# Patient Record
Sex: Female | Born: 1960 | Race: White | Hispanic: No | Marital: Married | Smoking: Never smoker
Health system: Southern US, Community
[De-identification: ages and names within clinical notes are randomized; demographics above are authoritative.]

## PROBLEM LIST (undated history)

## (undated) ENCOUNTER — Encounter

## (undated) ENCOUNTER — Encounter: Attending: Internal Medicine | Primary: Internal Medicine

## (undated) ENCOUNTER — Encounter
Attending: Pharmacist Clinician (PhC)/ Clinical Pharmacy Specialist | Primary: Pharmacist Clinician (PhC)/ Clinical Pharmacy Specialist

## (undated) ENCOUNTER — Telehealth

## (undated) ENCOUNTER — Encounter: Attending: Family | Primary: Family

## (undated) ENCOUNTER — Ambulatory Visit: Payer: PRIVATE HEALTH INSURANCE | Attending: Registered" | Primary: Registered"

## (undated) ENCOUNTER — Ambulatory Visit: Payer: PRIVATE HEALTH INSURANCE

## (undated) ENCOUNTER — Ambulatory Visit: Payer: PRIVATE HEALTH INSURANCE | Attending: Family | Primary: Family

## (undated) ENCOUNTER — Ambulatory Visit: Payer: Medicaid (Managed Care) | Attending: Internal Medicine | Primary: Internal Medicine

## (undated) ENCOUNTER — Ambulatory Visit

## (undated) ENCOUNTER — Telehealth
Attending: Pharmacist Clinician (PhC)/ Clinical Pharmacy Specialist | Primary: Pharmacist Clinician (PhC)/ Clinical Pharmacy Specialist

## (undated) ENCOUNTER — Ambulatory Visit: Payer: Medicaid (Managed Care)

## (undated) ENCOUNTER — Inpatient Hospital Stay: Payer: Medicaid (Managed Care)

## (undated) ENCOUNTER — Ambulatory Visit: Attending: Surgery | Primary: Surgery

## (undated) ENCOUNTER — Ambulatory Visit: Payer: PRIVATE HEALTH INSURANCE | Attending: Clinical | Primary: Clinical

## (undated) ENCOUNTER — Encounter
Attending: Student in an Organized Health Care Education/Training Program | Primary: Student in an Organized Health Care Education/Training Program

## (undated) DIAGNOSIS — E785 Hyperlipidemia, unspecified: Secondary | ICD-10-CM

## (undated) DIAGNOSIS — G4730 Sleep apnea, unspecified: Secondary | ICD-10-CM

## (undated) DIAGNOSIS — K219 Gastro-esophageal reflux disease without esophagitis: Secondary | ICD-10-CM

## (undated) DIAGNOSIS — E559 Vitamin D deficiency, unspecified: Secondary | ICD-10-CM

## (undated) HISTORY — PX: BREAST LUMPECTOMY: SHX2

## (undated) HISTORY — PX: GANGLION CYST EXCISION: SHX1691

## (undated) HISTORY — PX: CARPAL TUNNEL RELEASE: SHX101

## (undated) HISTORY — DX: Hyperlipidemia, unspecified: E78.5

## (undated) HISTORY — DX: Sleep apnea, unspecified: G47.30

## (undated) HISTORY — PX: ELBOW SURGERY: SHX618

## (undated) HISTORY — PX: HAND SURGERY: SHX662

## (undated) HISTORY — DX: Vitamin D deficiency, unspecified: E55.9

## (undated) HISTORY — PX: SHOULDER ARTHROSCOPY: SHX128

## (undated) HISTORY — PX: KNEE SURGERY: SHX244

## (undated) HISTORY — DX: Gastro-esophageal reflux disease without esophagitis: K21.9

---

## 2004-11-01 ENCOUNTER — Emergency Department (HOSPITAL_COMMUNITY): Admission: EM | Admit: 2004-11-01 | Discharge: 2004-11-01 | Payer: Self-pay | Admitting: Emergency Medicine

## 2006-01-25 ENCOUNTER — Encounter: Admission: RE | Admit: 2006-01-25 | Discharge: 2006-01-25 | Payer: Self-pay | Admitting: *Deleted

## 2006-02-26 ENCOUNTER — Encounter (INDEPENDENT_AMBULATORY_CARE_PROVIDER_SITE_OTHER): Payer: Self-pay | Admitting: *Deleted

## 2006-02-26 ENCOUNTER — Ambulatory Visit (HOSPITAL_COMMUNITY): Admission: RE | Admit: 2006-02-26 | Discharge: 2006-02-26 | Payer: Self-pay | Admitting: *Deleted

## 2006-07-20 ENCOUNTER — Emergency Department (HOSPITAL_COMMUNITY): Admission: EM | Admit: 2006-07-20 | Discharge: 2006-07-20 | Payer: Self-pay | Admitting: Emergency Medicine

## 2006-10-25 ENCOUNTER — Ambulatory Visit (HOSPITAL_BASED_OUTPATIENT_CLINIC_OR_DEPARTMENT_OTHER): Admission: RE | Admit: 2006-10-25 | Discharge: 2006-10-25 | Payer: Self-pay | Admitting: Orthopedic Surgery

## 2007-12-23 ENCOUNTER — Ambulatory Visit: Payer: Self-pay | Admitting: Gastroenterology

## 2007-12-23 DIAGNOSIS — R1011 Right upper quadrant pain: Secondary | ICD-10-CM

## 2007-12-23 DIAGNOSIS — R740 Nonspecific elevation of levels of transaminase and lactic acid dehydrogenase [LDH]: Secondary | ICD-10-CM

## 2007-12-24 LAB — CONVERTED CEMR LAB
ALT: 37 units/L — ABNORMAL HIGH (ref 0–35)
Alkaline Phosphatase: 50 units/L (ref 39–117)
Basophils Absolute: 0.1 10*3/uL (ref 0.0–0.1)
GFR calc non Af Amer: 71 mL/min
Glucose, Bld: 108 mg/dL — ABNORMAL HIGH (ref 70–99)
HCT: 39.4 % (ref 36.0–46.0)
Hemoglobin: 13.8 g/dL (ref 12.0–15.0)
IgG (Immunoglobin G), Serum: 1003 mg/dL (ref 694–1618)
MCHC: 35 g/dL (ref 30.0–36.0)
Monocytes Absolute: 0.6 10*3/uL (ref 0.1–1.0)
Neutro Abs: 5.5 10*3/uL (ref 1.4–7.7)
RDW: 12.3 % (ref 11.5–14.6)
Saturation Ratios: 25.8 % (ref 20.0–50.0)
Sodium: 140 meq/L (ref 135–145)
Total Bilirubin: 1 mg/dL (ref 0.3–1.2)
Total Protein: 6.8 g/dL (ref 6.0–8.3)
Transferrin: 277.1 mg/dL (ref >=212.0)
aPTT: 29.7 s (ref 21.7–29.8)

## 2007-12-25 ENCOUNTER — Ambulatory Visit: Payer: Self-pay | Admitting: Cardiology

## 2008-01-02 ENCOUNTER — Telehealth (INDEPENDENT_AMBULATORY_CARE_PROVIDER_SITE_OTHER): Payer: Self-pay | Admitting: *Deleted

## 2008-01-02 LAB — CONVERTED CEMR LAB
Anti Nuclear Antibody(ANA): NEGATIVE
Ceruloplasmin: 32 mg/dL (ref 21–63)
HCV Ab: NEGATIVE
Tissue Transglutaminase Ab, IgA: 0 units (ref <7)

## 2008-03-24 ENCOUNTER — Encounter: Payer: Self-pay | Admitting: Gastroenterology

## 2009-07-21 ENCOUNTER — Emergency Department (HOSPITAL_COMMUNITY): Admission: EM | Admit: 2009-07-21 | Discharge: 2009-07-21 | Payer: Self-pay | Admitting: Emergency Medicine

## 2009-08-30 ENCOUNTER — Telehealth: Payer: Self-pay | Admitting: Gastroenterology

## 2010-04-12 NOTE — Procedures (Signed)
Summary: EGD   EGD  Procedure date:  02/26/2006  Findings:      Location: [facility]   NAME:  Brenda Lamb, Brenda Lamb                ACCOUNT NO.:  [id number]   MEDICAL RECORD NO.:  [id number]          PATIENT TYPE:  AMB   LOCATION:  ENDO                         FACILITY:  [facility]   PHYSICIAN:  Georgiana Spinner, M.D.    DATE OF BIRTH:  1960-07-07   DATE OF PROCEDURE:  02/26/2006  DATE OF DISCHARGE:                               OPERATIVE REPORT   PROCEDURE:  Upper endoscopy with biopsy.   INDICATIONS:  Gastroesophageal reflux disease, question of esophageal  varices.   ANESTHESIA:  Fentanyl 75 mcg, Versed 8 mg.   PROCEDURE IN DETAIL:  With patient mildly sedated in the left lateral  decubitus position, the Pentax videoscopic endoscope was inserted into  the mouth, passed under direct vision through the esophagus which  appeared normal.  There was no evidence of varices noted in the stomach,  fundus, body, antrum, duodenal bulb, second portion of the duodenum  visualized.  From this point, the endoscope was slowly withdrawn taking  circumferential views of the duodenal mucosa until the endoscope was  then pulled back into the stomach, placed in retroflexion and viewed the  stomach from below.  Question of some irritation and erythema of the  squamocolumnar junction was noted and biopsied in retroflex view.  The  endoscope was then straightened and withdrawn.  The patient's vital  signs, pulse oximeter, remained stable.  Patient tolerated the procedure  well without apparent complications.   FINDINGS:  No evidence of gastric or esophageal varices.  Mild erythema  of the squamocolumnar junction biopsied.  Await biopsy report.   PLAN:  Patient will call in for results and follow up with me as an  outpatient.           ______________________________  Georgiana Spinner, M.D.     GMO/MEDQ  D:  02/26/2006  T:  02/26/2006  Job:  [id number]

## 2010-04-12 NOTE — Progress Notes (Signed)
Summary: Schedule Recall Office Visit  Phone Note Outgoing Call   Call placed by: Lamona Curl CMA Duncan Dull),  August 30, 2009 3:28 PM Call placed to: Patient Summary of Call: I have called to remind patient that she is overdue for follow up office visit. Patient states that she never got our letter but would like to go ahead and schedule a visit with [place] as she is now having increasing amounts of abdominal pain again. She states that she is on Workers Comp and is not sure she can afford to come. I have given her the number to [facility], our billing department to set up arrangements with them and we have set her up for an office visit on 09/10/09. Patient to call back should symptoms persist or worsen. Initial call taken by: Lamona Curl CMA (AAMA),  August 30, 2009 3:30 PM

## 2010-07-26 NOTE — Op Note (Signed)
NAMETAMMEE, THIELKE                ACCOUNT NO.:  [id number]   MEDICAL RECORD NO.:  [id number]          PATIENT TYPE:  AMB   LOCATION:  DSC                          FACILITY:  [facility]   PHYSICIAN:  Cindee Salt, M.D.       DATE OF BIRTH:  April 16, 1960   DATE OF PROCEDURE:  10/25/2006  DATE OF DISCHARGE:                               OPERATIVE REPORT   PREOPERATIVE DIAGNOSIS:  Stenosing tenosynovitis, left thumb.   POSTOPERATIVE DIAGNOSIS:  Stenosing tenosynovitis, left thumb.   OPERATION:  Release A1 pulley to the left thumb.   SURGEON:  Cindee Salt, M.D.   ANESTHESIA:  General.   ANESTHESIOLOGIST:  Janetta Hora. Gelene Mink, M.D.   HISTORY:  The patient is a 50 year old female with a history of  triggering of her left thumb not responsive to conservative treatment.  She has elected to proceed to have this surgically released.  She is  aware of risks and complications including infection, recurrence of  injury, damage to arteries, veins, tendons, complete relief of symptoms,  dystrophy.  She has elected to proceed.  She is aware of risks and  complications which have been thoroughly discussed.  Questions have been  encouraged and answered to her satisfaction in the preoperative area.  The patient is seen and the extremity marked by both patient and  surgeon.   PROCEDURE:  The patient is brought to the operating room where general  anesthetic was carried out without difficulty.  She was prepped using  DuraPrep, supine position, left arm free.  The limb was exsanguinated  with an Esmarch bandage.  Tourniquet placed on the forearm, was inflated  to 220 mmHg.  A transverse incision was made over the A1 pulley carried  down through subcutaneous tissue and retractors placed.  After  identification of radial and ulnar neurovascular bundles.  The A1 pulley  was found to be markedly thickened with a trigger thumb.  This was  released in its radial aspect.  The oblique pulley was left intact.   The  finger placed through a full range of motion, no further triggering was  noted.  The wound was irrigated.  The skin was closed with interrupted 5-  0 Vicryl repeat sutures.  Sterile compressive dressing was applied.  The  patient tolerated the procedure well.  On deflation of the tourniquet,  all fingers immediately pink.  She was taken to the recovery room for  observation in satisfactory condition.  She will be discharged home to  return to the hand surgery clinic in 1 week on Vicodin.           ______________________________  Cindee Salt, M.D.     GK/MEDQ  D:  10/25/2006  T:  10/25/2006  Job:  [id number]

## 2010-07-29 NOTE — Op Note (Signed)
NAMEDIAMONDS, LIPPARD                ACCOUNT NO.:  [id number]   MEDICAL RECORD NO.:  [id number]          PATIENT TYPE:  AMB   LOCATION:  ENDO                         FACILITY:  [facility]   PHYSICIAN:  Georgiana Spinner, M.D.    DATE OF BIRTH:  1960-07-31   DATE OF PROCEDURE:  02/26/2006  DATE OF DISCHARGE:                               OPERATIVE REPORT   PROCEDURE:  Upper endoscopy with biopsy.   INDICATIONS:  Gastroesophageal reflux disease, question of esophageal  varices.   ANESTHESIA:  Fentanyl 75 mcg, Versed 8 mg.   PROCEDURE IN DETAIL:  With patient mildly sedated in the left lateral  decubitus position, the Pentax videoscopic endoscope was inserted into  the mouth, passed under direct vision through the esophagus which  appeared normal.  There was no evidence of varices noted in the stomach,  fundus, body, antrum, duodenal bulb, second portion of the duodenum  visualized.  From this point, the endoscope was slowly withdrawn taking  circumferential views of the duodenal mucosa until the endoscope was  then pulled back into the stomach, placed in retroflexion and viewed the  stomach from below.  Question of some irritation and erythema of the  squamocolumnar junction was noted and biopsied in retroflex view.  The  endoscope was then straightened and withdrawn.  The patient's vital  signs, pulse oximeter, remained stable.  Patient tolerated the procedure  well without apparent complications.   FINDINGS:  No evidence of gastric or esophageal varices.  Mild erythema  of the squamocolumnar junction biopsied.  Await biopsy report.   PLAN:  Patient will call in for results and follow up with me as an  outpatient.           ______________________________  Georgiana Spinner, M.D.     GMO/MEDQ  D:  02/26/2006  T:  02/26/2006  Job:  [id number]

## 2014-04-13 DIAGNOSIS — E782 Mixed hyperlipidemia: Secondary | ICD-10-CM | POA: Insufficient documentation

## 2014-12-30 LAB — HM COLONOSCOPY

## 2015-07-06 DIAGNOSIS — R7303 Prediabetes: Secondary | ICD-10-CM | POA: Insufficient documentation

## 2015-09-08 DIAGNOSIS — K760 Fatty (change of) liver, not elsewhere classified: Secondary | ICD-10-CM | POA: Insufficient documentation

## 2015-10-12 HISTORY — PX: ESOPHAGOGASTRODUODENOSCOPY: SHX1529

## 2015-12-12 HISTORY — PX: ESOPHAGOGASTRODUODENOSCOPY: SHX1529

## 2016-09-21 MED ORDER — HYDROCODONE 5 MG-ACETAMINOPHEN 325 MG TABLET
ORAL_TABLET | Freq: Three times a day (TID) | ORAL | 0 refills | 0.00000 days | Status: CP | PRN
Start: 2016-09-21 — End: 2016-10-19

## 2016-10-18 ENCOUNTER — Ambulatory Visit: Admission: RE | Admit: 2016-10-18 | Discharge: 2016-10-18 | Payer: BC Managed Care – PPO

## 2016-10-18 DIAGNOSIS — M1711 Unilateral primary osteoarthritis, right knee: Principal | ICD-10-CM

## 2016-10-19 MED ORDER — HYDROCODONE 5 MG-ACETAMINOPHEN 325 MG TABLET
ORAL_TABLET | Freq: Three times a day (TID) | ORAL | 0 refills | 0 days | Status: CP | PRN
Start: 2016-10-19 — End: ?

## 2016-10-30 ENCOUNTER — Ambulatory Visit: Admission: RE | Admit: 2016-10-30 | Discharge: 2016-10-30 | Payer: BC Managed Care – PPO

## 2016-10-30 DIAGNOSIS — G8929 Other chronic pain: Secondary | ICD-10-CM

## 2016-10-30 DIAGNOSIS — M7552 Bursitis of left shoulder: Secondary | ICD-10-CM

## 2016-10-30 DIAGNOSIS — E119 Type 2 diabetes mellitus without complications: Secondary | ICD-10-CM

## 2016-10-30 DIAGNOSIS — M25512 Pain in left shoulder: Principal | ICD-10-CM

## 2016-10-30 DIAGNOSIS — Z9889 Other specified postprocedural states: Secondary | ICD-10-CM

## 2017-12-11 HISTORY — PX: REPLACEMENT TOTAL KNEE: SUR1224

## 2018-02-27 ENCOUNTER — Encounter: Admit: 2018-02-27 | Discharge: 2018-02-28 | Payer: PRIVATE HEALTH INSURANCE | Attending: Family | Primary: Family

## 2018-02-27 DIAGNOSIS — J069 Acute upper respiratory infection, unspecified: Secondary | ICD-10-CM

## 2018-02-27 DIAGNOSIS — H6982 Other specified disorders of Eustachian tube, left ear: Secondary | ICD-10-CM

## 2018-02-27 DIAGNOSIS — H9202 Otalgia, left ear: Principal | ICD-10-CM

## 2018-02-27 MED ORDER — IPRATROPIUM BROMIDE 42 MCG (0.06 %) NASAL SPRAY
Freq: Three times a day (TID) | NASAL | 0 refills | 0 days | Status: CP
Start: 2018-02-27 — End: 2019-02-27

## 2018-02-27 MED ORDER — AZITHROMYCIN 250 MG TABLET
ORAL_TABLET | 0 refills | 0 days | Status: CP
Start: 2018-02-27 — End: ?

## 2018-03-12 ENCOUNTER — Encounter: Admit: 2018-03-12 | Discharge: 2018-03-13 | Payer: PRIVATE HEALTH INSURANCE | Attending: Family | Primary: Family

## 2018-03-12 DIAGNOSIS — M542 Cervicalgia: Secondary | ICD-10-CM

## 2018-03-12 DIAGNOSIS — S29012A Strain of muscle and tendon of back wall of thorax, initial encounter: Principal | ICD-10-CM

## 2018-03-12 DIAGNOSIS — S46912A Strain of unspecified muscle, fascia and tendon at shoulder and upper arm level, left arm, initial encounter: Secondary | ICD-10-CM

## 2018-03-12 MED ORDER — NAPROXEN 500 MG TABLET
ORAL_TABLET | Freq: Two times a day (BID) | ORAL | 0 refills | 0 days | Status: CP
Start: 2018-03-12 — End: 2018-03-22

## 2018-03-29 DIAGNOSIS — M4712 Other spondylosis with myelopathy, cervical region: Secondary | ICD-10-CM | POA: Insufficient documentation

## 2019-03-26 ENCOUNTER — Ambulatory Visit (INDEPENDENT_AMBULATORY_CARE_PROVIDER_SITE_OTHER): Payer: 59 | Admitting: Orthopaedic Surgery

## 2019-03-26 ENCOUNTER — Ambulatory Visit (INDEPENDENT_AMBULATORY_CARE_PROVIDER_SITE_OTHER): Payer: 59

## 2019-03-26 ENCOUNTER — Encounter: Payer: Self-pay | Admitting: Orthopaedic Surgery

## 2019-03-26 ENCOUNTER — Ambulatory Visit: Payer: Self-pay

## 2019-03-26 VITALS — Ht 59.0 in | Wt 219.0 lb

## 2019-03-26 DIAGNOSIS — M654 Radial styloid tenosynovitis [de Quervain]: Secondary | ICD-10-CM

## 2019-03-26 DIAGNOSIS — M79642 Pain in left hand: Secondary | ICD-10-CM

## 2019-03-26 DIAGNOSIS — M79641 Pain in right hand: Secondary | ICD-10-CM | POA: Diagnosis not present

## 2019-03-26 MED ORDER — METHYLPREDNISOLONE ACETATE 40 MG/ML IJ SUSP
20.0000 mg | INTRAMUSCULAR | Status: AC | PRN
  Administered 2019-03-26: 20 mg

## 2019-03-26 MED ORDER — LIDOCAINE HCL 1 % IJ SOLN
1.0000 mL | INTRAMUSCULAR | Status: AC | PRN
  Administered 2019-03-26: 12:00:00 1 mL

## 2019-03-26 NOTE — Progress Notes (Signed)
Office Visit Note   Patient: Brenda Lamb           Date of Birth: 11-Sep-1960           MRN: [id number] Visit Date: 03/26/2019              Requested by: No referring provider defined for this encounter. PCP: System, Pcp Not In   Assessment & Plan: Visit Diagnoses:  1. Bilateral hand pain   2. De Quervain's disease (radial styloid tenosynovitis)     Plan: Bilateral de Quervain's tenosynovitis more symptomatic on the right.  Will inject first dorsal extensor compartment with cortisone and apply de Quervain's splint.  Try Voltaren gel.  Office 2 weeks to inject left side  Follow-Up Instructions: Return in about 2 weeks (around 04/09/2019).   Orders:  Orders Placed This Encounter  Procedures  . Hand/UE Inj  . XR Hand Complete Left  . XR Hand Complete Right   No orders of the defined types were placed in this encounter.     Procedures: Hand/UE Inj for de Quervain's tenosynovitis on 03/26/2019 11:51 AM Details: 27 G needle, dorsal approach Medications: 1 mL lidocaine 1 %; 20 mg methylPREDNISolone acetate 40 MG/ML      Clinical Data: No additional findings.   Subjective: Chief Complaint  Patient presents with  . Right Hand - Pain  . Left Hand - Pain  Patient presents today for bilateral hand pain. She said that the left side started in November, and the right side at Christmas of 2020. The pain shoots up her left arm, but stays localized in her right. Her fingers are triggering. She has been wearing braces and using wraps. She does not take anything for pain.  Having difficulty sleeping at night related to her pain.  She thinks the pain initiated as a result of making multiple Christmas wreaths over period of a month.  No numbness or tingling.  Pain is localized along the first dorsal extensor compartment of both wrists right greater than left  HPI  Review of Systems   Objective: Vital Signs: Ht 4\' 11"  (1.499 m)   Wt 219 lb (99.3 kg)   BMI 44.23 kg/m    Physical Exam Constitutional:      Appearance: She is well-developed.  Eyes:     Pupils: Pupils are equal, round, and reactive to light.  Pulmonary:     Effort: Pulmonary effort is normal.  Skin:    General: Skin is warm and dry.  Neurological:     Mental Status: She is alert and oriented to person, place, and time.  Psychiatric:        Behavior: Behavior normal.     Ortho Exam awake alert and oriented x3.  Comfortable sitting.  Pain over the first dorsal extensor compartment of both wrists right greater than left.  Positive Finkelstein's test.  No pain at the base of the thumb.  Skin intact motor intact.  Does have degenerative change at the DIP joint of the right index finger with radial deviation but without pain  Specialty Comments:  No specialty comments available.  Imaging: XR Hand Complete Left  Result Date: 03/26/2019 Films of the left wrist and hand were obtained in several projections.  No acute change or ectopic calcification.  Very minimal degenerative change at the base of the thumb carpometacarpal joint.  Patient has symptoms of de Quervain's without x-ray changes over the distal radius.  Also has some arthritis of the DIP joints which are  asymptomatic  XR Hand Complete Right  Result Date: 03/26/2019 Films of the right hand were obtained several projections.  No acute changes or ectopic calcification.  Very minimal degenerative change at the base of the thumb carpal metacarpal joint.  Radiocarpal joint intact.  Patient has symptoms of de Quervain's without bony changes    PMFS History: Patient Active Problem List   Diagnosis Date Noted  . De Quervain's disease (radial styloid tenosynovitis) 03/26/2019  . ABDOMINAL PAIN-RUQ 12/23/2007  . ABNORMAL TRANSAMINASE-LFT'S 12/23/2007   Past Medical History:  Diagnosis Date  . Acid reflux   . Sleep apnea     History reviewed. No pertinent family history.  Past Surgical History:  Procedure Laterality Date  .  CARPAL TUNNEL RELEASE Bilateral   . HAND SURGERY     trigger fingers  . KNEE SURGERY     right knee arthoplasty  . SHOULDER ARTHROSCOPY Bilateral    rotator cuff repairs   Social History   Occupational History  . Not on file  Tobacco Use  . Smoking status: Not on file  Substance and Sexual Activity  . Alcohol use: Not on file  . Drug use: Not on file  . Sexual activity: Not on file

## 2019-04-15 ENCOUNTER — Other Ambulatory Visit: Payer: Self-pay

## 2019-04-16 ENCOUNTER — Encounter: Payer: Self-pay | Admitting: Family Medicine

## 2019-04-16 ENCOUNTER — Ambulatory Visit (INDEPENDENT_AMBULATORY_CARE_PROVIDER_SITE_OTHER): Payer: 59 | Admitting: Family Medicine

## 2019-04-16 DIAGNOSIS — Z23 Encounter for immunization: Secondary | ICD-10-CM | POA: Diagnosis not present

## 2019-04-16 DIAGNOSIS — E782 Mixed hyperlipidemia: Secondary | ICD-10-CM | POA: Diagnosis not present

## 2019-04-16 DIAGNOSIS — Z130 Encounter for screening for diseases of the blood and blood-forming organs and certain disorders involving the immune mechanism: Secondary | ICD-10-CM | POA: Diagnosis not present

## 2019-04-16 MED ORDER — ROSUVASTATIN CALCIUM 10 MG PO TABS: 10.0000 mg | ORAL_TABLET | Freq: Every day | ORAL | 2 refills | Status: DC

## 2019-04-16 MED ORDER — PHENTERMINE HCL 37.5 MG PO CAPS: 37.5000 mg | ORAL_CAPSULE | ORAL | 2 refills | Status: DC

## 2019-04-16 MED ORDER — SHINGRIX 50 MCG/0.5ML IM SUSR: 0.5000 mL | Freq: Once | INTRAMUSCULAR | 0 refills | Status: AC

## 2019-04-16 NOTE — Progress Notes (Signed)
New Patient Office Visit  Assessment & Plan:  1. Morbid obesity ([place]) - Education provided on obesity. Encouraged continued diet and exercise. Starting phentermine today.  - phentermine 37.5 MG capsule; Take 1 capsule (37.5 mg total) by mouth every morning.  Dispense: 30 capsule; Refill: 2 - CBC with Differential/Platelet - CMP14+EGFR - Lipid panel  2. Mixed hyperlipidemia - rosuvastatin (CRESTOR) 10 MG tablet; Take 1 tablet (10 mg total) by mouth daily.  Dispense: 90 tablet; Refill: 2 - CMP14+EGFR - Lipid panel  3. Screening for deficiency anemia - CBC with Differential/Platelet  4. Immunization due - SHINGRIX injection; Inject 0.5 mLs into the muscle once for 1 dose.  Dispense: 0.5 mL; Refill: 0   Follow-up: Return in about 4 weeks (around 05/14/2019) for weight.   Hendricks Limes, MSN, APRN, FNP-C [facility]  Subjective:  Patient ID: Brenda Lamb, female    DOB: [birth year]  Age: 59 y.o. MRN: [id number]  Patient Care Team: Loman [place], FNP as PCP - General (Family Medicine)  CC:  Chief Complaint  Patient presents with  . New Patient (Initial Visit)    discuss diet medication  . Establish Care    HPI Brenda Lamb presents to establish care. Patient transferring from [facility] due to insurance.  Patient would like to discuss medication to aid in weight loss. She has taken phentermine in the past and was able to lose 40 pounds with it.  This was at some point within the past year but not within at least the past 6 months.  Patient is exercising almost daily.  If there are days that she misses she make sure she gets them at least 3 days a week.  She is working with weights, stairs, and walking.  She has also been working on her diet.  She does not eat any bread, potatoes, sweets, or sodas.  She is cooking in the air [place] so that she does not have to use grease or oils.  She only eats [place] once a week as it is her greatest  weakness.   Review of Systems  Constitutional: Negative for chills, fever, malaise/fatigue and weight loss.  HENT: Negative for congestion, ear discharge, ear pain, nosebleeds, sinus pain, sore throat and tinnitus.   Eyes: Negative for blurred vision, double vision, pain, discharge and redness.  Respiratory: Negative for cough, shortness of breath and wheezing.   Cardiovascular: Negative for chest pain, palpitations and leg swelling.  Gastrointestinal: Negative for abdominal pain, constipation, diarrhea, heartburn, nausea and vomiting.  Genitourinary: Negative for dysuria, frequency and urgency.  Musculoskeletal: Negative for myalgias.  Skin: Negative for rash.  Neurological: Negative for dizziness, seizures, weakness and headaches.  Psychiatric/Behavioral: Negative for depression, substance abuse and suicidal ideas. The patient is not nervous/anxious.     Current Outpatient Medications:  .  aspirin EC 81 MG tablet, Take 81 mg by mouth daily., Disp: , Rfl:  .  rosuvastatin (CRESTOR) 10 MG tablet, Take 1 tablet (10 mg total) by mouth daily., Disp: 90 tablet, Rfl: 2 .  phentermine 37.5 MG capsule, Take 1 capsule (37.5 mg total) by mouth every morning., Disp: 30 capsule, Rfl: 2  No Known Allergies  Past Medical History:  Diagnosis Date  . Acid reflux   . Hyperlipidemia   . Sleep apnea     Past Surgical History:  Procedure Laterality Date  . BREAST LUMPECTOMY     left breast lump  . CARPAL TUNNEL RELEASE Bilateral   .  HAND SURGERY     trigger fingers  . KNEE SURGERY     right knee arthoplasty  . SHOULDER ARTHROSCOPY Bilateral    rotator cuff repairs    Family History  Problem Relation Age of Onset  . Heart disease Mother   . COPD Father   . Ovarian cancer Sister   . Leukemia Sister 50  . Obesity Brother     Social History   Socioeconomic History  . Marital status: Married    Spouse name: Not on file  . Number of children: Not on file  . Years of education: Not  on file  . Highest education level: Not on file  Occupational History  . Not on file  Tobacco Use  . Smoking status: Never Smoker  . Smokeless tobacco: Never Used  Substance and Sexual Activity  . Alcohol use: Yes    Comment: social   . Drug use: Never  . Sexual activity: Not Currently  Other Topics Concern  . Not on file  Social History Narrative  . Not on file   Social Determinants of Health   Financial Resource Strain:   . Difficulty of Paying Living Expenses: Not on file  Food Insecurity:   . Worried About Charity fundraiser in the Last Year: Not on file  . Ran Out of Food in the Last Year: Not on file  Transportation Needs:   . Lack of Transportation (Medical): Not on file  . Lack of Transportation (Non-Medical): Not on file  Physical Activity:   . Days of Exercise per Week: Not on file  . Minutes of Exercise per Session: Not on file  Stress:   . Feeling of Stress : Not on file  Social Connections:   . Frequency of Communication with Friends and Family: Not on file  . Frequency of Social Gatherings with Friends and Family: Not on file  . Attends Religious Services: Not on file  . Active Member of Clubs or Organizations: Not on file  . Attends Archivist Meetings: Not on file  . Marital Status: Not on file  Intimate Partner Violence:   . Fear of Current or Ex-Partner: Not on file  . Emotionally Abused: Not on file  . Physically Abused: Not on file  . Sexually Abused: Not on file    Objective:   Today's Vitals: BP 127/81   Pulse 75   Temp 98.9 F (37.2 C) (Temporal)   Ht '4\' 11"'  (1.499 m)   Wt 220 lb (99.8 kg)   SpO2 97%   BMI 44.43 kg/m   Physical Exam Vitals reviewed.  Constitutional:      General: She is not in acute distress.    Appearance: Normal appearance. She is morbidly obese. She is not ill-appearing, toxic-appearing or diaphoretic.  HENT:     Head: Normocephalic and atraumatic.  Eyes:     General: No scleral icterus.        Right eye: No discharge.        Left eye: No discharge.     Conjunctiva/sclera: Conjunctivae normal.  Cardiovascular:     Rate and Rhythm: Normal rate and regular rhythm.     Heart sounds: Normal heart sounds. No murmur. No friction rub. No gallop.   Pulmonary:     Effort: Pulmonary effort is normal. No respiratory distress.     Breath sounds: Normal breath sounds. No stridor. No wheezing, rhonchi or rales.  Musculoskeletal:        General: Normal range  of motion.     Cervical back: Normal range of motion.  Skin:    General: Skin is warm and dry.     Capillary Refill: Capillary refill takes less than 2 seconds.  Neurological:     General: No focal deficit present.     Mental Status: She is alert and oriented to person, place, and time. Mental status is at baseline.  Psychiatric:        Mood and Affect: Mood normal.        Behavior: Behavior normal.        Thought Content: Thought content normal.        Judgment: Judgment normal.

## 2019-04-16 NOTE — Patient Instructions (Signed)

## 2019-04-17 LAB — CBC WITH DIFFERENTIAL/PLATELET
Basophils Absolute: 0 10*3/uL (ref 0.0–0.2)
Basos: 0 %
EOS (ABSOLUTE): 0.1 10*3/uL (ref 0.0–0.4)
Eos: 1 %
Hematocrit: 38.7 % (ref 34.0–46.6)
Hemoglobin: 13.4 g/dL (ref 11.1–15.9)
Immature Grans (Abs): 0 10*3/uL (ref 0.0–0.1)
Immature Granulocytes: 0 %
Lymphocytes Absolute: 2.5 10*3/uL (ref 0.7–3.1)
Lymphs: 39 %
MCH: 31 pg (ref 26.6–33.0)
MCHC: 34.6 g/dL (ref 31.5–35.7)
MCV: 90 fL (ref 79–97)
Monocytes Absolute: 0.5 10*3/uL (ref 0.1–0.9)
Monocytes: 7 %
Neutrophils Absolute: 3.3 10*3/uL (ref 1.4–7.0)
Neutrophils: 53 %
Platelets: 172 10*3/uL (ref 150–450)
RBC: 4.32 x10E6/uL (ref 3.77–5.28)
RDW: 12.9 % (ref 11.7–15.4)
WBC: 6.3 10*3/uL (ref 3.4–10.8)

## 2019-04-17 LAB — CMP14+EGFR
ALT: 20 IU/L (ref 0–32)
AST: 17 IU/L (ref 0–40)
Albumin/Globulin Ratio: 2.3 — ABNORMAL HIGH (ref 1.2–2.2)
Albumin: 4.4 g/dL (ref 3.8–4.9)
Alkaline Phosphatase: 69 IU/L (ref 39–117)
BUN/Creatinine Ratio: 14 (ref 9–23)
BUN: 13 mg/dL (ref 6–24)
Bilirubin Total: 0.9 mg/dL (ref 0.0–1.2)
CO2: 25 mmol/L (ref 20–29)
Calcium: 9.3 mg/dL (ref 8.7–10.2)
Chloride: 105 mmol/L (ref 96–106)
Creatinine, Ser: 0.9 mg/dL (ref 0.57–1.00)
GFR calc Af Amer: 82 mL/min/{1.73_m2} (ref >59)
GFR calc non Af Amer: 71 mL/min/{1.73_m2} (ref >59)
Globulin, Total: 1.9 g/dL (ref 1.5–4.5)
Glucose: 87 mg/dL (ref 65–99)
Potassium: 4 mmol/L (ref 3.5–5.2)
Sodium: 144 mmol/L (ref 134–144)
Total Protein: 6.3 g/dL (ref 6.0–8.5)

## 2019-04-17 LAB — LIPID PANEL
Chol/HDL Ratio: 3.7 ratio (ref 0.0–4.4)
Cholesterol, Total: 178 mg/dL (ref 100–199)
HDL: 48 mg/dL (ref >39)
LDL Chol Calc (NIH): 101 mg/dL — ABNORMAL HIGH (ref 0–99)
Triglycerides: 166 mg/dL — ABNORMAL HIGH (ref 0–149)
VLDL Cholesterol Cal: 29 mg/dL (ref 5–40)

## 2019-04-18 ENCOUNTER — Encounter: Payer: Self-pay | Admitting: Family Medicine

## 2019-05-12 ENCOUNTER — Other Ambulatory Visit: Payer: Self-pay

## 2019-05-12 NOTE — Progress Notes (Signed)
Assessment & Plan:  1. Morbid obesity (HCC) - Patient has not had any weight loss with phentermine, but actually gained 2 lbs.  We discussed that if she continues to not lose any weight the medication will be discontinued.  I have added Topamax to help her with the weight loss.  She will continue diet and exercise. - topiramate (TOPAMAX) 25 MG tablet; Take 1 tablet (25 mg total) by mouth daily.  Dispense: 30 tablet; Refill: 1  2. Oral lesion - triamcinolone (KENALOG) 0.1 % paste; Use as directed 1 application in the mouth or throat 2 (two) times daily.  Dispense: 5 g; Refill: 1  3. Dry mouth - chlorhexidine (PERIDEX) 0.12 % solution; Use as directed 15 mLs in the mouth or throat daily.  Dispense: 473 mL; Refill: 2   Return in about 4 weeks (around 06/10/2019) for weight.  Brenda Boston, MSN, APRN, FNP-C [facility]  Subjective:    Patient ID: Brenda Lamb, female    DOB: Jan 25, 1961, 59 y.o.   MRN: [id number]  Patient Care Team: Gwenlyn Fudge, FNP as PCP - General (Family Medicine)   Chief Complaint:  Chief Complaint  Patient presents with  . Weight Check    4 week re check     HPI: Brenda Lamb is a 59 y.o. female presenting on 05/13/2019 for Weight Check (4 week re check )  Patient is here for a follow-up of her weight.  She was started on phentermine on 04/16/2019.  She was encouraged to continue exercising and healthy eating as she was already doing well with this.  At her visit on 04/16/2019 she was 220 lbs with a BMI of 44.41. Today she is 222 lbs.   New complaints: Patient reports she used to have a prescription for chlorhexidine mouthwash that she used for a dry mouth and sores in her mouth.  She is requesting a refill of this today.  She currently has a lesion on the inside of her lower lip.  Social history:  Relevant past medical, surgical, family and social history reviewed and updated as indicated. Interim medical history since our last  visit reviewed.  Allergies and medications reviewed and updated.  DATA REVIEWED: CHART IN EPIC  ROS: Negative unless specifically indicated above in HPI.    Current Outpatient Medications:  .  aspirin EC 81 MG tablet, Take 81 mg by mouth daily., Disp: , Rfl:  .  gabapentin (NEURONTIN) 300 MG capsule, Take 1 capsule by mouth at bedtime., Disp: , Rfl:  .  phentermine 37.5 MG capsule, Take 1 capsule (37.5 mg total) by mouth every morning., Disp: 30 capsule, Rfl: 2 .  rosuvastatin (CRESTOR) 10 MG tablet, Take 1 tablet (10 mg total) by mouth daily., Disp: 90 tablet, Rfl: 2 .  chlorhexidine (PERIDEX) 0.12 % solution, Use as directed 15 mLs in the mouth or throat daily., Disp: 473 mL, Rfl: 2 .  topiramate (TOPAMAX) 25 MG tablet, Take 1 tablet (25 mg total) by mouth daily., Disp: 30 tablet, Rfl: 1 .  triamcinolone (KENALOG) 0.1 % paste, Use as directed 1 application in the mouth or throat 2 (two) times daily., Disp: 5 g, Rfl: 1   No Known Allergies Past Medical History:  Diagnosis Date  . Acid reflux   . Hyperlipidemia   . Sleep apnea     Past Surgical History:  Procedure Laterality Date  . BREAST LUMPECTOMY     left breast lump  . CARPAL TUNNEL RELEASE Bilateral   .  HAND SURGERY     trigger fingers  . KNEE SURGERY     right knee arthoplasty  . SHOULDER ARTHROSCOPY Bilateral    rotator cuff repairs    Social History   Socioeconomic History  . Marital status: Married    Spouse name: Not on file  . Number of children: Not on file  . Years of education: Not on file  . Highest education level: Not on file  Occupational History  . Not on file  Tobacco Use  . Smoking status: Never Smoker  . Smokeless tobacco: Never Used  Substance and Sexual Activity  . Alcohol use: Yes    Comment: social   . Drug use: Never  . Sexual activity: Not Currently  Other Topics Concern  . Not on file  Social History Narrative  . Not on file   Social Determinants of Health   Financial  Resource Strain:   . Difficulty of Paying Living Expenses: Not on file  Food Insecurity:   . Worried About Programme researcher, broadcasting/film/video in the Last Year: Not on file  . Ran Out of Food in the Last Year: Not on file  Transportation Needs:   . Lack of Transportation (Medical): Not on file  . Lack of Transportation (Non-Medical): Not on file  Physical Activity:   . Days of Exercise per Week: Not on file  . Minutes of Exercise per Session: Not on file  Stress:   . Feeling of Stress : Not on file  Social Connections:   . Frequency of Communication with Friends and Family: Not on file  . Frequency of Social Gatherings with Friends and Family: Not on file  . Attends Religious Services: Not on file  . Active Member of Clubs or Organizations: Not on file  . Attends Banker Meetings: Not on file  . Marital Status: Not on file  Intimate Partner Violence:   . Fear of Current or Ex-Partner: Not on file  . Emotionally Abused: Not on file  . Physically Abused: Not on file  . Sexually Abused: Not on file        Objective:    BP 127/80   Pulse 71   Temp 98.4 F (36.9 C) (Temporal)   Ht 4\' 11"  (1.499 m)   Wt 222 lb 9.6 oz (101 kg)   SpO2 100%   BMI 44.96 kg/m   Physical Exam Vitals reviewed.  Constitutional:      General: She is not in acute distress.    Appearance: Normal appearance. She is morbidly obese. She is not ill-appearing, toxic-appearing or diaphoretic.  HENT:     Head: Normocephalic and atraumatic.     Mouth/Throat:     Mouth: Oral lesions (inside lower lip x1) present.  Eyes:     General: No scleral icterus.       Right eye: No discharge.        Left eye: No discharge.     Conjunctiva/sclera: Conjunctivae normal.  Cardiovascular:     Rate and Rhythm: Normal rate.  Pulmonary:     Effort: Pulmonary effort is normal. No respiratory distress.  Musculoskeletal:        General: Normal range of motion.     Cervical back: Normal range of motion.  Skin:    General:  Skin is warm and dry.     Capillary Refill: Capillary refill takes less than 2 seconds.  Neurological:     General: No focal deficit present.     Mental  Status: She is alert and oriented to person, place, and time. Mental status is at baseline.  Psychiatric:        Mood and Affect: Mood normal.        Behavior: Behavior normal.        Thought Content: Thought content normal.        Judgment: Judgment normal.     No results found for: TSH Lab Results  Component Value Date   WBC 6.3 04/16/2019   HGB 13.4 04/16/2019   HCT 38.7 04/16/2019   MCV 90 04/16/2019   PLT 172 04/16/2019   Lab Results  Component Value Date   NA 144 04/16/2019   K 4.0 04/16/2019   CO2 25 04/16/2019   GLUCOSE 87 04/16/2019   BUN 13 04/16/2019   CREATININE 0.90 04/16/2019   BILITOT 0.9 04/16/2019   ALKPHOS 69 04/16/2019   AST 17 04/16/2019   ALT 20 04/16/2019   PROT 6.3 04/16/2019   ALBUMIN 4.4 04/16/2019   CALCIUM 9.3 04/16/2019   Lab Results  Component Value Date   CHOL 178 04/16/2019   Lab Results  Component Value Date   HDL 48 04/16/2019   Lab Results  Component Value Date   LDLCALC 101 (H) 04/16/2019   Lab Results  Component Value Date   TRIG 166 (H) 04/16/2019   Lab Results  Component Value Date   CHOLHDL 3.7 04/16/2019   No results found for: HGBA1C

## 2019-05-13 ENCOUNTER — Ambulatory Visit (INDEPENDENT_AMBULATORY_CARE_PROVIDER_SITE_OTHER): Payer: 59 | Admitting: Family Medicine

## 2019-05-13 ENCOUNTER — Encounter: Payer: Self-pay | Admitting: Family Medicine

## 2019-05-13 DIAGNOSIS — K1370 Unspecified lesions of oral mucosa: Secondary | ICD-10-CM | POA: Diagnosis not present

## 2019-05-13 DIAGNOSIS — R682 Dry mouth, unspecified: Secondary | ICD-10-CM | POA: Diagnosis not present

## 2019-05-13 MED ORDER — CHLORHEXIDINE GLUCONATE 0.12 % MT SOLN: 15.0000 mL | Freq: Every day | OROMUCOSAL | 2 refills | Status: DC

## 2019-05-13 MED ORDER — TOPIRAMATE 25 MG PO TABS: 25.0000 mg | ORAL_TABLET | Freq: Every day | ORAL | 1 refills | Status: DC

## 2019-05-13 MED ORDER — TRIAMCINOLONE ACETONIDE 0.1 % MT PSTE: 1.0000 "application " | PASTE | Freq: Two times a day (BID) | OROMUCOSAL | 1 refills | Status: DC

## 2019-05-15 ENCOUNTER — Encounter: Payer: Self-pay | Admitting: Family Medicine

## 2019-05-15 DIAGNOSIS — R682 Dry mouth, unspecified: Secondary | ICD-10-CM | POA: Insufficient documentation

## 2019-05-16 ENCOUNTER — Ambulatory Visit: Payer: 59 | Admitting: Family Medicine

## 2019-06-07 ENCOUNTER — Other Ambulatory Visit: Payer: Self-pay | Admitting: Family Medicine

## 2019-06-24 ENCOUNTER — Ambulatory Visit: Payer: 59 | Admitting: Family Medicine

## 2019-07-05 ENCOUNTER — Other Ambulatory Visit: Payer: Self-pay | Admitting: Family Medicine

## 2019-08-02 ENCOUNTER — Other Ambulatory Visit: Payer: Self-pay | Admitting: Family Medicine

## 2019-08-06 ENCOUNTER — Encounter: Payer: Self-pay | Admitting: Family Medicine

## 2019-09-02 ENCOUNTER — Other Ambulatory Visit: Payer: Self-pay | Admitting: Family Medicine

## 2019-09-10 ENCOUNTER — Other Ambulatory Visit: Payer: Self-pay

## 2019-09-10 ENCOUNTER — Ambulatory Visit (INDEPENDENT_AMBULATORY_CARE_PROVIDER_SITE_OTHER): Payer: 59 | Admitting: Family Medicine

## 2019-09-10 ENCOUNTER — Encounter: Payer: Self-pay | Admitting: Family Medicine

## 2019-09-10 VITALS — BP 135/81 | HR 99 | Temp 98.3°F | Ht 59.0 in | Wt 231.6 lb

## 2019-09-10 DIAGNOSIS — K1379 Other lesions of oral mucosa: Secondary | ICD-10-CM

## 2019-09-10 DIAGNOSIS — K760 Fatty (change of) liver, not elsewhere classified: Secondary | ICD-10-CM

## 2019-09-10 DIAGNOSIS — R1011 Right upper quadrant pain: Secondary | ICD-10-CM | POA: Diagnosis not present

## 2019-09-10 DIAGNOSIS — R61 Generalized hyperhidrosis: Secondary | ICD-10-CM

## 2019-09-10 DIAGNOSIS — R7303 Prediabetes: Secondary | ICD-10-CM

## 2019-09-10 DIAGNOSIS — E782 Mixed hyperlipidemia: Secondary | ICD-10-CM | POA: Diagnosis not present

## 2019-09-10 LAB — BAYER DCA HB A1C WAIVED: HB A1C (BAYER DCA - WAIVED): 6.2 % (ref <7.0)

## 2019-09-10 NOTE — Patient Instructions (Addendum)
Biotene for dry mouth.   Gallbladder Eating Plan If you have a gallbladder condition, you may have trouble digesting fats. Eating a low-fat diet can help reduce your symptoms, and may be helpful before and after having surgery to remove your gallbladder (cholecystectomy). Your health care provider may recommend that you work with a diet and nutrition specialist (dietitian) to help you reduce the amount of fat in your diet. What are tips for following this plan? General guidelines  Limit your fat intake to less than 30% of your total daily calories. If you eat around 1,800 calories each day, this is less than 60 grams (g) of fat per day.  Fat is an important part of a healthy diet. Eating a low-fat diet can make it hard to maintain a healthy body weight. Ask your dietitian how much fat, calories, and other nutrients you need each day.  Eat small, frequent meals throughout the day instead of three large meals.  Drink at least 8-10 cups of fluid a day. Drink enough fluid to keep your urine clear or pale yellow.  Limit alcohol intake to no more than 1 drink a day for nonpregnant women and 2 drinks a day for men. One drink equals 12 oz of beer, 5 oz of wine, or 1 oz of hard liquor. Reading food labels  Check Nutrition Facts on food labels for the amount of fat per serving. Choose foods with less than 3 grams of fat per serving. Shopping  Choose nonfat and low-fat healthy foods. Look for the words "nonfat," "low fat," or "fat free."  Avoid buying processed or prepackaged foods. Cooking  Cook using low-fat methods, such as baking, broiling, grilling, or boiling.  Cook with small amounts of healthy fats, such as olive oil, grapeseed oil, canola oil, or sunflower oil. What foods are recommended?   All fresh, frozen, or canned fruits and vegetables.  Whole grains.  Low-fat or non-fat (skim) milk and yogurt.  Lean meat, skinless poultry, fish, eggs, and beans.  Low-fat protein  supplement powders or drinks.  Spices and herbs. What foods are not recommended?  High-fat foods. These include baked goods, fast food, fatty cuts of meat, ice cream, french toast, sweet rolls, pizza, cheese bread, foods covered with butter, creamy sauces, or cheese.  Fried foods. These include french fries, tempura, battered fish, breaded chicken, fried breads, and sweets.  Foods with strong odors.  Foods that cause bloating and gas. Summary  A low-fat diet can be helpful if you have a gallbladder condition, or before and after gallbladder surgery.  Limit your fat intake to less than 30% of your total daily calories. This is about 60 g of fat if you eat 1,800 calories each day.  Eat small, frequent meals throughout the day instead of three large meals. This information is not intended to replace advice given to you by your health care provider. Make sure you discuss any questions you have with your health care provider. Document Revised: 06/20/2018 Document Reviewed: 04/06/2016 Elsevier Patient Education  2020 [employer].

## 2019-09-10 NOTE — Progress Notes (Signed)
Assessment & Plan:  1. RUQ pain - Education provided on a gallbladder eating plan. - US Abdomen Limited RUQ; Future - CMP14+EGFR  2. Morbid obesity ([place]) - Diet and exercise encouraged. - CBC with Differential/Platelet - CMP14+EGFR - Lipid panel - TSH  3. Mouth sores - Encouraged to try Biotene for dry mouth. - Vitamin B12 - CBC with Differential/Platelet - CMP14+EGFR  4. Mixed hyperlipidemia - Lipid panel  5. Excessive sweating - TSH  6. Fatty liver - CMP14+EGFR  7. Pre-diabetes - Bayer DCA Hb A1c Waived   Return as directed after labs result.  Hendricks Limes, MSN, APRN, FNP-C [facility]  Subjective:    Patient ID: Brenda Lamb, female    DOB: Sep 28, 1960, 59 y.o.   MRN: [id number]  Patient Care Team: Loman Brooklyn, FNP as PCP - General (Family Medicine)   Chief Complaint:  Chief Complaint  Patient presents with  . Weight Check    3 month follow up of chronic medical conditions  . Flank Pain    Patient states she has been having right sided flank pain that has been going on a few weeks.  Worse when eating.    HPI: Brenda Lamb is a 59 y.o. female presenting on 09/10/2019 for Weight Check (3 month follow up of chronic medical conditions) and Flank Pain (Patient states she has been having right sided flank pain that has been going on a few weeks.  Worse when eating.)  Patient is here for follow-up of her weight.  She reports she quit taking the phentermine due to a dry mouth.  She feels like her tongue is very sore.  She wonders if she is biting it at night.  She does wear a CPAP for sleep apnea.  New complaints: Patient's concern is right upper quadrant pain.  Reports it bothers her all the time but is worse after meals such as pasta, pork chops, and hamburgers.   Social history:  Relevant past medical, surgical, family and social history reviewed and updated as indicated. Interim medical history since our last visit  reviewed.  Allergies and medications reviewed and updated.  DATA REVIEWED: CHART IN EPIC  ROS: Negative unless specifically indicated above in HPI.    Current Outpatient Medications:  .  aspirin EC 81 MG tablet, Take 81 mg by mouth daily., Disp: , Rfl:  .  rosuvastatin (CRESTOR) 10 MG tablet, Take 1 tablet (10 mg total) by mouth daily., Disp: 90 tablet, Rfl: 2   Allergies  Allergen Reactions  . Ranitidine Swelling    Swollen lips.   Past Medical History:  Diagnosis Date  . Acid reflux   . Hyperlipidemia   . Sleep apnea   . Vitamin D deficiency     Past Surgical History:  Procedure Laterality Date  . BREAST LUMPECTOMY     left breast lump  . CARPAL TUNNEL RELEASE Bilateral   . HAND SURGERY     trigger fingers  . KNEE SURGERY     right knee arthoplasty  . SHOULDER ARTHROSCOPY Bilateral    rotator cuff repairs    Social History   Socioeconomic History  . Marital status: Married    Spouse name: Not on file  . Number of children: Not on file  . Years of education: Not on file  . Highest education level: Not on file  Occupational History  . Not on file  Tobacco Use  . Smoking status: Never Smoker  . Smokeless tobacco: Never Used  Vaping Use  . Vaping Use: Never used  Substance and Sexual Activity  . Alcohol use: Yes    Comment: social   . Drug use: Never  . Sexual activity: Not Currently  Other Topics Concern  . Not on file  Social History Narrative  . Not on file   Social Determinants of Health   Financial Resource Strain:   . Difficulty of Paying Living Expenses:   Food Insecurity:   . Worried About Charity fundraiser in the Last Year:   . Arboriculturist in the Last Year:   Transportation Needs:   . Film/video editor (Medical):   Marland Kitchen Lack of Transportation (Non-Medical):   Physical Activity:   . Days of Exercise per Week:   . Minutes of Exercise per Session:   Stress:   . Feeling of Stress :   Social Connections:   . Frequency of  Communication with Friends and Family:   . Frequency of Social Gatherings with Friends and Family:   . Attends Religious Services:   . Active Member of Clubs or Organizations:   . Attends Archivist Meetings:   Marland Kitchen Marital Status:   Intimate Partner Violence:   . Fear of Current or Ex-Partner:   . Emotionally Abused:   Marland Kitchen Physically Abused:   . Sexually Abused:         Objective:    BP 135/81   Pulse 99   Temp 98.3 F (36.8 C) (Temporal)   Ht '4\' 11"'  (1.499 m)   Wt 231 lb 9.6 oz (105.1 kg)   SpO2 97%   BMI 46.78 kg/m   Wt Readings from Last 3 Encounters:  09/10/19 231 lb 9.6 oz (105.1 kg)  05/13/19 222 lb 9.6 oz (101 kg)  04/16/19 220 lb (99.8 kg)    Physical Exam Vitals reviewed.  Constitutional:      General: She is not in acute distress.    Appearance: Normal appearance. She is morbidly obese. She is not ill-appearing, toxic-appearing or diaphoretic.  HENT:     Head: Normocephalic and atraumatic.  Eyes:     General: No scleral icterus.       Right eye: No discharge.        Left eye: No discharge.     Conjunctiva/sclera: Conjunctivae normal.  Cardiovascular:     Rate and Rhythm: Normal rate and regular rhythm.     Heart sounds: Normal heart sounds. No murmur heard.  No friction rub. No gallop.   Pulmonary:     Effort: Pulmonary effort is normal. No respiratory distress.     Breath sounds: Normal breath sounds. No stridor. No wheezing, rhonchi or rales.  Musculoskeletal:        General: Normal range of motion.     Cervical back: Normal range of motion.  Skin:    General: Skin is warm and dry.     Capillary Refill: Capillary refill takes less than 2 seconds.  Neurological:     General: No focal deficit present.     Mental Status: She is alert and oriented to person, place, and time. Mental status is at baseline.  Psychiatric:        Mood and Affect: Mood normal.        Behavior: Behavior normal.        Thought Content: Thought content normal.         Judgment: Judgment normal.     Lab Results  Component Value Date   TSH  1.990 09/10/2019   Lab Results  Component Value Date   WBC 5.8 09/10/2019   HGB 12.9 09/10/2019   HCT 37.9 09/10/2019   MCV 91 09/10/2019   PLT 168 09/10/2019   Lab Results  Component Value Date   NA 140 09/10/2019   K 4.2 09/10/2019   CO2 23 09/10/2019   GLUCOSE 130 (H) 09/10/2019   BUN 14 09/10/2019   CREATININE 0.89 09/10/2019   BILITOT 0.7 09/10/2019   ALKPHOS 71 09/10/2019   AST 26 09/10/2019   ALT 25 09/10/2019   PROT 6.5 09/10/2019   ALBUMIN 4.4 09/10/2019   CALCIUM 9.0 09/10/2019   Lab Results  Component Value Date   CHOL 160 09/10/2019   Lab Results  Component Value Date   HDL 37 (L) 09/10/2019   Lab Results  Component Value Date   LDLCALC 82 09/10/2019   Lab Results  Component Value Date   TRIG 250 (H) 09/10/2019   Lab Results  Component Value Date   CHOLHDL 4.3 09/10/2019   Lab Results  Component Value Date   HGBA1C 6.2 09/10/2019

## 2019-09-11 LAB — LIPID PANEL
Chol/HDL Ratio: 4.3 ratio (ref 0.0–4.4)
Cholesterol, Total: 160 mg/dL (ref 100–199)
HDL: 37 mg/dL — ABNORMAL LOW (ref >39)
LDL Chol Calc (NIH): 82 mg/dL (ref 0–99)
Triglycerides: 250 mg/dL — ABNORMAL HIGH (ref 0–149)
VLDL Cholesterol Cal: 41 mg/dL — ABNORMAL HIGH (ref 5–40)

## 2019-09-11 LAB — CMP14+EGFR
ALT: 25 IU/L (ref 0–32)
AST: 26 IU/L (ref 0–40)
Albumin/Globulin Ratio: 2.1 (ref 1.2–2.2)
Albumin: 4.4 g/dL (ref 3.8–4.9)
Alkaline Phosphatase: 71 IU/L (ref 48–121)
BUN/Creatinine Ratio: 16 (ref 9–23)
BUN: 14 mg/dL (ref 6–24)
Bilirubin Total: 0.7 mg/dL (ref 0.0–1.2)
CO2: 23 mmol/L (ref 20–29)
Calcium: 9 mg/dL (ref 8.7–10.2)
Chloride: 105 mmol/L (ref 96–106)
Creatinine, Ser: 0.89 mg/dL (ref 0.57–1.00)
GFR calc Af Amer: 82 mL/min/{1.73_m2} (ref >59)
GFR calc non Af Amer: 71 mL/min/{1.73_m2} (ref >59)
Globulin, Total: 2.1 g/dL (ref 1.5–4.5)
Glucose: 130 mg/dL — ABNORMAL HIGH (ref 65–99)
Potassium: 4.2 mmol/L (ref 3.5–5.2)
Sodium: 140 mmol/L (ref 134–144)
Total Protein: 6.5 g/dL (ref 6.0–8.5)

## 2019-09-11 LAB — CBC WITH DIFFERENTIAL/PLATELET
Basophils Absolute: 0 10*3/uL (ref 0.0–0.2)
Basos: 1 %
EOS (ABSOLUTE): 0.1 10*3/uL (ref 0.0–0.4)
Eos: 2 %
Hematocrit: 37.9 % (ref 34.0–46.6)
Hemoglobin: 12.9 g/dL (ref 11.1–15.9)
Immature Grans (Abs): 0.1 10*3/uL (ref 0.0–0.1)
Immature Granulocytes: 1 %
Lymphocytes Absolute: 2.1 10*3/uL (ref 0.7–3.1)
Lymphs: 36 %
MCH: 30.8 pg (ref 26.6–33.0)
MCHC: 34 g/dL (ref 31.5–35.7)
MCV: 91 fL (ref 79–97)
Monocytes Absolute: 0.7 10*3/uL (ref 0.1–0.9)
Monocytes: 12 %
Neutrophils Absolute: 2.9 10*3/uL (ref 1.4–7.0)
Neutrophils: 48 %
Platelets: 168 10*3/uL (ref 150–450)
RBC: 4.19 x10E6/uL (ref 3.77–5.28)
RDW: 13.3 % (ref 11.7–15.4)
WBC: 5.8 10*3/uL (ref 3.4–10.8)

## 2019-09-11 LAB — TSH: TSH: 1.99 u[IU]/mL (ref 0.450–4.500)

## 2019-09-11 LAB — VITAMIN B12: Vitamin B-12: 442 pg/mL (ref 232–1245)

## 2019-09-15 ENCOUNTER — Encounter: Payer: Self-pay | Admitting: Family Medicine

## 2019-09-29 ENCOUNTER — Other Ambulatory Visit: Payer: Self-pay

## 2019-09-29 ENCOUNTER — Ambulatory Visit (HOSPITAL_COMMUNITY)
Admission: RE | Admit: 2019-09-29 | Discharge: 2019-09-29 | Disposition: A | Discharge status: Home / Self Care | Payer: 59 | Source: Ambulatory Visit | Attending: Family Medicine | Admitting: Family Medicine

## 2019-09-29 DIAGNOSIS — R1011 Right upper quadrant pain: Secondary | ICD-10-CM | POA: Insufficient documentation

## 2019-09-30 ENCOUNTER — Other Ambulatory Visit: Payer: Self-pay | Admitting: Family Medicine

## 2019-09-30 ENCOUNTER — Encounter: Payer: Self-pay | Admitting: Family Medicine

## 2019-09-30 DIAGNOSIS — R1011 Right upper quadrant pain: Secondary | ICD-10-CM

## 2019-09-30 DIAGNOSIS — K760 Fatty (change of) liver, not elsewhere classified: Secondary | ICD-10-CM

## 2019-09-30 HISTORY — DX: Fatty (change of) liver, not elsewhere classified: K76.0

## 2019-10-06 ENCOUNTER — Encounter: Payer: Self-pay | Admitting: Internal Medicine

## 2019-10-22 ENCOUNTER — Ambulatory Visit (INDEPENDENT_AMBULATORY_CARE_PROVIDER_SITE_OTHER): Payer: 59 | Admitting: Gastroenterology

## 2019-10-22 ENCOUNTER — Encounter: Payer: Self-pay | Admitting: *Deleted

## 2019-10-22 ENCOUNTER — Other Ambulatory Visit: Payer: Self-pay

## 2019-10-22 ENCOUNTER — Encounter: Payer: Self-pay | Admitting: Gastroenterology

## 2019-10-22 VITALS — BP 135/78 | HR 93 | Temp 97.5°F | Ht 59.0 in | Wt 233.0 lb

## 2019-10-22 DIAGNOSIS — K5900 Constipation, unspecified: Secondary | ICD-10-CM | POA: Diagnosis not present

## 2019-10-22 DIAGNOSIS — R161 Splenomegaly, not elsewhere classified: Secondary | ICD-10-CM | POA: Diagnosis not present

## 2019-10-22 DIAGNOSIS — R1011 Right upper quadrant pain: Secondary | ICD-10-CM | POA: Insufficient documentation

## 2019-10-22 DIAGNOSIS — K760 Fatty (change of) liver, not elsewhere classified: Secondary | ICD-10-CM | POA: Diagnosis not present

## 2019-10-22 DIAGNOSIS — I868 Varicose veins of other specified sites: Secondary | ICD-10-CM | POA: Insufficient documentation

## 2019-10-22 NOTE — Progress Notes (Signed)
Primary Care Physician:  Gwenlyn Fudge, FNP  Primary Gastroenterologist:  Roetta Sessions, MD   Chief Complaint  Patient presents with  . Abdominal Pain    ruq, stated knows she has fatty liver but that's not the problem    HPI:  Brenda Lamb is a 59 y.o. female here at the request of Shon Hale, FNP for further evaluation of right upper quadrant pain.  Patient has had RUQ pain for decades. She has been seen by GI in [place], Dr. Sabino Gasser in [facility], and most recently Dr. Rob Bunting at [facility] (2009). Per Dr. Christella Hartigan records: she had colonoscopy and upper endoscopy in 2006 which were normal except mild diverticulosis. Random biopsies were negative. In 2007, Dr. Virginia Rochester did a CT and upper endoscopy. 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 There was erythema in her GEJ, biopsies negative. Dr. Christella Hartigan repeated her CT October 2009 which showed mild fatty liver, slightly prominent spleen and varices again noted within the left upper quadrant with splenorenal shunt as previously identified.  Patient had HIDA scan July 2017: Gallbladder ejection fraction of 50%, normal.  Right upper quadrant ultrasound September 29, 2019 with normal gallbladder, fatty liver noted.   Patient presents today stating that she has had this pain off and on for over 2 decades.  Pain is in the right upper quadrant just underneath the rib.  Constant pain but worse at times.  There have been periods of time when she had no pain.  Currently the pain is more of a nagging/aggravating at a level of 6 out of 10 with 10 being the worst pain ever.  She denies nausea or vomiting.  No heartburn. BM copule of times per day. Sometimes stools harder.  No blood in the stool or melena.  No unintentional weight loss.  She reports having a colonoscopy and upper endoscopy at [facility] about 3 years ago in [facility].  We requested  records.  Colonoscopy [facility] 3 years ago. [place].    Current Outpatient Medications  Medication Sig Dispense Refill  . aspirin EC 81 MG tablet Take 81 mg by mouth daily.    . cholecalciferol (VITAMIN D3) 25 MCG (1000 UNIT) tablet Take 1,000 Units by mouth daily.    . rosuvastatin (CRESTOR) 10 MG tablet Take 1 tablet (10 mg total) by mouth daily. 90 tablet 2   No current facility-administered medications for this visit.    Allergies as of 10/22/2019 - Review Complete 10/22/2019  Allergen Reaction Noted  . Ranitidine Swelling 08/06/2019    Past Medical History:  Diagnosis Date  . Acid reflux   . Fatty liver 09/30/2019  . Hyperlipidemia   . Sleep apnea   . Vitamin D deficiency     Past Surgical History:  Procedure Laterality Date  . BREAST LUMPECTOMY     left breast lump  . CARPAL TUNNEL RELEASE Bilateral   . HAND SURGERY     trigger fingers  . KNEE SURGERY     right knee arthoplasty  . REPLACEMENT TOTAL KNEE Right 12/2017  . SHOULDER ARTHROSCOPY Bilateral    rotator cuff repairs    Family History  Problem Relation Age of Onset  . Heart disease Mother   . Heart attack Mother   . COPD Father   . Heart disease Father   . Ovarian cancer Sister 62  . Leukemia Sister 65  . Obesity Brother   .  Colon cancer Neg Hx     Social History   Socioeconomic History  . Marital status: Married    Spouse name: Not on file  . Number of children: Not on file  . Years of education: Not on file  . Highest education level: Not on file  Occupational History  . Not on file  Tobacco Use  . Smoking status: Never Smoker  . Smokeless tobacco: Never Used  Vaping Use  . Vaping Use: Never used  Substance and Sexual Activity  . Alcohol use: Yes    Comment: social   . Drug use: Never  . Sexual activity: Not Currently  Other Topics Concern  . Not on file  Social History Narrative  . Not on file   Social Determinants of Health   Financial Resource Strain:   .  Difficulty of Paying Living Expenses:   Food Insecurity:   . Worried About Programme researcher, broadcasting/film/video in the Last Year:   . Barista in the Last Year:   Transportation Needs:   . Freight forwarder (Medical):   Marland Kitchen Lack of Transportation (Non-Medical):   Physical Activity:   . Days of Exercise per Week:   . Minutes of Exercise per Session:   Stress:   . Feeling of Stress :   Social Connections:   . Frequency of Communication with Friends and Family:   . Frequency of Social Gatherings with Friends and Family:   . Attends Religious Services:   . Active Member of Clubs or Organizations:   . Attends Banker Meetings:   Marland Kitchen Marital Status:   Intimate Partner Violence:   . Fear of Current or Ex-Partner:   . Emotionally Abused:   Marland Kitchen Physically Abused:   . Sexually Abused:       ROS:  General: Negative for anorexia, weight loss, fever, chills, fatigue, weakness.  Has been having lots of sweats, being worked up by PCP Eyes: Negative for vision changes.  ENT: Negative for hoarseness, difficulty swallowing , nasal congestion. CV: Negative for chest pain, angina, palpitations, dyspnea on exertion, peripheral edema.  Respiratory: Negative for dyspnea at rest, dyspnea on exertion, cough, sputum, wheezing.  GI: See history of present illness. GU:  Negative for dysuria, hematuria, urinary incontinence, urinary frequency, nocturnal urination.  MS: Negative for joint pain, low back pain.  Chronic neck pain Derm: Negative for rash or itching.  Neuro: Negative for weakness, abnormal sensation, seizure, frequent headaches, memory loss, confusion.  Psych: Negative for anxiety, depression, suicidal ideation, hallucinations.  Endo: Negative for unusual weight change.  Heme: Negative for bruising or bleeding. Allergy: Negative for rash or hives.    Physical Examination:  BP 135/78   Pulse 93   Temp (!) 97.5 F (36.4 C) (Temporal)   Ht 4\' 11"  (1.499 m)   Wt 233 lb (105.7 kg)    BMI 47.06 kg/m    General: Well-nourished, well-developed in no acute distress.  Head: Normocephalic, atraumatic.   Eyes: Conjunctiva pink, no icterus. Mouth: Masked. Neck: Supple without thyromegaly, masses, or lymphadenopathy.  Lungs: Clear to auscultation bilaterally.  Heart: Regular rate and rhythm, no murmurs rubs or gallops.  Abdomen: Bowel sounds are normal, nontender, nondistended, no hepatosplenomegaly or masses, no abdominal bruits or    hernia , no rebound or guarding.  Currently without pain.  Points to the right upper quadrant just at/beneath the right costal margin in the midclavicular line Rectal: Not performed Extremities: No lower extremity edema. No clubbing or  deformities.  Neuro: Alert and oriented x 4 , grossly normal neurologically.  Skin: Warm and dry, no rash or jaundice.   Psych: Alert and cooperative, normal mood and affect.  Labs: Lab Results  Component Value Date   TSH 1.990 09/10/2019   Lab Results  Component Value Date   CREATININE 0.89 09/10/2019   BUN 14 09/10/2019   NA 140 09/10/2019   K 4.2 09/10/2019   CL 105 09/10/2019   CO2 23 09/10/2019   Lab Results  Component Value Date   ALT 25 09/10/2019   AST 26 09/10/2019   ALKPHOS 71 09/10/2019   BILITOT 0.7 09/10/2019   Lab Results  Component Value Date   WBC 5.8 09/10/2019   HGB 12.9 09/10/2019   HCT 37.9 09/10/2019   MCV 91 09/10/2019   PLT 168 09/10/2019   Lab Results  Component Value Date   VITAMINB12 442 09/10/2019     Imaging Studies: US Abdomen Limited RUQ  Result Date: 09/29/2019 CLINICAL DATA:  Postprandial right upper quadrant pain x multiple weeks. EXAM: ULTRASOUND ABDOMEN LIMITED RIGHT UPPER QUADRANT COMPARISON:  None. FINDINGS: Gallbladder: No gallstones or wall thickening visualized (2.7 mm). No sonographic Murphy sign noted by sonographer. Common bile duct: Diameter: 4.7 mm. Liver: No focal lesion identified. The there is diffusely increased echogenicity of the liver  parenchyma. Portal vein is patent on color Doppler imaging with normal direction of blood flow towards the liver. Other: None. IMPRESSION: Fatty liver. Electronically Signed   By: Aram Candela M.D.   On: 09/29/2019 23:08   Impression/plan:  59 year old female with chronic right upper quadrant abdominal pain for over 2 decades, extensive evaluation in the past, presenting for further evaluation.  She has seen several gastroenterologist throughout the years, reports multiple endoscopies and colonoscopies, CTs as outlined.  Never provided a explanation for her abdominal pain.  She reports EGD and colonoscopy 3 years ago at [facility], we requested those records.  Her pain can be positional, worse with sitting.  Sometimes related to meals but not always.  Symptoms not clearly biliary.  She reports one time she was told that she was full of stool on imaging even though she has daily bowel movements.  She wonders if this may be contributing to the problem as well.  Previous CT with interesting findings of splenomegaly and varices in the left upper quadrant, splenorenal shunt.  No recent imaging of these findings.  Plan to repeat CT abdomen pelvis with contrast.  We will update her creatinine prior to CT.  Trial of Linzess 290 mcg daily on an empty stomach to increase bowel movements to determine if helps her left upper quadrant pain.  If no significant change, she will discontinue medication.  She is aware that the goal is not daily diarrhea therefore will hold dose if significant diarrhea.  Samples provided.  Once CT has been completed and we have reviewed most recent EGD and colonoscopy reports, will make further recommendations.

## 2019-10-22 NOTE — Patient Instructions (Signed)
1. CT scan of abdomen as scheduled.  2. Please have your labs done at least two days before your CT. You can have done at any time at [facility] on [address] in [place]. 3. Try Linzess daily on empty stomach to move your bowels adequately to see if your pain is improved after good BMs. Samples provided.  4. We will obtain records of previous colonoscopy and upper endoscopy for review.

## 2019-10-23 ENCOUNTER — Telehealth: Payer: Self-pay | Admitting: *Deleted

## 2019-10-23 NOTE — Telephone Encounter (Signed)
PA for CT pending review via [employer] [facility]. Ref# [id number]

## 2019-10-24 NOTE — Telephone Encounter (Signed)
PA approved. Dates 10/23/19-01/21/2020. ZNBV#[id number]

## 2019-11-11 LAB — BASIC METABOLIC PANEL
BUN: 12 mg/dL (ref 7–25)
CO2: 29 mmol/L (ref 20–32)
Calcium: 9.5 mg/dL (ref 8.6–10.4)
Chloride: 106 mmol/L (ref 98–110)
Creat: 0.86 mg/dL (ref 0.50–1.05)
Glucose, Bld: 94 mg/dL (ref 65–139)
Potassium: 3.7 mmol/L (ref 3.5–5.3)
Sodium: 142 mmol/L (ref 135–146)

## 2019-11-14 ENCOUNTER — Other Ambulatory Visit (HOSPITAL_COMMUNITY): Payer: Self-pay | Admitting: Internal Medicine

## 2019-11-14 ENCOUNTER — Ambulatory Visit (HOSPITAL_COMMUNITY)
Admission: RE | Admit: 2019-11-14 | Discharge: 2019-11-14 | Disposition: A | Discharge status: Home / Self Care | Payer: 59 | Source: Ambulatory Visit | Attending: Gastroenterology | Admitting: Gastroenterology

## 2019-11-14 ENCOUNTER — Other Ambulatory Visit: Payer: Self-pay

## 2019-11-14 DIAGNOSIS — K760 Fatty (change of) liver, not elsewhere classified: Secondary | ICD-10-CM | POA: Diagnosis not present

## 2019-11-14 DIAGNOSIS — I868 Varicose veins of other specified sites: Secondary | ICD-10-CM

## 2019-11-14 DIAGNOSIS — K5900 Constipation, unspecified: Secondary | ICD-10-CM | POA: Diagnosis present

## 2019-11-14 DIAGNOSIS — R161 Splenomegaly, not elsewhere classified: Secondary | ICD-10-CM | POA: Diagnosis present

## 2019-11-14 DIAGNOSIS — R1011 Right upper quadrant pain: Secondary | ICD-10-CM | POA: Insufficient documentation

## 2019-11-14 MED ORDER — IOHEXOL 300 MG/ML  SOLN
100.0000 mL | Freq: Once | INTRAMUSCULAR | Status: AC | PRN
  Administered 2019-11-14: 100 mL via INTRAVENOUS

## 2019-11-15 DIAGNOSIS — R160 Hepatomegaly, not elsewhere classified: Secondary | ICD-10-CM

## 2019-11-15 DIAGNOSIS — K760 Fatty (change of) liver, not elsewhere classified: Secondary | ICD-10-CM

## 2019-11-18 ENCOUNTER — Telehealth: Payer: Self-pay | Admitting: Internal Medicine

## 2019-11-18 NOTE — Telephone Encounter (Signed)
[facility] called saying that Dr Nadene Rubins wanted to speak with Tana Coast, PA regarding patient's results. Please call [phone]

## 2019-11-18 NOTE — Telephone Encounter (Signed)
Spoke with Dr. Nadene Rubins.

## 2019-11-19 ENCOUNTER — Other Ambulatory Visit: Payer: Self-pay | Admitting: *Deleted

## 2019-11-19 DIAGNOSIS — R9389 Abnormal findings on diagnostic imaging of other specified body structures: Secondary | ICD-10-CM

## 2019-11-24 LAB — PROTIME-INR
INR: 1
Prothrombin Time: 10.7 s (ref 9.0–11.5)

## 2019-11-24 LAB — COMPREHENSIVE METABOLIC PANEL
AG Ratio: 2.2 (calc) (ref 1.0–2.5)
ALT: 22 U/L (ref 6–29)
AST: 31 U/L (ref 10–35)
Albumin: 4.3 g/dL (ref 3.6–5.1)
Alkaline phosphatase (APISO): 63 U/L (ref 37–153)
BUN: 11 mg/dL (ref 7–25)
CO2: 30 mmol/L (ref 20–32)
Calcium: 9.1 mg/dL (ref 8.6–10.4)
Chloride: 106 mmol/L (ref 98–110)
Creat: 0.8 mg/dL (ref 0.50–1.05)
Globulin: 2 g/dL (calc) (ref 1.9–3.7)
Glucose, Bld: 172 mg/dL — ABNORMAL HIGH (ref 65–99)
Potassium: 3.8 mmol/L (ref 3.5–5.3)
Sodium: 140 mmol/L (ref 135–146)
Total Bilirubin: 0.7 mg/dL (ref 0.2–1.2)
Total Protein: 6.3 g/dL (ref 6.1–8.1)

## 2019-11-24 LAB — IRON,TIBC AND FERRITIN PANEL
%SAT: 26 % (calc) (ref 16–45)
Ferritin: 224 ng/mL (ref 16–232)
Iron: 89 ug/dL (ref 45–160)
TIBC: 341 mcg/dL (calc) (ref 250–450)

## 2019-11-24 LAB — CBC WITH DIFFERENTIAL/PLATELET
Absolute Monocytes: 504 cells/uL (ref 200–950)
Basophils Absolute: 21 cells/uL (ref 0–200)
Basophils Relative: 0.4 %
Eosinophils Absolute: 69 cells/uL (ref 15–500)
Eosinophils Relative: 1.3 %
HCT: 39.3 % (ref 35.0–45.0)
Hemoglobin: 12.8 g/dL (ref 11.7–15.5)
Lymphs Abs: 1993 cells/uL (ref 850–3900)
MCH: 30.1 pg (ref 27.0–33.0)
MCHC: 32.6 g/dL (ref 32.0–36.0)
MCV: 92.5 fL (ref 80.0–100.0)
MPV: 10.2 fL (ref 7.5–12.5)
Monocytes Relative: 9.5 %
Neutro Abs: 2714 cells/uL (ref 1500–7800)
Neutrophils Relative %: 51.2 %
Platelets: 153 10*3/uL (ref 140–400)
RBC: 4.25 10*6/uL (ref 3.80–5.10)
RDW: 13.5 % (ref 11.0–15.0)
Total Lymphocyte: 37.6 %
WBC: 5.3 10*3/uL (ref 3.8–10.8)

## 2019-11-24 LAB — HEPATITIS B SURFACE ANTIGEN: Hepatitis B Surface Ag: NONREACTIVE

## 2019-11-24 LAB — HEPATITIS B SURFACE ANTIBODY,QUALITATIVE: Hep B S Ab: NONREACTIVE

## 2019-11-24 LAB — HEPATITIS A ANTIBODY, TOTAL: Hepatitis A AB,Total: NONREACTIVE

## 2019-11-24 LAB — HEPATITIS C ANTIBODY
Hepatitis C Ab: NONREACTIVE
SIGNAL TO CUT-OFF: 0.01 (ref <1.00)

## 2019-11-26 ENCOUNTER — Encounter: Payer: Self-pay | Admitting: Gastroenterology

## 2019-11-27 ENCOUNTER — Encounter: Payer: Self-pay | Admitting: *Deleted

## 2019-12-11 ENCOUNTER — Other Ambulatory Visit: Payer: Self-pay | Admitting: Family Medicine

## 2019-12-11 ENCOUNTER — Encounter: Payer: Self-pay | Admitting: Obstetrics & Gynecology

## 2019-12-11 ENCOUNTER — Ambulatory Visit (INDEPENDENT_AMBULATORY_CARE_PROVIDER_SITE_OTHER): Payer: 59 | Admitting: Obstetrics & Gynecology

## 2019-12-11 VITALS — BP 134/84 | HR 86 | Ht 59.0 in | Wt 235.5 lb

## 2019-12-11 DIAGNOSIS — N83202 Unspecified ovarian cyst, left side: Secondary | ICD-10-CM | POA: Diagnosis not present

## 2019-12-11 DIAGNOSIS — N83201 Unspecified ovarian cyst, right side: Secondary | ICD-10-CM

## 2019-12-11 DIAGNOSIS — E782 Mixed hyperlipidemia: Secondary | ICD-10-CM

## 2019-12-11 DIAGNOSIS — R682 Dry mouth, unspecified: Secondary | ICD-10-CM

## 2019-12-11 NOTE — Progress Notes (Signed)
Follow up appointment for results  Chief Complaint  Patient presents with  . talk to dr.    see 9/3 CT results    Blood pressure 134/84, pulse 86, height 4\' 11"  (1.499 m), weight 235 lb 8 oz (106.8 kg).  Addenda  ADDENDUM REPORT: 11/18/2019 12:52  ADDENDUM: There is some mild gallbladder distension which is nonspecific and seen without pericholecystic stranding. Findings of potential liver disease and ovarian findings were discussed with the provider as outlined.  These results were called by telephone at the time of interpretation on 11/18/2019 at 12:52 pm to provider LESLIE LEWIS , who verbally acknowledged these results.   Electronically Signed   By: 01/18/2020 M.D.   On: 11/18/2019 12:52   Signed by 01/18/2020, MD on 11/18/2019 12:54 PM  Narrative & Impression  CLINICAL DATA:  Splenomegaly, RIGHT upper quadrant pain with splenic varices  EXAM: CT ABDOMEN AND PELVIS WITH CONTRAST  TECHNIQUE: Multidetector CT imaging of the abdomen and pelvis was performed using the standard protocol following bolus administration of intravenous contrast.  CONTRAST:  01/18/2020 OMNIPAQUE IOHEXOL 300 MG/ML  SOLN  COMPARISON:  [facility] 14-2009  FINDINGS: Lower chest: Pleural and parenchymal scarring at the RIGHT lung base in the setting of exuberant osteophytes along the RIGHT of the spine. No effusion. No consolidation.  Hepatobiliary: Hepatic Lea with hepatic steatosis liver measures approximately 18 cm greatest craniocaudal dimension extending across the midline. No suspicious focal hepatic lesion. No pericholecystic stranding. No biliary duct dilation.  Pancreas: No peripancreatic stranding or ductal dilation.  Spleen: Spleen is enlarged at 16-17 cm greatest craniocaudal extent, quite similar to its size in 2009. Evidence of perigastric varices likely with esophageal varices as before.  Adrenals/Urinary Tract: Adrenal glands are normal.  No hydronephrosis.  The urinary bladder is normal. Symmetric renal enhancement.  Stomach/Bowel: No acute gastrointestinal process. Appendix is normal. Colon is largely stool filled without pericolonic stranding.  Vascular/Lymphatic: Vascular structures in the abdomen are patent. No aneurysmal dilation or atherosclerotic changes. No adenopathy in the upper abdomen or in the retroperitoneum.  No pelvic lymphadenopathy.  Reproductive: Fullness of both the LEFT and RIGHT adnexa, of uncertain significance in this patient who is likely postmenopausal more so on the RIGHT than the LEFT. There was fullness in the adnexa before. No stranding about the adnexa or uterus. No free fluid in the pelvis.  Other: No ascites.  No free air.  Musculoskeletal: No acute bone finding. No destructive bone process.  IMPRESSION: 1. Hepatosplenomegaly with hepatic steatosis. Findings suggest liver disease and portal hypertension and are similar to the previous imaging study of 2009. 2. Evidence of perigastric varices as before, potentially associated with esophageal varices currently. 3. Fullness of both the LEFT and RIGHT adnexa, of uncertain significance in this patient who is likely postmenopausal more so on the RIGHT than the LEFT. There was fullness in the adnexa before. Findings could reflect changes of PCOS given that fullness and cystic changes in the ovaries have been present since 2009. Follow-up pelvic ultrasound may be helpful in this postmenopausal patient. Correlate with any pelvic symptoms. 4. Aortic atherosclerosis.  Aortic Atherosclerosis (ICD10-I70.0).  Electronically Signed: By: 2010 M.D. On: 11/14/2019 17:46       MEDS ordered this encounter: No orders of the defined types were placed in this encounter.   Orders for this encounter: Orders Placed This Encounter  Procedures  . 01/14/2020 PELVIS (TRANSABDOMINAL ONLY)  . US PELVIS TRANSVAGINAL NON-OB (TV ONLY)  . CA 125  Impression: 1. Cysts of both ovaries  - CA 125 - US PELVIS (TRANSABDOMINAL ONLY); Future - US PELVIS TRANSVAGINAL NON-OB (TV ONLY); Future   Plan: CA 125 and sonograms for evaluation, unlikely any abnormalities, stable for 12 years  Follow Up: No follow-ups on file.       Face to face time:  20 minutes  Greater than 50% of the visit time was spent in counseling and coordination of care with the patient.  The summary and outline of the counseling and care coordination is summarized in the note above.   All questions were answered.  Past Medical History:  Diagnosis Date  . Acid reflux   . Fatty liver 09/30/2019  . Hyperlipidemia   . Sleep apnea   . Vitamin D deficiency     Past Surgical History:  Procedure Laterality Date  . BREAST LUMPECTOMY     left breast lump  . CARPAL TUNNEL RELEASE Bilateral   . ESOPHAGOGASTRODUODENOSCOPY  10/2015   Dr. Noe Gens in [facility]: Esophageal nodule measuring 10 mm at 20 cm in the esophagus.  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  Esophagitis noted at 4 cm above the GE junction.  Gastritis.  8 mm gastric ulcer.  Multiple gastric biopsy showed chronic inflammation but no H. pylori.  . ESOPHAGOGASTRODUODENOSCOPY  12/2015   Dr. Noe Gens at [facility]: Esophagitis at 4 cm above the GE junction, gastritis, gastric ulcer healed.  Esophageal and gastric biopsies benign  . HAND SURGERY     trigger fingers  . KNEE SURGERY     right knee arthoplasty  . REPLACEMENT TOTAL KNEE Right 12/2017  . SHOULDER ARTHROSCOPY Bilateral    rotator cuff repairs    OB History    Gravida  1   Para  1   Term  1   Preterm      AB      Living  1     SAB      TAB      Ectopic      Multiple      Live Births  1           Allergies  Allergen Reactions  . Ranitidine Swelling    Swollen lips.    Social History   Socioeconomic History  . Marital status: Married     Spouse name: Not on file  . Number of children: Not on file  . Years of education: Not on file  . Highest education level: Not on file  Occupational History  . Not on file  Tobacco Use  . Smoking status: Never Smoker  . Smokeless tobacco: Never Used  Vaping Use  . Vaping Use: Never used  Substance and Sexual Activity  . Alcohol use: Yes    Comment: social   . Drug use: Never  . Sexual activity: Not Currently    Birth control/protection: Post-menopausal  Other Topics Concern  . Not on file  Social History Narrative  . Not on file   Social Determinants of Health   Financial Resource Strain: High Risk  . Difficulty of Paying Living Expenses: Hard  Food Insecurity: No Food Insecurity  . Worried About Programme researcher, broadcasting/film/video in the Last Year: Never true  . Ran Out of Food in the Last Year: Never true  Transportation Needs: No Transportation Needs  . Lack of Transportation (Medical): No  . Lack of Transportation (Non-Medical): No  Physical Activity: Inactive  . Days of Exercise  per Week: 0 days  . Minutes of Exercise per Session: 0 min  Stress: No Stress Concern Present  . Feeling of Stress : Not at all  Social Connections: Socially Isolated  . Frequency of Communication with Friends and Family: Once a week  . Frequency of Social Gatherings with Friends and Family: Once a week  . Attends Religious Services: Never  . Active Member of Clubs or Organizations: No  . Attends Banker Meetings: Never  . Marital Status: Married    Family History  Problem Relation Age of Onset  . Heart disease Mother   . Heart attack Mother   . COPD Father   . Heart disease Father   . Ovarian cancer Sister 73  . Leukemia Sister 18  . Obesity Brother   . Colon cancer Neg Hx

## 2019-12-12 LAB — CA 125: Cancer Antigen (CA) 125: 11.5 U/mL (ref 0.0–38.1)

## 2020-01-05 ENCOUNTER — Encounter: Payer: Self-pay | Admitting: Obstetrics & Gynecology

## 2020-01-05 ENCOUNTER — Ambulatory Visit (INDEPENDENT_AMBULATORY_CARE_PROVIDER_SITE_OTHER): Payer: 59 | Admitting: Obstetrics & Gynecology

## 2020-01-05 ENCOUNTER — Ambulatory Visit (INDEPENDENT_AMBULATORY_CARE_PROVIDER_SITE_OTHER): Payer: 59

## 2020-01-05 VITALS — BP 114/77 | HR 81 | Ht 59.0 in | Wt 232.4 lb

## 2020-01-05 DIAGNOSIS — N83201 Unspecified ovarian cyst, right side: Secondary | ICD-10-CM | POA: Diagnosis not present

## 2020-01-05 DIAGNOSIS — N83202 Unspecified ovarian cyst, left side: Secondary | ICD-10-CM

## 2020-01-05 NOTE — Progress Notes (Signed)
PELVIC US TA/TV: homogeneous anteverted uterus,single mid/ left intramural fibroid 1 x .8 x 1.1 cm, EEC 3.7 mm,bilaterally enlarged ovaries with mult small follicles,limited view of left ovary,ovaries appear mobile,no pain during ultrasound  Chaperone Peggy

## 2020-01-05 NOTE — Progress Notes (Signed)
Follow up appointment for results  Chief Complaint  Patient presents with   Follow-up    u/s    Blood pressure 114/77, pulse 81, height 4\' 11"  (1.499 m), weight 232 lb 6.4 oz (105.4 kg).  PELVIS TRANSVAGINAL NON-OB (TV ONLY)  Result Date: 01/05/2020 GYNECOLOGIC SONOGRAM Brenda Lamb is a 59 y.o. G1P1001 No LMP recorded. Patient is postmenopausal. She is here for a pelvic sonogram for enlarged ovaries. Uterus                      7.6 x 3.3 x 4.5 cm, Total uterine volume 59 cc, homogeneous anteverted uterus,single mid/ left intramural fibroid 1 x .8 x 1.1 cm Endometrium          3.7 mm, symmetrical, wnl Right ovary             4.3 x 2.8 x 3.4 cm, vol 21 ml,enlarged ovary with mult.small follicles Left ovary                4.2 x 2.8 x 3.4 cm, vol 21 ml,enlarged ovary with mult.small follicles No free fluid Technician Comments: PELVIC 46 TA/TV: homogeneous anteverted uterus,single mid/ left intramural fibroid 1 x .8 x 1.1 cm, EEC 3.7 mm,bilaterally enlarged ovaries with mult small follicles,limited view of left ovary,ovaries appear mobile,no pain during ultrasound Chaperone [address] 01/05/2020 2:12 PM Clinical Impression and recommendations: I have reviewed the sonogram results above, combined with the patient's current clinical course, below are my impressions and any appropriate recommendations for management based on the sonographic findings. Uterus is normal size shape and contour Endometrium is normal Both ovaries are normal size shape and contour, no abnormalities No follow up is needed 01/07/2020 01/05/2020 2:59 PM  01/07/2020 PELVIS (TRANSABDOMINAL ONLY)  Result Date: 01/05/2020 GYNECOLOGIC SONOGRAM Brenda Lamb is a 59 y.o. G1P1001 No LMP recorded. Patient is postmenopausal. She is here for a pelvic sonogram for enlarged ovaries. Uterus                      7.6 x 3.3 x 4.5 cm, Total uterine volume 59 cc, homogeneous anteverted uterus,single mid/ left intramural fibroid 1 x .8 x  1.1 cm Endometrium          3.7 mm, symmetrical, wnl Right ovary             4.3 x 2.8 x 3.4 cm, vol 21 ml,enlarged ovary with mult.small follicles Left ovary                4.2 x 2.8 x 3.4 cm, vol 21 ml,enlarged ovary with mult.small follicles No free fluid Technician Comments: PELVIC 46 TA/TV: homogeneous anteverted uterus,single mid/ left intramural fibroid 1 x .8 x 1.1 cm, EEC 3.7 mm,bilaterally enlarged ovaries with mult small follicles,limited view of left ovary,ovaries appear mobile,no pain during ultrasound Chaperone [address] 01/05/2020 2:12 PM Clinical Impression and recommendations: I have reviewed the sonogram results above, combined with the patient's current clinical course, below are my impressions and any appropriate recommendations for management based on the sonographic findings. Uterus is normal size shape and contour Endometrium is normal Both ovaries are normal size shape and contour, no abnormalities No follow up is needed 01/07/2020 01/05/2020 2:59 PM     MEDS ordered this encounter: No orders of the defined types were placed in this encounter.   Orders for this encounter: No orders of the defined  types were placed in this encounter.   Impression:   ICD-10-CM   1. Cysts of both ovaries, follicular  N83.201    N83.202    postmenopausal 15 years ago     Plan:   Follow Up: Return if symptoms worsen or fail to improve.       Face to face time:  10 minutes  Greater than 50% of the visit time was spent in counseling and coordination of care with the patient.  The summary and outline of the counseling and care coordination is summarized in the note above.   All questions were answered.  Past Medical History:  Diagnosis Date   Acid reflux    Fatty liver 09/30/2019   Hyperlipidemia    Sleep apnea    Vitamin D deficiency     Past Surgical History:  Procedure Laterality Date   BREAST LUMPECTOMY     left breast lump   CARPAL TUNNEL  RELEASE Bilateral    ESOPHAGOGASTRODUODENOSCOPY  10/2015   Dr. Noe Gens in [facility]: Esophageal nodule measuring 10 mm at 20 cm in the esophagus.  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  Esophagitis noted at 4 cm above the GE junction.  Gastritis.  8 mm gastric ulcer.  Multiple gastric biopsy showed chronic inflammation but no H. pylori.   ESOPHAGOGASTRODUODENOSCOPY  12/2015   Dr. Noe Gens at [facility]: Esophagitis at 4 cm above the GE junction, gastritis, gastric ulcer healed.  Esophageal and gastric biopsies benign   HAND SURGERY     trigger fingers   KNEE SURGERY     right knee arthoplasty   REPLACEMENT TOTAL KNEE Right 12/2017   SHOULDER ARTHROSCOPY Bilateral    rotator cuff repairs    OB History    Gravida  1   Para  1   Term  1   Preterm      AB      Living  1     SAB      TAB      Ectopic      Multiple      Live Births  1           Allergies  Allergen Reactions   Ranitidine Swelling    Swollen lips.    Social History   Socioeconomic History   Marital status: Married    Spouse name: Not on file   Number of children: Not on file   Years of education: Not on file   Highest education level: Not on file  Occupational History   Not on file  Tobacco Use   Smoking status: Never Smoker   Smokeless tobacco: Never Used  Vaping Use   Vaping Use: Never used  Substance and Sexual Activity   Alcohol use: Yes    Comment: social    Drug use: Never   Sexual activity: Not Currently    Birth control/protection: Post-menopausal  Other Topics Concern   Not on file  Social History Narrative   Not on file   Social Determinants of Health   Financial Resource Strain: High Risk   Difficulty of Paying Living Expenses: Hard  Food Insecurity: No Food Insecurity   Worried About Running Out of Food in the Last Year: Never true   Ran Out of Food in the Last Year: Never true   Transportation Needs: No Transportation Needs   Lack of Transportation (Medical): No   Lack of Transportation (Non-Medical): No  Physical Activity: Inactive  Days of Exercise per Week: 0 days   Minutes of Exercise per Session: 0 min  Stress: No Stress Concern Present   Feeling of Stress : Not at all  Social Connections: Socially Isolated   Frequency of Communication with Friends and Family: Once a week   Frequency of Social Gatherings with Friends and Family: Once a week   Attends Religious Services: Never   Database administrator or Organizations: No   Attends Engineer, structural: Never   Marital Status: Married    Family History  Problem Relation Age of Onset   Heart disease Mother    Heart attack Mother    COPD Father    Heart disease Father    Ovarian cancer Sister 60   Leukemia Sister 91   Obesity Brother    Colon cancer Neg Hx

## 2020-01-26 ENCOUNTER — Telehealth: Payer: Self-pay | Admitting: Internal Medicine

## 2020-01-26 NOTE — Telephone Encounter (Signed)
Report will be given to pts provider when received. Clyda Hurdle.

## 2020-01-26 NOTE — Telephone Encounter (Signed)
paitent said to be looking for her previous endo, her past physician will be faxing it

## 2020-01-29 NOTE — Patient Instructions (Signed)
20    Your procedure is scheduled on: 02/02/2020  Report to [facility] at   11:45  AM.  Call this number if you have problems the morning of surgery: [phone]   Remember:   Follow instructions on letter from office regarding when to stop eating and drinking        No Smoking the day of procedure      Take these medicines the morning of surgery with A SIP OF WATER: none   Do not wear jewelry, make-up or nail polish.  Do not wear lotions, powders, or perfumes. You may wear deodorant.                Do not bring valuables to the hospital.  Contacts, dentures or bridgework may not be worn into surgery.  Leave suitcase in the car. After surgery it may be brought to your room.  For patients admitted to the hospital, checkout time is 11:00 AM the day of discharge.   Patients discharged the day of surgery will not be allowed to drive home. Upper Endoscopy, Adult Upper endoscopy is a procedure to look inside the upper GI (gastrointestinal) tract. The upper GI tract is made up of:  The part of the body that moves food from your mouth to your stomach (esophagus).  The stomach.  The first part of your small intestine (duodenum). This procedure is also called esophagogastroduodenoscopy (EGD) or gastroscopy. In this procedure, your health care provider passes a thin, flexible tube (endoscope) through your mouth and down your esophagus into your stomach. A small camera is attached to the end of the tube. Images from the camera appear on a monitor in the exam room. During this procedure, your health care provider may also remove a small piece of tissue to be sent to a lab and examined under a microscope (biopsy). Your health care provider may do an upper endoscopy to diagnose cancers of the upper GI tract. You may also have this procedure to find the cause of other conditions, such as:  Stomach pain.  Heartburn.  Pain or problems when swallowing.  Nausea and vomiting.  Stomach  bleeding.  Stomach ulcers. Tell a health care provider about:  Any allergies you have.  All medicines you are taking, including vitamins, herbs, eye drops, creams, and over-the-counter medicines.  Any problems you or family members have had with anesthetic medicines.  Any blood disorders you have.  Any surgeries you have had.  Any medical conditions you have.  Whether you are pregnant or may be pregnant. What are the risks? Generally, this is a safe procedure. However, problems may occur, including:  Infection.  Bleeding.  Allergic reactions to medicines.  A tear or hole (perforation) in the esophagus, stomach, or duodenum. What happens before the procedure? Staying hydrated Follow instructions from your health care provider about hydration, which may include:  Up to 2 hours before the procedure - you may continue to drink clear liquids, such as water, clear fruit juice, black coffee, and plain tea.  Eating and drinking restrictions Follow instructions from your health care provider about eating and drinking, which may include:  8 hours before the procedure - stop eating heavy meals or foods, such as meat, fried foods, or fatty foods.  6 hours before the procedure - stop eating light meals or foods, such as toast or cereal.  6 hours before the procedure - stop drinking milk or drinks that contain milk.  2 hours before the procedure - stop  drinking clear liquids. Medicines Ask your health care provider about:  Changing or stopping your regular medicines. This is especially important if you are taking diabetes medicines or blood thinners.  Taking medicines such as aspirin and ibuprofen. These medicines can thin your blood. Do not take these medicines unless your health care provider tells you to take them.  Taking over-the-counter medicines, vitamins, herbs, and supplements. General instructions  Plan to have someone take you home from the hospital or clinic.  If  you will be going home right after the procedure, plan to have someone with you for 24 hours.  Ask your health care provider what steps will be taken to help prevent infection. What happens during the procedure?  1. An IV will be inserted into one of your veins. 2. You may be given one or more of the following: ? A medicine to help you relax (sedative). ? A medicine to numb the throat (local anesthetic). 3. You will lie on your left side on an exam table. 4. Your health care provider will pass the endoscope through your mouth and down your esophagus. 5. Your health care provider will use the scope to check the inside of your esophagus, stomach, and duodenum. Biopsies may be taken. 6. The endoscope will be removed. The procedure may vary among health care providers and hospitals. What happens after the procedure?  Your blood pressure, heart rate, breathing rate, and blood oxygen level will be monitored until you leave the hospital or clinic.  Do not drive for 24 hours if you were given a sedative during your procedure.  When your throat is no longer numb, you may be given some fluids to drink.  It is up to you to get the results of your procedure. Ask your health care provider, or the department that is doing the procedure, when your results will be ready. Summary  Upper endoscopy is a procedure to look inside the upper GI tract.  During the procedure, an IV will be inserted into one of your veins. You may be given a medicine to help you relax.  A medicine will be used to numb your throat.  The endoscope will be passed through your mouth and down your esophagus. This information is not intended to replace advice given to you by your health care provider. Make sure you discuss any questions you have with your health care provider. Document Revised: 08/22/2017 Document Reviewed: 07/30/2017 Elsevier Patient Education  [place] After  Please read the instructions outlined below and refer to this sheet in the next few weeks. These discharge instructions provide you with general information on caring for yourself after you leave the hospital. Your doctor may also give you  specific instructions. While your treatment has been planned according to the most current medical practices available, unavoidable complications occasionally occur. If you have any problems or questions after discharge, please call your doctor. HOME CARE INSTRUCTIONS Activity  You may resume your regular activity but move at a slower pace for the next 24 hours.   Take frequent rest periods for the next 24 hours.   Walking will help expel (get rid of) the air and reduce the bloated feeling in your abdomen.   No driving for 24 hours (because of the anesthesia (medicine) used during the test).   You may shower.   Do not sign any important legal documents or operate any machinery for 24 hours (because of the anesthesia used during the test).  Nutrition  Drink plenty of fluids.   You may resume your normal diet.   Begin with a light meal and progress to your normal diet.   Avoid alcoholic beverages for 24 hours or as instructed by your caregiver.  Medications You may resume your normal medications unless your caregiver tells you otherwise. What you can expect today  You may experience abdominal discomfort such as a feeling of fullness or "gas" pains.   You may experience a sore throat for 2 to 3 days. This is normal. Gargling with salt water may help this.  Follow-up Your doctor will discuss the results of your test with you. SEEK IMMEDIATE MEDICAL CARE IF:  You have excessive nausea (feeling sick to your stomach) and/or vomiting.   You have severe abdominal pain and distention (swelling).   You have trouble swallowing.   You have a  temperature over 100 F (37.8 C).   You have rectal bleeding or vomiting of blood.  Document Released: 10/12/2003 Document Revised: 02/16/2011 Document Reviewed: 04/24/2007

## 2020-01-30 ENCOUNTER — Encounter (HOSPITAL_COMMUNITY): Payer: Self-pay

## 2020-01-30 ENCOUNTER — Other Ambulatory Visit (HOSPITAL_COMMUNITY)
Admission: RE | Admit: 2020-01-30 | Discharge: 2020-01-30 | Disposition: A | Discharge status: Home / Self Care | Payer: 59 | Source: Ambulatory Visit | Attending: Internal Medicine | Admitting: Internal Medicine

## 2020-01-30 ENCOUNTER — Other Ambulatory Visit: Payer: Self-pay

## 2020-01-30 ENCOUNTER — Encounter (HOSPITAL_COMMUNITY)
Admission: RE | Admit: 2020-01-30 | Discharge: 2020-01-30 | Disposition: A | Discharge status: Home / Self Care | Payer: 59 | Source: Ambulatory Visit | Attending: Internal Medicine | Admitting: Internal Medicine

## 2020-01-30 DIAGNOSIS — E669 Obesity, unspecified: Secondary | ICD-10-CM | POA: Insufficient documentation

## 2020-01-30 DIAGNOSIS — G4730 Sleep apnea, unspecified: Secondary | ICD-10-CM | POA: Diagnosis not present

## 2020-01-30 DIAGNOSIS — Z20822 Contact with and (suspected) exposure to covid-19: Secondary | ICD-10-CM | POA: Diagnosis not present

## 2020-01-30 DIAGNOSIS — Z01818 Encounter for other preprocedural examination: Secondary | ICD-10-CM | POA: Insufficient documentation

## 2020-01-30 LAB — SARS CORONAVIRUS 2 (TAT 6-24 HRS): SARS Coronavirus 2: NEGATIVE

## 2020-02-02 ENCOUNTER — Ambulatory Visit (HOSPITAL_COMMUNITY)
Admission: RE | Admit: 2020-02-02 | Discharge: 2020-02-02 | Disposition: A | Discharge status: Home / Self Care | Payer: 59 | Attending: Internal Medicine | Admitting: Internal Medicine

## 2020-02-02 ENCOUNTER — Ambulatory Visit (HOSPITAL_COMMUNITY): Payer: 59 | Admitting: Anesthesiology

## 2020-02-02 ENCOUNTER — Encounter (HOSPITAL_COMMUNITY)
Admission: RE | Disposition: A | Discharge status: Home / Self Care | Payer: Self-pay | Source: Home / Self Care | Attending: Internal Medicine

## 2020-02-02 DIAGNOSIS — Z79899 Other long term (current) drug therapy: Secondary | ICD-10-CM | POA: Insufficient documentation

## 2020-02-02 DIAGNOSIS — Z96651 Presence of right artificial knee joint: Secondary | ICD-10-CM | POA: Diagnosis not present

## 2020-02-02 DIAGNOSIS — K7460 Unspecified cirrhosis of liver: Secondary | ICD-10-CM | POA: Diagnosis not present

## 2020-02-02 DIAGNOSIS — Z7982 Long term (current) use of aspirin: Secondary | ICD-10-CM | POA: Insufficient documentation

## 2020-02-02 DIAGNOSIS — R1011 Right upper quadrant pain: Secondary | ICD-10-CM | POA: Diagnosis not present

## 2020-02-02 DIAGNOSIS — K3189 Other diseases of stomach and duodenum: Secondary | ICD-10-CM

## 2020-02-02 DIAGNOSIS — Z888 Allergy status to other drugs, medicaments and biological substances status: Secondary | ICD-10-CM | POA: Insufficient documentation

## 2020-02-02 HISTORY — PX: BIOPSY: SHX5522

## 2020-02-02 HISTORY — PX: ESOPHAGOGASTRODUODENOSCOPY (EGD) WITH PROPOFOL: SHX5813

## 2020-02-02 HISTORY — PX: ESOPHAGEAL BANDING: SHX5518

## 2020-02-02 LAB — GLUCOSE, CAPILLARY: Glucose-Capillary: 130 mg/dL — ABNORMAL HIGH (ref 70–99)

## 2020-02-02 SURGERY — ESOPHAGOGASTRODUODENOSCOPY (EGD) WITH PROPOFOL
Anesthesia: General

## 2020-02-02 MED ORDER — LACTATED RINGERS IV SOLN: Freq: Once | INTRAVENOUS | Status: AC

## 2020-02-02 MED ORDER — LACTATED RINGERS IV SOLN: INTRAVENOUS | Status: DC | PRN

## 2020-02-02 MED ORDER — PROPOFOL 500 MG/50ML IV EMUL
INTRAVENOUS | Status: DC | PRN
  Administered 2020-02-02: 125 ug/kg/min via INTRAVENOUS

## 2020-02-02 MED ORDER — PROPOFOL 10 MG/ML IV BOLUS
INTRAVENOUS | Status: DC | PRN
  Administered 2020-02-02: 20 mg via INTRAVENOUS
  Administered 2020-02-02: 60 mg via INTRAVENOUS

## 2020-02-02 MED ORDER — STERILE WATER FOR IRRIGATION IR SOLN
Status: DC | PRN
  Administered 2020-02-02: 1.5 mL

## 2020-02-02 MED ORDER — GLYCOPYRROLATE 0.2 MG/ML IJ SOLN
0.2000 mg | Freq: Once | INTRAMUSCULAR | Status: AC
  Administered 2020-02-02: 0.2 mg via INTRAVENOUS

## 2020-02-02 MED ORDER — LIDOCAINE HCL (CARDIAC) PF 100 MG/5ML IV SOSY
PREFILLED_SYRINGE | INTRAVENOUS | Status: DC | PRN
  Administered 2020-02-02: 50 mg via INTRAVENOUS

## 2020-02-02 MED ORDER — LIDOCAINE VISCOUS HCL 2 % MT SOLN
15.0000 mL | Freq: Once | OROMUCOSAL | Status: AC
  Administered 2020-02-02: 15 mL via OROMUCOSAL

## 2020-02-02 NOTE — Op Note (Signed)
[facility] Patient Name: Brenda Lamb Procedure Date: 02/02/2020 1:28 PM MRN: [id number] Date of Birth: 01/03/1961 Attending MD: Gennette Pac , MD CSN: [id number] Age: 59 Admit Type: Outpatient Procedure:                Upper GI endoscopy Indications:              Screening procedure, Abdominal pain in the right                            upper quadrant, Cirrhosis with suspected esophageal                            varices Providers:                Gennette Pac, MD, Angelica Ran, Burke Keels, Technician Referring MD:              Medicines:                Propofol per Anesthesia Complications:            No immediate complications. Estimated Blood Loss:     Estimated blood loss was minimal. Procedure:                Pre-Anesthesia Assessment:                           - Prior to the procedure, a History and Physical                            was performed, and patient medications and                            allergies were reviewed. The patient's tolerance of                            previous anesthesia was also reviewed. The risks                            and benefits of the procedure and the sedation                            options and risks were discussed with the patient.                            All questions were answered, and informed consent                            was obtained. Prior Anticoagulants: The patient has                            taken no previous anticoagulant or antiplatelet                            agents. ASA  Grade Assessment: III - A patient with                            severe systemic disease. After reviewing the risks                            and benefits, the patient was deemed in                            satisfactory condition to undergo the procedure.                           After obtaining informed consent, the endoscope was                            passed under direct  vision. Throughout the                            procedure, the patient's blood pressure, pulse, and                            oxygen saturations were monitored continuously. The                            GIF-[id number] ([id number]) was introduced through the                            mouth, and advanced to the second part of duodenum.                            The upper GI endoscopy was accomplished without                            difficulty. The patient tolerated the procedure                            well. Scope In: 1:54:01 PM Scope Out: 1:58:16 PM Total Procedure Duration: 0 hours 4 minutes 15 seconds  Findings:      The examined esophagus was normal.      Multiple erosions with no stigmata of recent bleeding were found in the       gastric antrum. No portal gastropathy or gastric varices or other       abnormality seen. This was biopsied with a cold forceps for histology.       Estimated blood loss was minimal.      The duodenal bulb and second portion of the duodenum were normal. Impression:               - Normal esophagus.                           - Erosive gastropathy with no stigmata of recent                            bleeding. Biopsied.                           -  Normal duodenal bulb and second portion of the                            duodenum. No endoscopic findings to go with chronic                            liver disease. Symptomatic gallbladder disease has                            not been ruled out. Moderate Sedation:      Moderate (conscious) sedation was personally administered by an       anesthesia professional. The following parameters were monitored: oxygen       saturation, heart rate, blood pressure, respiratory rate, EKG, adequacy       of pulmonary ventilation, and response to care. Recommendation:           - Patient has a contact number available for                            emergencies. The signs and symptoms of potential                             delayed complications were discussed with the                            patient. Return to normal activities tomorrow.                            Written discharge instructions were provided to the                            patient.                           - Resume previous diet.                           - Continue present medications.                           - Await pathology results.                           - Return to my office in 2 weeks further                            evaluation, cirrhosis care as appropriate. Procedure Code(s):        --- Professional ---                           [phone], Esophagogastroduodenoscopy, flexible,                            transoral; with biopsy, single or multiple Diagnosis Code(s):        --- Professional ---  K31.89, Other diseases of stomach and duodenum                           Z13.810, Encounter for screening for upper                            gastrointestinal disorder                           R10.11, Right upper quadrant pain                           K74.60, Unspecified cirrhosis of liver CPT copyright 2019 American Medical Association. All rights reserved. The codes documented in this report are preliminary and upon coder review may  be revised to meet current compliance requirements. Gerrit Friends. Anastasija Anfinson, MD Gennette Pac, MD 02/02/2020 2:08:25 PM This report has been signed electronically. Number of Addenda: 0

## 2020-02-02 NOTE — H&P (Signed)
@LOGO @   Primary Care Physician:  , FNP Primary Gastroenterologist:  Dr. Gwenlyn Fudge  Pre-Procedure History & Physical: HPI:  Brenda Lamb is a 59 y.o. female here for evaluation of right upper quadrant abdominal pain and CT picture concerning for liver disease with portal hypertension and varices. EGD today to further evaluate.  Patient denies dysphagia.  Past Medical History:  Diagnosis Date  . Acid reflux   . Fatty liver 09/30/2019  . Hyperlipidemia   . Sleep apnea   . Vitamin D deficiency     Past Surgical History:  Procedure Laterality Date  . BREAST LUMPECTOMY     left breast lump  . CARPAL TUNNEL RELEASE Bilateral   . ELBOW SURGERY     nerve release right elbow  . ESOPHAGOGASTRODUODENOSCOPY  10/2015   Dr. 11/2015 in [facility]: Esophageal nodule measuring 10 mm at 20 cm in the esophagus.  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  Esophagitis noted at 4 cm above the GE junction.  Gastritis.  8 mm gastric ulcer.  Multiple gastric biopsy showed chronic inflammation but no H. pylori.  . ESOPHAGOGASTRODUODENOSCOPY  12/2015   Dr. 01/2016 at [facility]: Esophagitis at 4 cm above the GE junction, gastritis, gastric ulcer healed.  Esophageal and gastric biopsies benign  . GANGLION CYST EXCISION    . HAND SURGERY     trigger fingers  . KNEE SURGERY     right knee arthoplasty  . REPLACEMENT TOTAL KNEE Right 12/2017  . SHOULDER ARTHROSCOPY Bilateral    rotator cuff repairs    Prior to Admission medications   Medication Sig Start Date End Date Taking? Authorizing Provider  aspirin EC 81 MG tablet Take 81 mg by mouth daily.   Yes [provider]  rosuvastatin (CRESTOR) 10 MG tablet TAKE 1 TABLET BY MOUTH EVERY DAY Patient taking differently: Take 10 mg by mouth daily.  12/12/19  Yes 02/11/20 F, FNP    Allergies as of 11/27/2019 - Review Complete 10/22/2019  Allergen Reaction Noted  . Ranitidine  Swelling 08/06/2019    Family History  Problem Relation Age of Onset  . Heart disease Mother   . Heart attack Mother   . COPD Father   . Heart disease Father   . Ovarian cancer Sister 88  . Leukemia Sister 57  . Obesity Brother   . Colon cancer Neg Hx     Social History   Socioeconomic History  . Marital status: Married    Spouse name: Not on file  . Number of children: Not on file  . Years of education: Not on file  . Highest education level: Not on file  Occupational History  . Not on file  Tobacco Use  . Smoking status: Never Smoker  . Smokeless tobacco: Never Used  Vaping Use  . Vaping Use: Never used  Substance and Sexual Activity  . Alcohol use: Yes    Comment: social   . Drug use: Never  . Sexual activity: Not Currently    Birth control/protection: Post-menopausal  Other Topics Concern  . Not on file  Social History Narrative  . Not on file   Social Determinants of Health   Financial Resource Strain: High Risk  . Difficulty of Paying Living Expenses: Hard  Food Insecurity: No Food Insecurity  . Worried About 46 in the Last Year: Never true  . Ran Out of Food in the Last Year: Never true  Transportation Needs: No Transportation Needs  . Lack of Transportation (Medical): No  . Lack of Transportation (Non-Medical): No  Physical Activity: Inactive  . Days of Exercise per Week: 0 days  . Minutes of Exercise per Session: 0 min  Stress: No Stress Concern Present  . Feeling of Stress : Not at all  Social Connections: Socially Isolated  . Frequency of Communication with Friends and Family: Once a week  . Frequency of Social Gatherings with Friends and Family: Once a week  . Attends Religious Services: Never  . Active Member of Clubs or Organizations: No  . Attends Banker Meetings: Never  . Marital Status: Married  Catering manager Violence: Not At Risk  . Fear of Current or Ex-Partner: No  . Emotionally Abused: No  .  Physically Abused: No  . Sexually Abused: No    Review of Systems: See HPI, otherwise negative ROS  Physical Exam: BP (!) 141/85 (BP Location: Right Arm)   Pulse 92   Temp 98.2 F (36.8 C)   Resp 13   SpO2 95%  General:   Alert,  Well-developed, well-nourished, pleasant and cooperative in NAD Neck:  Supple; no masses or thyromegaly. No significant cervical adenopathy. Lungs:  Clear throughout to auscultation.   No wheezes, crackles, or rhonchi. No acute distress. Heart:  Regular rate and rhythm; no murmurs, clicks, rubs,  or gallops. Abdomen: Non-distended, normal bowel sounds.  Soft and nontender without appreciable mass or hepatosplenomegaly.  Pulses:  Normal pulses noted. Extremities:  Without clubbing or edema.  Impression/Plan: 59 year old lady with probable NAFLD with associated cirrhosis and portal hypertension.  Chronic right upper quadrant abdominal pain.  EGD now to further evaluate her upper GI tract and assess for esophageal varices. The risks, benefits, limitations, alternatives and imponderables have been reviewed with the patient. Potential for esophageal dilation, biopsy, etc. have also been reviewed.  Questions have been answered. All parties agreeable.     Notice: This dictation was prepared with Dragon dictation along with smaller phrase technology. Any transcriptional errors that result from this process are unintentional and may not be corrected upon review.

## 2020-02-02 NOTE — Anesthesia Postprocedure Evaluation (Signed)
Anesthesia Post Note  Patient: Brenda Lamb  Procedure(s) Performed: ESOPHAGOGASTRODUODENOSCOPY (EGD) WITH PROPOFOL (N/A ) ESOPHAGEAL BANDING (N/A ) BIOPSY  Patient location during evaluation: PACU Anesthesia Type: General Level of consciousness: awake, oriented and awake and alert Pain management: pain level controlled Vital Signs Assessment: post-procedure vital signs reviewed and stable Respiratory status: spontaneous breathing, respiratory function stable and nonlabored ventilation Cardiovascular status: blood pressure returned to baseline and stable Postop Assessment: no apparent nausea or vomiting Anesthetic complications: no   No complications documented.   Last Vitals:  Vitals:   02/02/20 1230  BP: (!) 141/85  Pulse: 92  Resp: 13  Temp: 36.8 C  SpO2: 95%    Last Pain:  Vitals:   02/02/20 1230  PainSc: 0-No pain                 Lorin Glass

## 2020-02-02 NOTE — Anesthesia Preprocedure Evaluation (Signed)
Anesthesia Evaluation  Patient identified by MRN, date of birth, ID band Patient awake and Patient confused    Reviewed: Allergy & Precautions, NPO status , Patient's Chart, lab work & pertinent test results  History of Anesthesia Complications Negative for: history of anesthetic complications  Airway Mallampati: II  TM Distance: >3 FB Neck ROM: Full    Dental  (+) Chipped, Dental Advisory Given,    Pulmonary sleep apnea and Continuous Positive Airway Pressure Ventilation ,    Pulmonary exam normal breath sounds clear to auscultation       Cardiovascular Exercise Tolerance: Good Normal cardiovascular exam Rhythm:Regular Rate:Normal     Neuro/Psych negative neurological ROS  negative psych ROS   GI/Hepatic GERD  Controlled,Splenomegaly    Endo/Other  Morbid obesity  Renal/GU negative Renal ROS     Musculoskeletal  (+) Arthritis , Osteoarthritis,  Cervical spondylosis with myelopathy    Abdominal   Peds  Hematology   Anesthesia Other Findings   Reproductive/Obstetrics                             Anesthesia Physical Anesthesia Plan  ASA: III  Anesthesia Plan: General   Post-op Pain Management:    Induction: Intravenous  PONV Risk Score and Plan: TIVA  Airway Management Planned: Nasal Cannula and Natural Airway  Additional Equipment:   Intra-op Plan:   Post-operative Plan:   Informed Consent: I have reviewed the patients History and Physical, chart, labs and discussed the procedure including the risks, benefits and alternatives for the proposed anesthesia with the patient or authorized representative who has indicated his/her understanding and acceptance.     Dental advisory given  Plan Discussed with: CRNA and Surgeon  Anesthesia Plan Comments:         Anesthesia Quick Evaluation

## 2020-02-02 NOTE — Transfer of Care (Signed)
Immediate Anesthesia Transfer of Care Note  Patient: Brenda Lamb  Procedure(s) Performed: ESOPHAGOGASTRODUODENOSCOPY (EGD) WITH PROPOFOL (N/A ) ESOPHAGEAL BANDING (N/A ) BIOPSY  Patient Location: PACU  Anesthesia Type:General  Level of Consciousness: awake, alert  and oriented  Airway & Oxygen Therapy: Patient Spontanous Breathing  Post-op Assessment: Report given to RN and Post -op Vital signs reviewed and stable  Post vital signs: Reviewed and stable  Last Vitals:  Vitals Value Taken Time  BP    Temp    Pulse 101 02/02/20 1403  Resp 15 02/02/20 1403  SpO2 92 % 02/02/20 1403  Vitals shown include unvalidated device data.  Last Pain:  Vitals:   02/02/20 1230  PainSc: 0-No pain         Complications: No complications documented.

## 2020-02-02 NOTE — Discharge Instructions (Signed)
EGD Discharge instructions Please read the instructions outlined below and refer to this sheet in the next few weeks. These discharge instructions provide you with general information on caring for yourself after you leave the hospital. Your doctor may also give you specific instructions. While your treatment has been planned according to the most current medical practices available, unavoidable complications occasionally occur. If you have any problems or questions after discharge, please call your doctor. ACTIVITY  You may resume your regular activity but move at a slower pace for the next 24 hours.   Take frequent rest periods for the next 24 hours.   Walking will help expel (get rid of) the air and reduce the bloated feeling in your abdomen.   No driving for 24 hours (because of the anesthesia (medicine) used during the test).   You may shower.   Do not sign any important legal documents or operate any machinery for 24 hours (because of the anesthesia used during the test).  NUTRITION  Drink plenty of fluids.   You may resume your normal diet.   Begin with a light meal and progress to your normal diet.   Avoid alcoholic beverages for 24 hours or as instructed by your caregiver.  MEDICATIONS  You may resume your normal medications unless your caregiver tells you otherwise.  WHAT YOU CAN EXPECT TODAY  You may experience abdominal discomfort such as a feeling of fullness or "gas" pains.  FOLLOW-UP  Your doctor will discuss the results of your test with you.  SEEK IMMEDIATE MEDICAL ATTENTION IF ANY OF THE FOLLOWING OCCUR:  Excessive nausea (feeling sick to your stomach) and/or vomiting.   Severe abdominal pain and distention (swelling).   Trouble swallowing.   Temperature over 101 F (37.8 C).   Rectal bleeding or vomiting of blood.    Your stomach was a little inflamed.  Biopsies were taken.  However there was no evidence of esophageal varicose veins or other  abnormality in your stomach which is good.  Further recommendations to follow pending review of pathology report  At patient request, I called Xitlaly Ault at [phone] -reviewed findings and recommendations    Monitored Anesthesia Care, Care After These instructions provide you with information about caring for yourself after your procedure. Your health care provider may also give you more specific instructions. Your treatment has been planned according to current medical practices, but problems sometimes occur. Call your health care provider if you have any problems or questions after your procedure. What can I expect after the procedure? After your procedure, you may:  Feel sleepy for several hours.  Feel clumsy and have poor balance for several hours.  Feel forgetful about what happened after the procedure.  Have poor judgment for several hours.  Feel nauseous or vomit.  Have a sore throat if you had a breathing tube during the procedure. Follow these instructions at home: For at least 24 hours after the procedure:      Have a responsible adult stay with you. It is important to have someone help care for you until you are awake and alert.  Rest as needed.  Do not: ? Participate in activities in which you could fall or become injured. ? Drive. ? Use heavy machinery. ? Drink alcohol. ? Take sleeping pills or medicines that cause drowsiness. ? Make important decisions or sign legal documents. ? Take care of children on your own. Eating and drinking  Follow the diet that is recommended by your health care provider.  If you vomit, drink water, juice, or soup when you can drink without vomiting.  Make sure you have little or no nausea before eating solid foods. General instructions  Take over-the-counter and prescription medicines only as told by your health care provider.  If you have sleep apnea, surgery and certain medicines can increase your risk for breathing  problems. Follow instructions from your health care provider about wearing your sleep device: ? Anytime you are sleeping, including during daytime naps. ? While taking prescription pain medicines, sleeping medicines, or medicines that make you drowsy.  If you smoke, do not smoke without supervision.  Keep all follow-up visits as told by your health care provider. This is important. Contact a health care provider if:  You keep feeling nauseous or you keep vomiting.  You feel light-headed.  You develop a rash.  You have a fever. Get help right away if:  You have trouble breathing. Summary  For several hours after your procedure, you may feel sleepy and have poor judgment.  Have a responsible adult stay with you for at least 24 hours or until you are awake and alert. This information is not intended to replace advice given to you by your health care provider. Make sure you discuss any questions you have with your health care provider. Document Revised: 05/28/2017 Document Reviewed: 06/20/2015 Elsevier Patient Education  [place].

## 2020-02-03 MED ORDER — LIDOCAINE VISCOUS HCL 2 % MT SOLN
OROMUCOSAL | Status: AC
  Filled 2020-02-03: qty 15

## 2020-02-03 MED ORDER — GLYCOPYRROLATE 0.2 MG/ML IJ SOLN
INTRAMUSCULAR | Status: AC
  Filled 2020-02-03: qty 1

## 2020-02-04 ENCOUNTER — Other Ambulatory Visit: Payer: Self-pay

## 2020-02-04 LAB — SURGICAL PATHOLOGY

## 2020-02-08 ENCOUNTER — Encounter: Payer: Self-pay | Admitting: Internal Medicine

## 2020-02-09 ENCOUNTER — Encounter (HOSPITAL_COMMUNITY): Payer: Self-pay | Admitting: Internal Medicine

## 2020-03-09 ENCOUNTER — Other Ambulatory Visit: Payer: Self-pay

## 2020-03-09 ENCOUNTER — Encounter: Payer: Self-pay | Admitting: Internal Medicine

## 2020-03-09 ENCOUNTER — Ambulatory Visit (INDEPENDENT_AMBULATORY_CARE_PROVIDER_SITE_OTHER): Payer: 59 | Admitting: Internal Medicine

## 2020-03-09 VITALS — BP 127/82 | HR 96 | Temp 97.7°F | Ht 59.0 in | Wt 231.8 lb

## 2020-03-09 DIAGNOSIS — R9389 Abnormal findings on diagnostic imaging of other specified body structures: Secondary | ICD-10-CM | POA: Diagnosis not present

## 2020-03-09 DIAGNOSIS — R1011 Right upper quadrant pain: Secondary | ICD-10-CM | POA: Diagnosis not present

## 2020-03-09 DIAGNOSIS — R161 Splenomegaly, not elsewhere classified: Secondary | ICD-10-CM | POA: Diagnosis not present

## 2020-03-09 DIAGNOSIS — K5900 Constipation, unspecified: Secondary | ICD-10-CM

## 2020-03-09 NOTE — Progress Notes (Signed)
Primary Care Physician:  Gwenlyn Fudge, FNP Primary Gastroenterologist:  Dr. Jena Gauss  Pre-Procedure History & Physical: HPI:  Brenda Lamb is a 59 y.o. female here for follow-up of a right upper quadrant abdominal pain.  Recent CT demonstrated abnormal adnexa/ovaries hepatic, hepatic steatosis, splenomegaly, perigastric varices reconfirmed from prior CT.  Gallbladder distended.  No obvious stones on CT.  Previous gallbladder ultrasound earlier this year negative for stones. History of erosive reflux esophagitis.  Patient came off her PPI earlier this year. Recent EGD demonstrated no portal gastropathy or varices anywhere in the upper GI tract.  Mild appearing gastric inflammation.  Gastric biopsies revealed mild reactive gastropathy.  No H. Pylori.  GYN evaluation with pelvic ultrasound obtained since she was last seen.  Radiographic findings noted previously felt to be benign per patient report.  Last colonoscopy 2006-[facility] -diverticulosis only.  HIDA 2017 gallbladder EF 50%  Past Medical History:  Diagnosis Date   Acid reflux    Fatty liver 09/30/2019   Hyperlipidemia    Sleep apnea    Vitamin D deficiency     Past Surgical History:  Procedure Laterality Date   BIOPSY  02/02/2020   Procedure: BIOPSY;  Surgeon: Corbin Ade, MD;  Location: [facility];  Service: Endoscopy;;   BREAST LUMPECTOMY     left breast lump   CARPAL TUNNEL RELEASE Bilateral    ELBOW SURGERY     nerve release right elbow   ESOPHAGEAL BANDING N/A 02/02/2020   Procedure: ESOPHAGEAL BANDING;  Surgeon: Corbin Ade, MD;  Location: [facility];  Service: Endoscopy;  Laterality: N/A;   ESOPHAGOGASTRODUODENOSCOPY  10/2015   Dr. Noe Gens in [facility]: Esophageal nodule measuring 10 mm at 20 cm in the esophagus.  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  Esophagitis noted at 4 cm above the GE junction.  Gastritis.  8 mm gastric  ulcer.  Multiple gastric biopsy showed chronic inflammation but no H. pylori.   ESOPHAGOGASTRODUODENOSCOPY  12/2015   Dr. Noe Gens at [facility]: Esophagitis at 4 cm above the GE junction, gastritis, gastric ulcer healed.  Esophageal and gastric biopsies benign   ESOPHAGOGASTRODUODENOSCOPY (EGD) WITH PROPOFOL N/A 02/02/2020   Procedure: ESOPHAGOGASTRODUODENOSCOPY (EGD) WITH PROPOFOL;  Surgeon: Corbin Ade, MD;  Location: [facility];  Service: Endoscopy;  Laterality: N/A;  1:15pm   GANGLION CYST EXCISION     HAND SURGERY     trigger fingers   KNEE SURGERY     right knee arthoplasty   REPLACEMENT TOTAL KNEE Right 12/2017   SHOULDER ARTHROSCOPY Bilateral    rotator cuff repairs    Prior to Admission medications   Medication Sig Start Date End Date Taking? Authorizing Provider  aspirin EC 81 MG tablet Take 81 mg by mouth daily.   Yes [provider]  rosuvastatin (CRESTOR) 10 MG tablet TAKE 1 TABLET BY MOUTH EVERY DAY Patient taking differently: Take 10 mg by mouth daily. 12/12/19  Yes [name] F, FNP    Allergies as of 03/09/2020 - Review Complete 03/09/2020  Allergen Reaction Noted   Ranitidine Swelling 08/06/2019    Family History  Problem Relation Age of Onset   Heart disease Mother    Heart attack Mother    COPD Father    Heart disease Father    Ovarian cancer Sister 34   Leukemia Sister 35   Obesity Brother    Colon cancer Neg Hx     Social  History   Socioeconomic History   Marital status: Married    Spouse name: Not on file   Number of children: Not on file   Years of education: Not on file   Highest education level: Not on file  Occupational History   Not on file  Tobacco Use   Smoking status: Never Smoker   Smokeless tobacco: Never Used  Vaping Use   Vaping Use: Never used  Substance and Sexual Activity   Alcohol use: Yes    Comment: social    Drug use: Never   Sexual activity: Not Currently    Birth  control/protection: Post-menopausal  Other Topics Concern   Not on file  Social History Narrative   Not on file   Social Determinants of Health   Financial Resource Strain: High Risk   Difficulty of Paying Living Expenses: Hard  Food Insecurity: No Food Insecurity   Worried About Running Out of Food in the Last Year: Never true   Ran Out of Food in the Last Year: Never true  Transportation Needs: No Transportation Needs   Lack of Transportation (Medical): No   Lack of Transportation (Non-Medical): No  Physical Activity: Inactive   Days of Exercise per Week: 0 days   Minutes of Exercise per Session: 0 min  Stress: No Stress Concern Present   Feeling of Stress : Not at all  Social Connections: Socially Isolated   Frequency of Communication with Friends and Family: Once a week   Frequency of Social Gatherings with Friends and Family: Once a week   Attends Religious Services: Never   Database administrator or Organizations: No   Attends Engineer, structural: Never   Marital Status: Married  Catering manager Violence: Not At Risk   Fear of Current or Ex-Partner: No   Emotionally Abused: No   Physically Abused: No   Sexually Abused: No    Review of Systems: See HPI, otherwise negative ROS  Physical Exam: BP 127/82    Pulse 96    Temp 97.7 F (36.5 C)    Ht 4\' 11"  (1.499 m)    Wt 231 lb 12.8 oz (105.1 kg)    BMI 46.82 kg/m  General:   Alert, well-nourished, pleasant and cooperative in NAD.  Central obesity present. Neck:  Supple; no masses or thyromegaly. No significant cervical adenopathy. Lungs:  Clear throughout to auscultation.   No wheezes, crackles, or rhonchi. No acute distress. Heart:  Regular rate and rhythm; no murmurs, clicks, rubs,  or gallops. Abdomen: Obese.  Soft and nontender without obvious mass organomegaly  Pulses:  Normal pulses noted. Extremities:  Without clubbing or edema.  Impression/Plan: 59 year old lady with  significant central obesity, prediabetes found to have persisting per-gastric varices and splenomegaly on cross-sectional imaging.  However, EGD demonstrated no stigmata of liver disease no way varices or portal hypertension. CT findings compelling for chronic liver disease.  She appears to have very well preserved hepatic synthetic function at this time and has had no decompensation.  Etiology of her right upper quadrant abdominal pain not clear.  She does have a history of erosive reflux esophagitis on prior EGD and is not on any acid suppression therapy at this time.  Pain could be related to reflux.  She is susceptible hepatitis A and B based on serologies.   Recommendations:  Schedule vaccination for hepatitis A and B  Hepatic profile, INR, CBC, BMET, AMA,ANA, Anti smooth muscle antibody, serum immunoglobulins  Weight loss and becoming more fit (  aerobic exercise 30 minutes 3x weekly)  OV in 6 months  Ultrasound of liver  - screening 6 months  Repeat EGD for screening 2 years.  Protonix 40 mg daily for reflux (disp 30 with 11 refills) for reflux.  Let us know how your abdominal pain is doing in one month on Protonix - if no better, further evaluation may be needed.     Notice: This dictation was prepared with Dragon dictation along with smaller phrase technology. Any transcriptional errors that result from this process are unintentional and may not be corrected upon review.

## 2020-03-09 NOTE — Patient Instructions (Addendum)
Schedule vaccination for hepatitis A and B  Hepatic profile, INR, CBC, BMET, AMA,ANA, Anti smooth muscle antibody, serum immunoglobulins  Weight loss and becoming more fit (aerobic exercise 30 minutes 3x weekly)  OV in 6 months  Ultrasound of liver  - screening 6 months  Protonix 40 mg daily for reflux (disp 30 with 11 refills) for reflux.  Let us know how your abdominal pain is doing in one month on Protonix - if no better, further testing may be needed.

## 2020-03-10 ENCOUNTER — Other Ambulatory Visit: Payer: Self-pay

## 2020-03-10 ENCOUNTER — Other Ambulatory Visit: Payer: Self-pay | Admitting: Family Medicine

## 2020-03-10 DIAGNOSIS — K760 Fatty (change of) liver, not elsewhere classified: Secondary | ICD-10-CM

## 2020-03-10 DIAGNOSIS — E782 Mixed hyperlipidemia: Secondary | ICD-10-CM

## 2020-03-10 DIAGNOSIS — K5900 Constipation, unspecified: Secondary | ICD-10-CM

## 2020-03-10 MED ORDER — PANTOPRAZOLE SODIUM 40 MG PO TBEC: 40.0000 mg | DELAYED_RELEASE_TABLET | Freq: Every day | ORAL | 11 refills | Status: DC

## 2020-03-11 LAB — CBC WITH DIFFERENTIAL/PLATELET
Absolute Monocytes: 623 cells/uL (ref 200–950)
Basophils Absolute: 33 cells/uL (ref 0–200)
Basophils Relative: 0.4 %
Eosinophils Absolute: 123 cells/uL (ref 15–500)
Eosinophils Relative: 1.5 %
HCT: 38.5 % (ref 35.0–45.0)
Hemoglobin: 12.9 g/dL (ref 11.7–15.5)
Lymphs Abs: 2214 cells/uL (ref 850–3900)
MCH: 29.9 pg (ref 27.0–33.0)
MCHC: 33.5 g/dL (ref 32.0–36.0)
MCV: 89.3 fL (ref 80.0–100.0)
MPV: 9.1 fL (ref 7.5–12.5)
Monocytes Relative: 7.6 %
Neutro Abs: 5207 cells/uL (ref 1500–7800)
Neutrophils Relative %: 63.5 %
Platelets: 270 10*3/uL (ref 140–400)
RBC: 4.31 10*6/uL (ref 3.80–5.10)
RDW: 12.9 % (ref 11.0–15.0)
Total Lymphocyte: 27 %
WBC: 8.2 10*3/uL (ref 3.8–10.8)

## 2020-03-11 LAB — IGG, IGA, IGM
IgG (Immunoglobin G), Serum: 936 mg/dL (ref 600–1640)
IgM, Serum: 43 mg/dL — ABNORMAL LOW (ref 50–300)
Immunoglobulin A: 118 mg/dL (ref 47–310)

## 2020-03-11 LAB — HEPATIC FUNCTION PANEL
AG Ratio: 1.7 (calc) (ref 1.0–2.5)
ALT: 23 U/L (ref 6–29)
AST: 41 U/L — ABNORMAL HIGH (ref 10–35)
Albumin: 4.3 g/dL (ref 3.6–5.1)
Alkaline phosphatase (APISO): 74 U/L (ref 37–153)
Bilirubin, Direct: 0.1 mg/dL (ref 0.0–0.2)
Globulin: 2.6 g/dL (calc) (ref 1.9–3.7)
Indirect Bilirubin: 0.6 mg/dL (calc) (ref 0.2–1.2)
Total Bilirubin: 0.7 mg/dL (ref 0.2–1.2)
Total Protein: 6.9 g/dL (ref 6.1–8.1)

## 2020-03-11 LAB — BASIC METABOLIC PANEL
BUN: 12 mg/dL (ref 7–25)
CO2: 29 mmol/L (ref 20–32)
Calcium: 9.4 mg/dL (ref 8.6–10.4)
Chloride: 102 mmol/L (ref 98–110)
Creat: 0.86 mg/dL (ref 0.50–1.05)
Glucose, Bld: 110 mg/dL — ABNORMAL HIGH (ref 65–99)
Potassium: 3.9 mmol/L (ref 3.5–5.3)
Sodium: 140 mmol/L (ref 135–146)

## 2020-03-11 LAB — ANA: Anti Nuclear Antibody (ANA): NEGATIVE

## 2020-03-11 LAB — PROTIME-INR
INR: 1
Prothrombin Time: 10.6 s (ref 9.0–11.5)

## 2020-03-11 LAB — ANTI-SMOOTH MUSCLE ANTIBODY, IGG: Actin (Smooth Muscle) Antibody (IGG): <20 U (ref <20)

## 2020-03-11 LAB — IGE: IgE (Immunoglobulin E), Serum: 82 kU/L (ref <=114)

## 2020-06-05 ENCOUNTER — Other Ambulatory Visit: Payer: Self-pay | Admitting: Family Medicine

## 2020-06-05 DIAGNOSIS — E782 Mixed hyperlipidemia: Secondary | ICD-10-CM

## 2020-06-20 ENCOUNTER — Other Ambulatory Visit: Payer: Self-pay | Admitting: Family Medicine

## 2020-06-20 DIAGNOSIS — E782 Mixed hyperlipidemia: Secondary | ICD-10-CM

## 2020-09-16 ENCOUNTER — Ambulatory Visit: Admit: 2020-09-16 | Discharge: 2020-09-17

## 2020-09-16 DIAGNOSIS — Z7689 Persons encountering health services in other specified circumstances: Principal | ICD-10-CM

## 2020-09-16 DIAGNOSIS — Z1231 Encounter for screening mammogram for malignant neoplasm of breast: Principal | ICD-10-CM

## 2020-09-16 DIAGNOSIS — E785 Hyperlipidemia, unspecified: Principal | ICD-10-CM

## 2020-09-16 DIAGNOSIS — Z1211 Encounter for screening for malignant neoplasm of colon: Principal | ICD-10-CM

## 2020-09-16 DIAGNOSIS — Z9289 Personal history of other medical treatment: Principal | ICD-10-CM

## 2020-09-16 DIAGNOSIS — R7303 Prediabetes: Principal | ICD-10-CM

## 2020-09-16 DIAGNOSIS — M25562 Pain in left knee: Principal | ICD-10-CM

## 2020-09-16 DIAGNOSIS — G8929 Other chronic pain: Principal | ICD-10-CM

## 2020-09-16 DIAGNOSIS — M25561 Pain in right knee: Principal | ICD-10-CM

## 2020-09-16 DIAGNOSIS — R5383 Other fatigue: Principal | ICD-10-CM

## 2020-09-16 MED ORDER — ROSUVASTATIN 10 MG TABLET
ORAL_TABLET | Freq: Every day | ORAL | 3 refills | 90 days | Status: CP
Start: 2020-09-16 — End: ?

## 2020-09-17 DIAGNOSIS — Z794 Long term (current) use of insulin: Principal | ICD-10-CM

## 2020-09-17 DIAGNOSIS — E785 Hyperlipidemia, unspecified: Principal | ICD-10-CM

## 2020-09-17 DIAGNOSIS — E1165 Type 2 diabetes mellitus with hyperglycemia: Principal | ICD-10-CM

## 2020-09-17 MED ORDER — ROSUVASTATIN 20 MG TABLET
ORAL_TABLET | Freq: Every day | ORAL | 1 refills | 90 days | Status: CP
Start: 2020-09-17 — End: ?

## 2020-09-17 MED ORDER — SITAGLIPTIN 25 MG TABLET
ORAL_TABLET | Freq: Every day | ORAL | 3 refills | 90.00000 days | Status: CP
Start: 2020-09-17 — End: 2021-09-17

## 2020-09-17 MED ORDER — METFORMIN 500 MG TABLET
ORAL_TABLET | Freq: Two times a day (BID) | ORAL | 11 refills | 30 days | Status: CP
Start: 2020-09-17 — End: 2021-09-17

## 2020-09-17 MED ORDER — LANCETS
0 refills | 0 days | Status: CP
Start: 2020-09-17 — End: ?

## 2020-09-17 MED ORDER — BLOOD GLUCOSE TEST STRIPS
ORAL_STRIP | 0 refills | 0 days | Status: CP
Start: 2020-09-17 — End: ?

## 2020-09-17 MED ORDER — BLOOD-GLUCOSE METER KIT WRAPPER
0 refills | 0 days | Status: CP
Start: 2020-09-17 — End: ?

## 2020-09-21 DIAGNOSIS — Z794 Long term (current) use of insulin: Principal | ICD-10-CM

## 2020-09-21 DIAGNOSIS — E785 Hyperlipidemia, unspecified: Principal | ICD-10-CM

## 2020-09-21 DIAGNOSIS — E1165 Type 2 diabetes mellitus with hyperglycemia: Principal | ICD-10-CM

## 2020-09-21 MED ORDER — METFORMIN 500 MG TABLET
ORAL_TABLET | 11 refills | 0 days | Status: CP
Start: 2020-09-21 — End: ?

## 2020-09-21 MED ORDER — ROSUVASTATIN 20 MG TABLET
ORAL_TABLET | Freq: Every day | ORAL | 1 refills | 90 days | Status: CP
Start: 2020-09-21 — End: ?

## 2020-10-04 ENCOUNTER — Ambulatory Visit: Admit: 2020-10-04 | Discharge: 2020-10-05

## 2020-10-04 DIAGNOSIS — Z01419 Encounter for gynecological examination (general) (routine) without abnormal findings: Principal | ICD-10-CM

## 2020-10-04 DIAGNOSIS — Z8041 Family history of malignant neoplasm of ovary: Principal | ICD-10-CM

## 2020-10-04 DIAGNOSIS — Z9289 Personal history of other medical treatment: Principal | ICD-10-CM

## 2020-10-05 MED ORDER — PHENTERMINE 37.5 MG TABLET
ORAL_TABLET | Freq: Every day | ORAL | 0 refills | 30 days | Status: CP
Start: 2020-10-05 — End: ?

## 2020-10-11 ENCOUNTER — Ambulatory Visit: Admit: 2020-10-11 | Discharge: 2020-10-12

## 2020-10-11 DIAGNOSIS — Z1211 Encounter for screening for malignant neoplasm of colon: Principal | ICD-10-CM

## 2020-10-11 DIAGNOSIS — K625 Hemorrhage of anus and rectum: Principal | ICD-10-CM

## 2020-10-21 ENCOUNTER — Encounter: Admit: 2020-10-21 | Discharge: 2020-10-21 | Attending: Anesthesiology | Primary: Anesthesiology

## 2020-10-21 ENCOUNTER — Ambulatory Visit: Admit: 2020-10-21 | Discharge: 2020-10-21

## 2020-10-27 ENCOUNTER — Ambulatory Visit: Admit: 2020-10-27 | Discharge: 2020-10-28

## 2020-11-02 ENCOUNTER — Ambulatory Visit: Admit: 2020-11-02 | Discharge: 2020-11-03

## 2020-11-02 DIAGNOSIS — R21 Rash and other nonspecific skin eruption: Principal | ICD-10-CM

## 2020-11-02 DIAGNOSIS — E1165 Type 2 diabetes mellitus with hyperglycemia: Principal | ICD-10-CM

## 2020-11-02 MED ORDER — GLIPIZIDE 5 MG TABLET
ORAL_TABLET | Freq: Every day | ORAL | 11 refills | 30.00000 days | Status: CP
Start: 2020-11-02 — End: 2021-11-02

## 2020-11-02 MED ORDER — TRIAMCINOLONE ACETONIDE 0.1 % TOPICAL CREAM
Freq: Two times a day (BID) | TOPICAL | 0 refills | 0.00000 days | Status: CP
Start: 2020-11-02 — End: 2021-11-02

## 2020-11-02 MED ORDER — LISINOPRIL 5 MG TABLET
ORAL_TABLET | Freq: Every day | ORAL | 11 refills | 60.00000 days | Status: CP
Start: 2020-11-02 — End: 2020-11-02

## 2020-11-05 MED ORDER — LORATADINE 10 MG TABLET
ORAL_TABLET | Freq: Every day | ORAL | 2 refills | 30 days | Status: CP
Start: 2020-11-05 — End: 2021-11-05

## 2020-11-05 MED ORDER — PREDNISONE 20 MG TABLET
ORAL_TABLET | 0 refills | 0 days | Status: CP
Start: 2020-11-05 — End: ?

## 2020-11-25 DIAGNOSIS — R21 Rash and other nonspecific skin eruption: Principal | ICD-10-CM

## 2020-11-25 DIAGNOSIS — E785 Hyperlipidemia, unspecified: Principal | ICD-10-CM

## 2020-11-25 DIAGNOSIS — E1165 Type 2 diabetes mellitus with hyperglycemia: Principal | ICD-10-CM

## 2020-11-25 DIAGNOSIS — Z760 Encounter for issue of repeat prescription: Principal | ICD-10-CM

## 2020-11-25 DIAGNOSIS — Z794 Long term (current) use of insulin: Principal | ICD-10-CM

## 2020-11-25 MED ORDER — LANCETS
0 refills | 0 days | Status: CP
Start: 2020-11-25 — End: ?
  Filled 2020-11-26: qty 100, 20d supply, fill #0

## 2020-11-25 MED ORDER — ASPIRIN 81 MG CHEWABLE TABLET
ORAL_TABLET | Freq: Every day | ORAL | 6 refills | 30 days | Status: CP
Start: 2020-11-25 — End: 2020-12-25
  Filled 2020-11-26: qty 30, 30d supply, fill #0

## 2020-11-25 MED ORDER — LISINOPRIL 5 MG TABLET
ORAL_TABLET | Freq: Every day | ORAL | 3 refills | 90 days | Status: CP
Start: 2020-11-25 — End: 2021-11-25
  Filled 2020-11-26: qty 45, 90d supply, fill #0

## 2020-11-25 MED ORDER — ROSUVASTATIN 20 MG TABLET
ORAL_TABLET | Freq: Every day | ORAL | 1 refills | 90 days | Status: CP
Start: 2020-11-25 — End: ?
  Filled 2020-11-26: qty 90, 90d supply, fill #0

## 2020-11-25 MED ORDER — METFORMIN 500 MG TABLET
ORAL_TABLET | 11 refills | 0 days | Status: CP
Start: 2020-11-25 — End: ?
  Filled 2020-11-26: qty 60, 24d supply, fill #0

## 2020-11-25 MED ORDER — GLIPIZIDE 5 MG TABLET
ORAL_TABLET | Freq: Every day | ORAL | 11 refills | 30 days | Status: CP
Start: 2020-11-25 — End: 2021-11-25
  Filled 2020-11-26: qty 30, 30d supply, fill #0

## 2020-11-25 MED ORDER — LORATADINE 10 MG TABLET
ORAL_TABLET | Freq: Every day | ORAL | 2 refills | 30 days | Status: CP
Start: 2020-11-25 — End: 2021-11-25
  Filled 2020-12-20: qty 30, 30d supply, fill #0

## 2020-11-25 MED ORDER — BLOOD GLUCOSE TEST STRIPS
ORAL_STRIP | 0 refills | 0 days | Status: CP
Start: 2020-11-25 — End: ?
  Filled 2020-11-26: qty 100, 20d supply, fill #0

## 2020-11-26 DIAGNOSIS — Z794 Long term (current) use of insulin: Principal | ICD-10-CM

## 2020-11-26 DIAGNOSIS — E1165 Type 2 diabetes mellitus with hyperglycemia: Principal | ICD-10-CM

## 2020-11-26 MED ORDER — LANCING DEVICE
0 refills | 0 days
Start: 2020-11-26 — End: ?

## 2020-11-26 MED ORDER — BLOOD-GLUCOSE METER
0 refills | 0 days | Status: CP
Start: 2020-11-26 — End: ?
  Filled 2020-11-26: qty 1, 1d supply, fill #0

## 2020-11-26 MED FILL — ON CALL LANCING DEVICE: 30 days supply | Qty: 1 | Fill #0

## 2020-12-17 DIAGNOSIS — Z794 Long term (current) use of insulin: Principal | ICD-10-CM

## 2020-12-17 DIAGNOSIS — R21 Rash and other nonspecific skin eruption: Principal | ICD-10-CM

## 2020-12-17 DIAGNOSIS — E1165 Type 2 diabetes mellitus with hyperglycemia: Principal | ICD-10-CM

## 2020-12-17 DIAGNOSIS — Z760 Encounter for issue of repeat prescription: Principal | ICD-10-CM

## 2020-12-20 MED ORDER — BLOOD-GLUCOSE METER KIT WRAPPER
0 refills | 0 days | Status: CP
Start: 2020-12-20 — End: 2021-12-20

## 2020-12-20 MED ORDER — LANCETS 30 GAUGE
0 refills | 0 days | Status: CP
Start: 2020-12-20 — End: ?
  Filled 2020-12-20: qty 100, 33d supply, fill #0

## 2020-12-20 MED ORDER — ON CALL EXPRESS TEST STRIP
0 refills | 0 days | Status: CP
Start: 2020-12-20 — End: ?
  Filled 2020-12-20: qty 100, 33d supply, fill #0

## 2020-12-20 MED FILL — ASPIRIN 81 MG CHEWABLE TABLET: ORAL | 30 days supply | Qty: 30 | Fill #1

## 2020-12-20 MED FILL — METFORMIN 500 MG TABLET: ORAL | 30 days supply | Qty: 120 | Fill #1

## 2020-12-20 MED FILL — GLIPIZIDE 5 MG TABLET: ORAL | 30 days supply | Qty: 30 | Fill #1

## 2020-12-30 ENCOUNTER — Ambulatory Visit: Admit: 2020-12-30 | Discharge: 2020-12-30

## 2021-01-14 DIAGNOSIS — Z760 Encounter for issue of repeat prescription: Principal | ICD-10-CM

## 2021-01-14 DIAGNOSIS — Z794 Long term (current) use of insulin: Principal | ICD-10-CM

## 2021-01-14 DIAGNOSIS — E1165 Type 2 diabetes mellitus with hyperglycemia: Principal | ICD-10-CM

## 2021-01-14 MED ORDER — ON CALL EXPRESS TEST STRIP
0 refills | 0 days | Status: CP
Start: 2021-01-14 — End: ?
  Filled 2021-01-17: qty 100, 25d supply, fill #0

## 2021-01-14 MED ORDER — LANCETS 30 GAUGE
0 refills | 0 days | Status: CP
Start: 2021-01-14 — End: ?
  Filled 2021-01-17: qty 100, 25d supply, fill #0

## 2021-01-17 MED FILL — METFORMIN 500 MG TABLET: ORAL | 30 days supply | Qty: 120 | Fill #2

## 2021-01-17 MED FILL — ASPIRIN 81 MG CHEWABLE TABLET: ORAL | 30 days supply | Qty: 30 | Fill #2

## 2021-01-17 MED FILL — GLIPIZIDE 5 MG TABLET: ORAL | 30 days supply | Qty: 30 | Fill #2

## 2021-02-15 DIAGNOSIS — Z794 Long term (current) use of insulin: Principal | ICD-10-CM

## 2021-02-15 DIAGNOSIS — Z760 Encounter for issue of repeat prescription: Principal | ICD-10-CM

## 2021-02-15 DIAGNOSIS — R1011 Right upper quadrant pain: Principal | ICD-10-CM

## 2021-02-15 DIAGNOSIS — E1165 Type 2 diabetes mellitus with hyperglycemia: Principal | ICD-10-CM

## 2021-02-15 MED ORDER — ON CALL EXPRESS TEST STRIP
0 refills | 0 days | Status: CP
Start: 2021-02-15 — End: ?
  Filled 2021-02-16: qty 100, 25d supply, fill #0

## 2021-02-15 MED ORDER — LANCETS 30 GAUGE
0 refills | 0 days | Status: CP
Start: 2021-02-15 — End: ?
  Filled 2021-02-16: qty 100, 25d supply, fill #0

## 2021-02-15 MED ORDER — BENZONATATE 100 MG CAPSULE
ORAL_CAPSULE | Freq: Three times a day (TID) | ORAL | 1 refills | 10 days | Status: CP | PRN
Start: 2021-02-15 — End: 2022-02-15

## 2021-02-16 MED FILL — GLIPIZIDE 5 MG TABLET: ORAL | 30 days supply | Qty: 30 | Fill #3

## 2021-02-16 MED FILL — METFORMIN 500 MG TABLET: ORAL | 30 days supply | Qty: 120 | Fill #3

## 2021-02-16 MED FILL — LISINOPRIL 5 MG TABLET: ORAL | 90 days supply | Qty: 45 | Fill #1

## 2021-02-16 MED FILL — ASPIRIN 81 MG CHEWABLE TABLET: ORAL | 30 days supply | Qty: 30 | Fill #3

## 2021-02-16 MED FILL — ROSUVASTATIN 20 MG TABLET: ORAL | 90 days supply | Qty: 90 | Fill #1

## 2021-02-22 ENCOUNTER — Ambulatory Visit: Admit: 2021-02-22 | Discharge: 2021-02-23

## 2021-02-22 ENCOUNTER — Ambulatory Visit: Admit: 2021-02-22 | Discharge: 2021-02-22 | Disposition: A | Discharge status: Home / Self Care | Payer: MEDICARE

## 2021-02-22 ENCOUNTER — Emergency Department: Admit: 2021-02-22 | Discharge: 2021-02-22 | Disposition: A | Discharge status: Home / Self Care | Payer: MEDICARE

## 2021-02-22 DIAGNOSIS — K819 Cholecystitis, unspecified: Principal | ICD-10-CM

## 2021-02-22 DIAGNOSIS — K8500 Idiopathic acute pancreatitis without necrosis or infection: Principal | ICD-10-CM

## 2021-02-22 DIAGNOSIS — R1011 Right upper quadrant pain: Principal | ICD-10-CM

## 2021-02-22 MED ORDER — ONDANSETRON 4 MG DISINTEGRATING TABLET
ORAL_TABLET | Freq: Three times a day (TID) | ORAL | 0 refills | 4.00000 days | Status: CP | PRN
Start: 2021-02-22 — End: 2021-03-01

## 2021-02-22 MED ORDER — HYDROCODONE 5 MG-ACETAMINOPHEN 325 MG TABLET
ORAL_TABLET | ORAL | 0 refills | 2 days | Status: CP | PRN
Start: 2021-02-22 — End: 2021-02-27

## 2021-02-24 ENCOUNTER — Ambulatory Visit: Admit: 2021-02-24 | Discharge: 2021-02-25

## 2021-02-24 DIAGNOSIS — M546 Pain in thoracic spine: Principal | ICD-10-CM

## 2021-02-24 DIAGNOSIS — R109 Unspecified abdominal pain: Principal | ICD-10-CM

## 2021-02-24 MED ORDER — ACETAMINOPHEN 500 MG TABLET
ORAL_TABLET | Freq: Four times a day (QID) | ORAL | 2 refills | 13 days | Status: CP | PRN
Start: 2021-02-24 — End: 2022-02-24
  Filled 2021-03-02: qty 100, 13d supply, fill #0

## 2021-03-01 DIAGNOSIS — Z794 Long term (current) use of insulin: Principal | ICD-10-CM

## 2021-03-01 DIAGNOSIS — E1165 Type 2 diabetes mellitus with hyperglycemia: Principal | ICD-10-CM

## 2021-03-01 DIAGNOSIS — Z760 Encounter for issue of repeat prescription: Principal | ICD-10-CM

## 2021-03-03 ENCOUNTER — Ambulatory Visit: Admit: 2021-03-03 | Discharge: 2021-03-04 | Attending: Surgery | Primary: Surgery

## 2021-03-03 DIAGNOSIS — K8050 Calculus of bile duct without cholangitis or cholecystitis without obstruction: Principal | ICD-10-CM

## 2021-03-03 DIAGNOSIS — K760 Fatty (change of) liver, not elsewhere classified: Principal | ICD-10-CM

## 2021-03-03 DIAGNOSIS — K824 Cholesterolosis of gallbladder: Principal | ICD-10-CM

## 2021-03-16 ENCOUNTER — Ambulatory Visit: Admit: 2021-03-16 | Discharge: 2021-03-17

## 2021-03-18 ENCOUNTER — Ambulatory Visit: Admit: 2021-03-18 | Discharge: 2021-03-18

## 2021-03-18 DIAGNOSIS — Z760 Encounter for issue of repeat prescription: Principal | ICD-10-CM

## 2021-03-18 DIAGNOSIS — E785 Hyperlipidemia, unspecified: Principal | ICD-10-CM

## 2021-03-18 DIAGNOSIS — Z794 Long term (current) use of insulin: Principal | ICD-10-CM

## 2021-03-18 DIAGNOSIS — E1165 Type 2 diabetes mellitus with hyperglycemia: Principal | ICD-10-CM

## 2021-03-18 MED ORDER — ONDANSETRON 4 MG DISINTEGRATING TABLET
ORAL_TABLET | Freq: Three times a day (TID) | ORAL | 0 refills | 3 days | Status: CP | PRN
Start: 2021-03-18 — End: 2021-03-25

## 2021-03-18 MED ORDER — ONDANSETRON HCL 4 MG TABLET
ORAL_TABLET | Freq: Three times a day (TID) | ORAL | 0 refills | 3 days | Status: CP | PRN
Start: 2021-03-18 — End: 2021-03-25

## 2021-03-18 MED ORDER — DOCUSATE SODIUM 100 MG CAPSULE
ORAL_CAPSULE | Freq: Every day | ORAL | 0 refills | 30.00000 days | Status: CP
Start: 2021-03-18 — End: 2021-04-17

## 2021-03-18 MED ORDER — OXYCODONE 5 MG TABLET
ORAL_TABLET | ORAL | 0 refills | 2.00000 days | Status: CP | PRN
Start: 2021-03-18 — End: 2021-03-23

## 2021-03-21 MED ORDER — ROSUVASTATIN 20 MG TABLET
ORAL_TABLET | Freq: Every day | ORAL | 1 refills | 90.00000 days | Status: CP
Start: 2021-03-21 — End: ?
  Filled 2021-05-27: qty 90, 90d supply, fill #0

## 2021-03-21 MED ORDER — METFORMIN 500 MG TABLET
ORAL_TABLET | Freq: Two times a day (BID) | ORAL | 0 refills | 90 days | Status: CP
Start: 2021-03-21 — End: ?
  Filled 2021-03-29: qty 180, 90d supply, fill #0

## 2021-03-21 MED ORDER — ON CALL EXPRESS TEST STRIP
0 refills | 0 days | Status: CP
Start: 2021-03-21 — End: ?
  Filled 2021-03-22: qty 100, 25d supply, fill #0

## 2021-03-22 ENCOUNTER — Ambulatory Visit: Admit: 2021-03-22 | Discharge: 2021-03-23

## 2021-03-22 DIAGNOSIS — E1165 Type 2 diabetes mellitus with hyperglycemia: Principal | ICD-10-CM

## 2021-03-22 DIAGNOSIS — R1011 Right upper quadrant pain: Principal | ICD-10-CM

## 2021-03-22 DIAGNOSIS — Z760 Encounter for issue of repeat prescription: Principal | ICD-10-CM

## 2021-03-22 DIAGNOSIS — K760 Fatty (change of) liver, not elsewhere classified: Principal | ICD-10-CM

## 2021-03-22 DIAGNOSIS — Z794 Long term (current) use of insulin: Principal | ICD-10-CM

## 2021-03-22 MED ORDER — LANCETS 30 GAUGE
5 refills | 0 days | Status: CP
Start: 2021-03-22 — End: ?
  Filled 2021-03-23: qty 100, 34d supply, fill #0

## 2021-03-22 MED FILL — GLIPIZIDE 5 MG TABLET: ORAL | 30 days supply | Qty: 30 | Fill #4

## 2021-03-22 MED FILL — ASPIRIN 81 MG CHEWABLE TABLET: ORAL | 30 days supply | Qty: 30 | Fill #4

## 2021-03-25 DIAGNOSIS — K760 Fatty (change of) liver, not elsewhere classified: Principal | ICD-10-CM

## 2021-03-31 ENCOUNTER — Ambulatory Visit: Admit: 2021-03-31 | Discharge: 2021-04-01 | Attending: Surgery | Primary: Surgery

## 2021-03-31 DIAGNOSIS — K811 Chronic cholecystitis: Principal | ICD-10-CM

## 2021-03-31 DIAGNOSIS — K824 Cholesterolosis of gallbladder: Principal | ICD-10-CM

## 2021-03-31 DIAGNOSIS — R300 Dysuria: Principal | ICD-10-CM

## 2021-03-31 DIAGNOSIS — Z9049 Acquired absence of other specified parts of digestive tract: Principal | ICD-10-CM

## 2021-03-31 MED ORDER — NITROFURANTOIN MONOHYDRATE/MACROCRYSTALS 100 MG CAPSULE
ORAL_CAPSULE | Freq: Two times a day (BID) | ORAL | 0 refills | 5 days | Status: CP
Start: 2021-03-31 — End: 2021-04-05

## 2021-04-01 DIAGNOSIS — R7303 Prediabetes: Principal | ICD-10-CM

## 2021-04-01 MED ORDER — METFORMIN 500 MG TABLET
ORAL_TABLET | Freq: Two times a day (BID) | ORAL | 0 refills | 90.00000 days | Status: CP
Start: 2021-04-01 — End: ?
  Filled 2021-06-09: qty 180, 90d supply, fill #0

## 2021-04-05 DIAGNOSIS — R300 Dysuria: Principal | ICD-10-CM

## 2021-04-14 DIAGNOSIS — Z760 Encounter for issue of repeat prescription: Principal | ICD-10-CM

## 2021-04-14 DIAGNOSIS — E1165 Type 2 diabetes mellitus with hyperglycemia: Principal | ICD-10-CM

## 2021-04-14 DIAGNOSIS — Z794 Long term (current) use of insulin: Principal | ICD-10-CM

## 2021-04-15 MED ORDER — ON CALL EXPRESS TEST STRIP
6 refills | 0 days | Status: CP
Start: 2021-04-15 — End: ?
  Filled 2021-04-29: qty 100, 25d supply, fill #0

## 2021-04-20 MED FILL — ASPIRIN 81 MG CHEWABLE TABLET: ORAL | 30 days supply | Qty: 30 | Fill #5

## 2021-04-20 MED FILL — ON CALL LANCET 30 GAUGE: 34 days supply | Qty: 100 | Fill #1

## 2021-04-29 MED FILL — LISINOPRIL 5 MG TABLET: ORAL | 90 days supply | Qty: 45 | Fill #2

## 2021-05-10 ENCOUNTER — Ambulatory Visit: Admit: 2021-05-10 | Discharge: 2021-05-11 | Attending: Internal Medicine | Primary: Internal Medicine

## 2021-05-10 DIAGNOSIS — K760 Fatty (change of) liver, not elsewhere classified: Principal | ICD-10-CM

## 2021-05-26 ENCOUNTER — Encounter: Admit: 2021-05-26 | Discharge: 2021-05-26 | Attending: Nurse Practitioner | Primary: Nurse Practitioner

## 2021-05-26 ENCOUNTER — Ambulatory Visit: Admit: 2021-05-26 | Discharge: 2021-05-26

## 2021-05-27 MED FILL — ASPIRIN 81 MG CHEWABLE TABLET: ORAL | 30 days supply | Qty: 30 | Fill #6

## 2021-05-27 MED FILL — ON CALL EXPRESS TEST STRIP: 25 days supply | Qty: 100 | Fill #1

## 2021-05-27 MED FILL — ON CALL LANCET 30 GAUGE: 34 days supply | Qty: 100 | Fill #2

## 2021-06-25 DIAGNOSIS — Z760 Encounter for issue of repeat prescription: Principal | ICD-10-CM

## 2021-06-25 DIAGNOSIS — R7303 Prediabetes: Principal | ICD-10-CM

## 2021-06-27 MED ORDER — METFORMIN 500 MG TABLET
ORAL_TABLET | Freq: Two times a day (BID) | ORAL | 0 refills | 90 days | Status: CP
Start: 2021-06-27 — End: ?

## 2021-06-28 MED ORDER — ASPIRIN 81 MG CHEWABLE TABLET
ORAL_TABLET | Freq: Every day | ORAL | 6 refills | 30 days | Status: CP
Start: 2021-06-28 — End: 2021-09-26
  Filled 2021-06-28: qty 90, 90d supply, fill #0

## 2021-06-28 MED FILL — ON CALL EXPRESS TEST STRIP: 25 days supply | Qty: 100 | Fill #2

## 2021-06-28 MED FILL — ON CALL LANCET 30 GAUGE: 34 days supply | Qty: 100 | Fill #3

## 2021-07-08 ENCOUNTER — Ambulatory Visit: Admit: 2021-07-08 | Discharge: 2021-07-08

## 2021-07-11 MED FILL — LISINOPRIL 5 MG TABLET: ORAL | 90 days supply | Qty: 45 | Fill #3

## 2021-07-25 DIAGNOSIS — Z760 Encounter for issue of repeat prescription: Principal | ICD-10-CM

## 2021-07-25 DIAGNOSIS — E1165 Type 2 diabetes mellitus with hyperglycemia: Principal | ICD-10-CM

## 2021-07-26 MED ORDER — LISINOPRIL 5 MG TABLET
ORAL_TABLET | Freq: Every day | ORAL | 3 refills | 90 days | Status: CP
Start: 2021-07-26 — End: 2022-07-26

## 2021-07-26 MED FILL — ON CALL LANCET 30 GAUGE: 34 days supply | Qty: 100 | Fill #4

## 2021-07-26 MED FILL — ON CALL EXPRESS TEST STRIP: 25 days supply | Qty: 100 | Fill #3

## 2021-08-01 ENCOUNTER — Ambulatory Visit: Admit: 2021-08-01 | Discharge: 2021-08-02 | Attending: Registered" | Primary: Registered"

## 2021-08-01 ENCOUNTER — Ambulatory Visit: Admit: 2021-08-01 | Discharge: 2021-08-02 | Attending: Clinical | Primary: Clinical

## 2021-08-01 DIAGNOSIS — K760 Fatty (change of) liver, not elsewhere classified: Principal | ICD-10-CM

## 2021-08-01 DIAGNOSIS — Z6841 Body Mass Index (BMI) 40.0 and over, adult: Principal | ICD-10-CM

## 2021-08-01 DIAGNOSIS — E1165 Type 2 diabetes mellitus with hyperglycemia: Principal | ICD-10-CM

## 2021-08-04 MED ORDER — OZEMPIC 0.25 MG OR 0.5 MG (2 MG/3 ML) SUBCUTANEOUS PEN INJECTOR
SUBCUTANEOUS | 0 refills | 42 days | Status: CP
Start: 2021-08-04 — End: 2021-09-15

## 2021-08-12 DIAGNOSIS — E785 Hyperlipidemia, unspecified: Principal | ICD-10-CM

## 2021-08-12 DIAGNOSIS — Z760 Encounter for issue of repeat prescription: Principal | ICD-10-CM

## 2021-08-12 DIAGNOSIS — R7303 Prediabetes: Principal | ICD-10-CM

## 2021-08-12 MED ORDER — METFORMIN 500 MG TABLET
ORAL_TABLET | Freq: Two times a day (BID) | ORAL | 0 refills | 45 days | Status: CP
Start: 2021-08-12 — End: ?

## 2021-08-12 MED FILL — ROSUVASTATIN 20 MG TABLET: ORAL | 90 days supply | Qty: 90 | Fill #1

## 2021-08-23 ENCOUNTER — Ambulatory Visit: Admit: 2021-08-23 | Discharge: 2021-08-24 | Payer: MEDICARE

## 2021-08-23 DIAGNOSIS — R7303 Prediabetes: Principal | ICD-10-CM

## 2021-08-23 DIAGNOSIS — Z760 Encounter for issue of repeat prescription: Principal | ICD-10-CM

## 2021-08-23 DIAGNOSIS — E785 Hyperlipidemia, unspecified: Principal | ICD-10-CM

## 2021-08-23 MED ORDER — LANCETS 30 GAUGE
5 refills | 0 days | Status: CP
Start: 2021-08-23 — End: ?
  Filled 2021-09-16: qty 100, 33d supply, fill #0

## 2021-08-23 MED ORDER — LISINOPRIL 5 MG TABLET
ORAL_TABLET | Freq: Every day | ORAL | 3 refills | 90 days | Status: CP
Start: 2021-08-23 — End: 2022-08-23
  Filled 2021-11-01: qty 45, 90d supply, fill #0

## 2021-08-23 MED ORDER — ROSUVASTATIN 20 MG TABLET
ORAL_TABLET | Freq: Every day | ORAL | 3 refills | 90 days | Status: CP
Start: 2021-08-23 — End: ?

## 2021-08-23 MED ORDER — BLOOD-GLUCOSE METER KIT WRAPPER
0 refills | 0 days | Status: CP
Start: 2021-08-23 — End: 2022-08-23
  Filled 2021-09-16: qty 1, 1d supply, fill #0

## 2021-08-23 MED ORDER — METFORMIN 500 MG TABLET
ORAL_TABLET | Freq: Two times a day (BID) | ORAL | 3 refills | 45 days | Status: CP
Start: 2021-08-23 — End: ?
  Filled 2021-08-23: qty 180, 45d supply, fill #0

## 2021-08-23 MED FILL — ON CALL LANCET 30 GAUGE: 25 days supply | Qty: 100 | Fill #5

## 2021-08-23 MED FILL — ON CALL EXPRESS TEST STRIP: 25 days supply | Qty: 100 | Fill #4

## 2021-08-29 MED ORDER — OZEMPIC 0.25 MG OR 0.5 MG (2 MG/3 ML) SUBCUTANEOUS PEN INJECTOR
SUBCUTANEOUS | 0 refills | 154 days | Status: CP
Start: 2021-08-29 — End: 2022-01-30
  Filled 2021-08-30: qty 15, 154d supply, fill #0

## 2021-09-16 MED FILL — ASPIRIN 81 MG CHEWABLE TABLET: ORAL | 90 days supply | Qty: 90 | Fill #1

## 2021-09-16 MED FILL — ON CALL EXPRESS TEST STRIP: 25 days supply | Qty: 100 | Fill #5

## 2021-10-04 MED FILL — METFORMIN 500 MG TABLET: ORAL | 45 days supply | Qty: 180 | Fill #0

## 2021-10-06 IMAGING — CT CT ABD-PELV W/ CM
2 of 5 series · 14 of 46 positions shown, 16 images · IV contrast (Omnipaque or Isovue)
Comparison: Ten 14-0551
COMPARISON: Ten 14-0551

Addendum:
CLINICAL DATA: Splenomegaly, RIGHT upper quadrant pain with splenic
varices

EXAM:
CT ABDOMEN AND PELVIS WITH CONTRAST
TECHNIQUE: Multidetector CT imaging of the abdomen and pelvis was performed
using the standard protocol following bolus administration of
intravenous contrast.
CONTRAST:  100mL OMNIPAQUE IOHEXOL 300 MG/ML  SOLN

[Series 2: axial st · axial · 0.91mm/px · z∈[+971,+1351]mm · 11 of 88 slices shown, 13 images]
[im 6/88  soft-tissue]
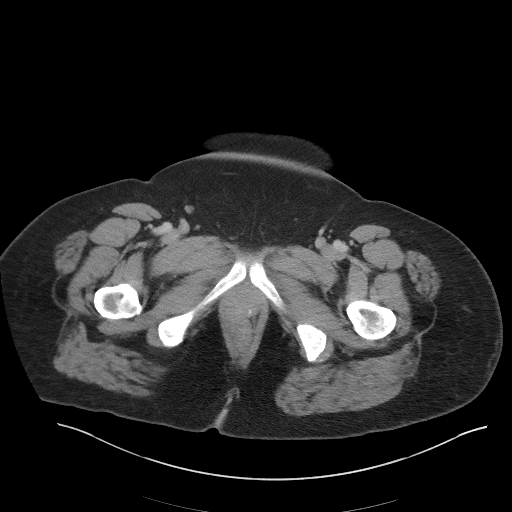
[im 6/88  bone]
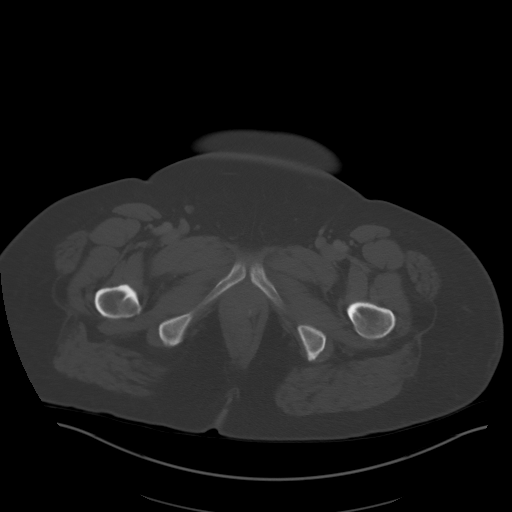
[im 17/88  soft-tissue]
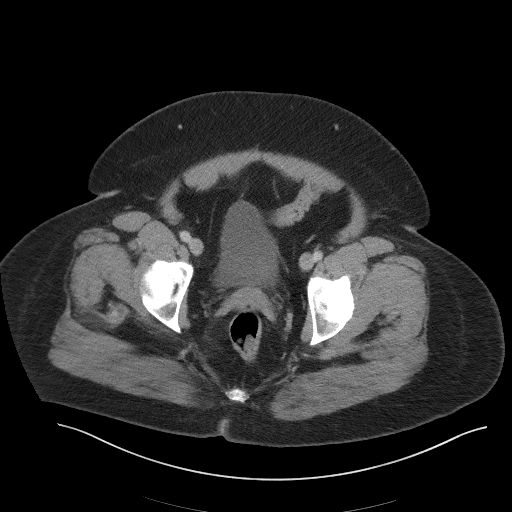
[im 22/88  soft-tissue]
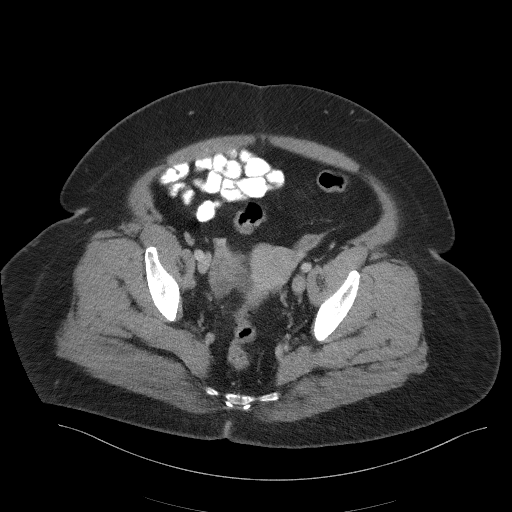
[im 28/88  soft-tissue]
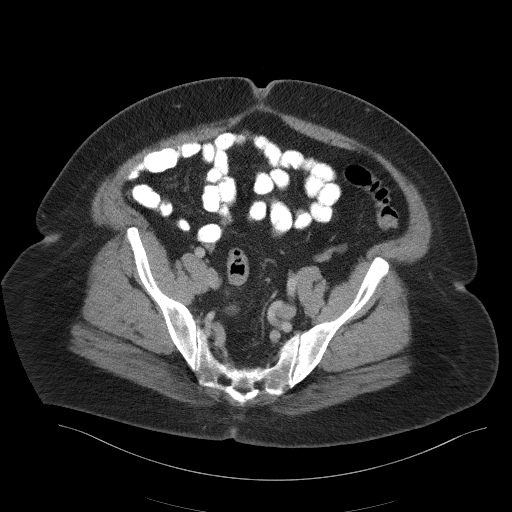
[im 39/88  soft-tissue]
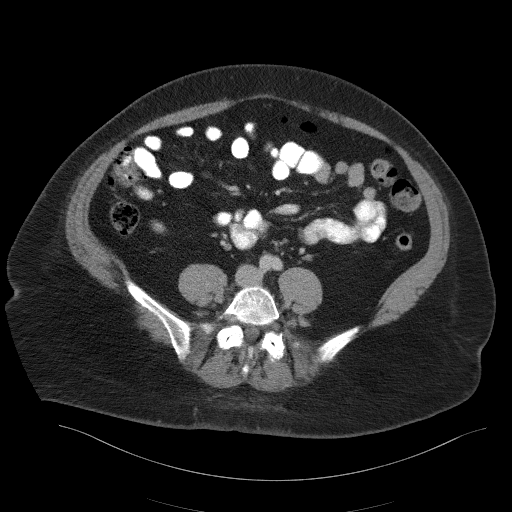
[im 44/88  soft-tissue]
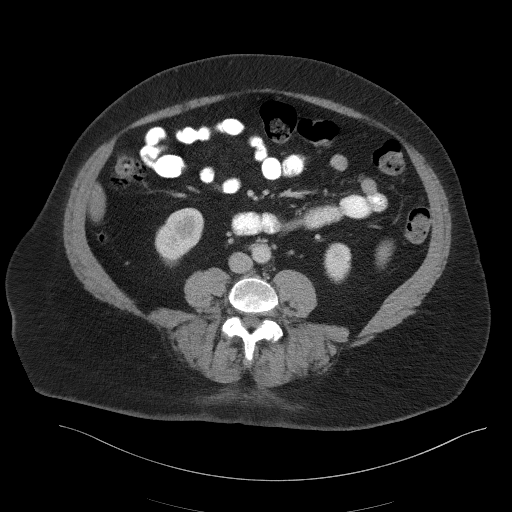
[im 49/88  soft-tissue]
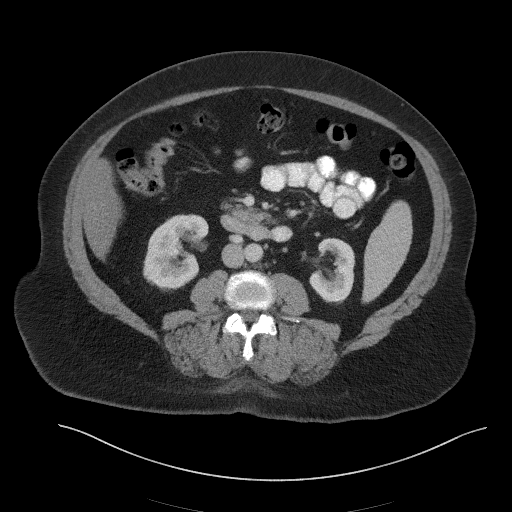
[im 60/88  soft-tissue]
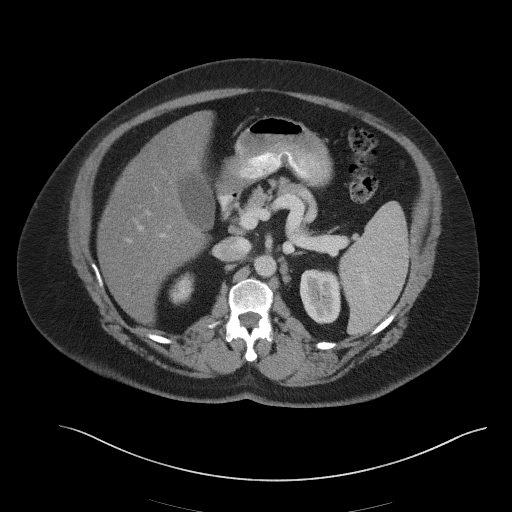
[im 66/88  soft-tissue]
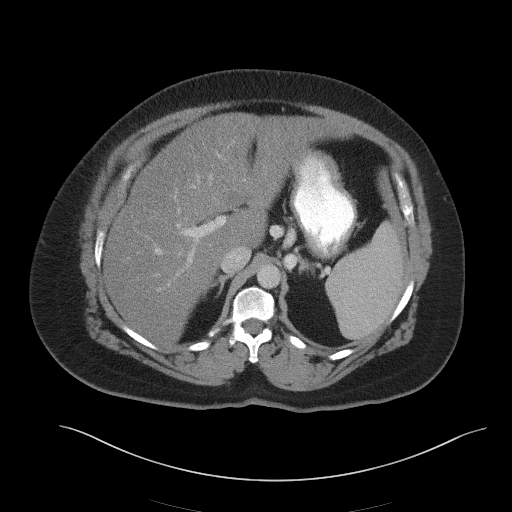
[im 66/88  bone]
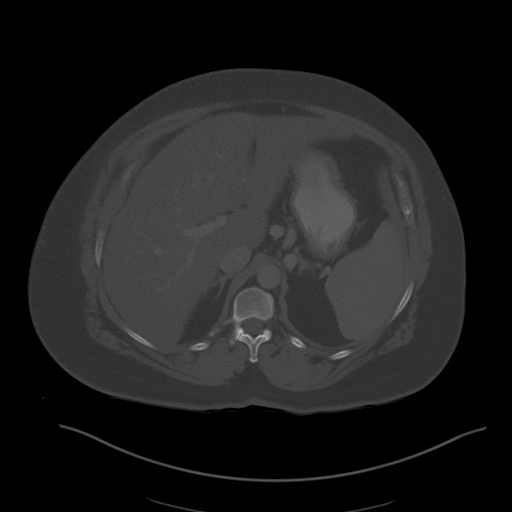
[im 71/88  soft-tissue]
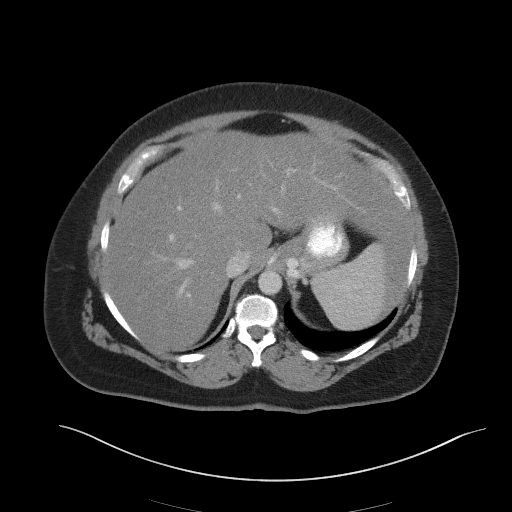
[im 82/88  soft-tissue]
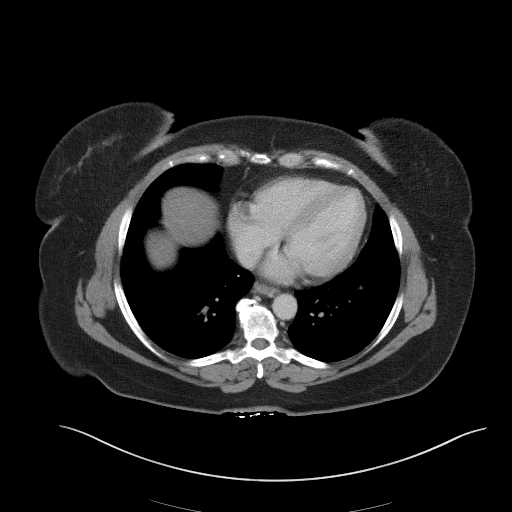

[Series 5: coronal st · coronal · 0.85mm/px · 3 of 123 slices shown]
[im 41/123  soft-tissue]
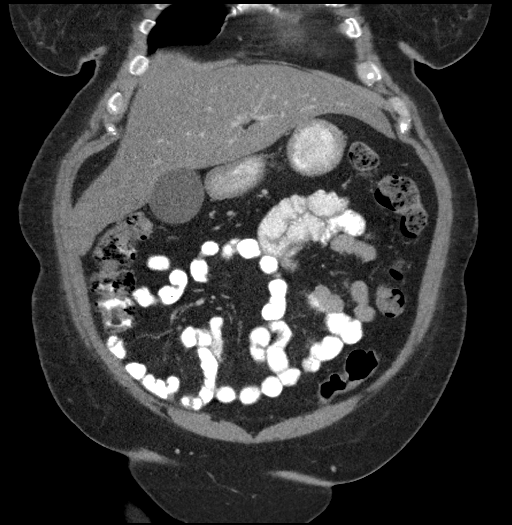
[im 55/123  soft-tissue]
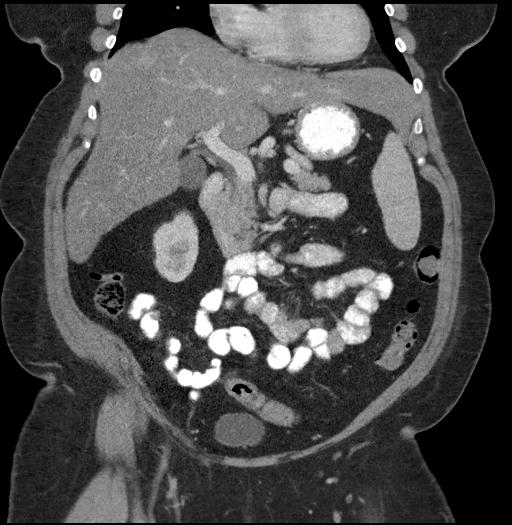
[im 68/123  soft-tissue]
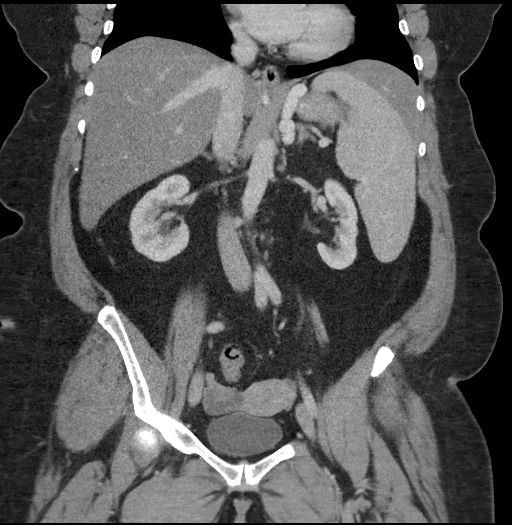

[14 of 46 positions shown; findings below may reference images not displayed]

FINDINGS: Lower chest: Pleural and parenchymal scarring at the RIGHT lung base
in the setting of exuberant osteophytes along the RIGHT of the
spine. No effusion. No consolidation.

Hepatobiliary: Hepatic Aileen with hepatic steatosis liver measures
approximately 18 cm greatest craniocaudal dimension extending across
the midline. No suspicious focal hepatic lesion. No pericholecystic
stranding. No biliary duct dilation.

Pancreas: No peripancreatic stranding or ductal dilation.

Spleen: Spleen is enlarged at 16-17 cm greatest craniocaudal extent,
quite similar to its size in 0551. Evidence of perigastric varices
likely with esophageal varices as before.

Adrenals/Urinary Tract: Adrenal glands are normal.

No hydronephrosis. The urinary bladder is normal. Symmetric renal
enhancement.

Stomach/Bowel: No acute gastrointestinal process. Appendix is
normal. Colon is largely stool filled without pericolonic stranding.

Vascular/Lymphatic: Vascular structures in the abdomen are patent.
No aneurysmal dilation or atherosclerotic changes. No adenopathy in
the upper abdomen or in the retroperitoneum.

No pelvic lymphadenopathy.

Reproductive: Fullness of both the LEFT and RIGHT adnexa, of
uncertain significance in this patient who is likely postmenopausal
more so on the RIGHT than the LEFT. There was fullness in the adnexa
before. No stranding about the adnexa or uterus. No free fluid in
the pelvis.

Other: No ascites.  No free air.

Musculoskeletal: No acute bone finding. No destructive bone process.
IMPRESSION: 1. Hepatosplenomegaly with hepatic steatosis. Findings suggest liver
disease and portal hypertension and are similar to the previous
imaging study of 0551.
2. Evidence of perigastric varices as before, potentially associated
with esophageal varices currently.
3. Fullness of both the LEFT and RIGHT adnexa, of uncertain
significance in this patient who is likely postmenopausal more so on
the RIGHT than the LEFT. There was fullness in the adnexa before.
Findings could reflect changes of PCOS given that fullness and
cystic changes in the ovaries have been present since 0551.
Follow-up pelvic ultrasound may be helpful in this postmenopausal
patient. Correlate with any pelvic symptoms.
4. Aortic atherosclerosis.

Aortic Atherosclerosis (IKPTP-C1Q.Q).

ADDENDUM:
There is some mild gallbladder distension which is nonspecific and
seen without pericholecystic stranding. Findings of potential liver
disease and ovarian findings were discussed with the provider as
outlined.

These results were called by telephone at the time of interpretation
on 11/18/2019 at [DATE] to provider CAIXINHA RABEAU , who verbally
acknowledged these results.

*** End of Addendum ***
FINDINGS: Lower chest: Pleural and parenchymal scarring at the RIGHT lung base
in the setting of exuberant osteophytes along the RIGHT of the
spine. No effusion. No consolidation.

Hepatobiliary: Hepatic Aileen with hepatic steatosis liver measures
approximately 18 cm greatest craniocaudal dimension extending across
the midline. No suspicious focal hepatic lesion. No pericholecystic
stranding. No biliary duct dilation.

Pancreas: No peripancreatic stranding or ductal dilation.

Spleen: Spleen is enlarged at 16-17 cm greatest craniocaudal extent,
quite similar to its size in 0551. Evidence of perigastric varices
likely with esophageal varices as before.

Adrenals/Urinary Tract: Adrenal glands are normal.

No hydronephrosis. The urinary bladder is normal. Symmetric renal
enhancement.

Stomach/Bowel: No acute gastrointestinal process. Appendix is
normal. Colon is largely stool filled without pericolonic stranding.

Vascular/Lymphatic: Vascular structures in the abdomen are patent.
No aneurysmal dilation or atherosclerotic changes. No adenopathy in
the upper abdomen or in the retroperitoneum.

No pelvic lymphadenopathy.

Reproductive: Fullness of both the LEFT and RIGHT adnexa, of
uncertain significance in this patient who is likely postmenopausal
more so on the RIGHT than the LEFT. There was fullness in the adnexa
before. No stranding about the adnexa or uterus. No free fluid in
the pelvis.

Other: No ascites.  No free air.

Musculoskeletal: No acute bone finding. No destructive bone process.
IMPRESSION: 1. Hepatosplenomegaly with hepatic steatosis. Findings suggest liver
disease and portal hypertension and are similar to the previous
imaging study of 0551.
2. Evidence of perigastric varices as before, potentially associated
with esophageal varices currently.
3. Fullness of both the LEFT and RIGHT adnexa, of uncertain
significance in this patient who is likely postmenopausal more so on
the RIGHT than the LEFT. There was fullness in the adnexa before.
Findings could reflect changes of PCOS given that fullness and
cystic changes in the ovaries have been present since 0551.
Follow-up pelvic ultrasound may be helpful in this postmenopausal
patient. Correlate with any pelvic symptoms.
4. Aortic atherosclerosis.

Aortic Atherosclerosis (IKPTP-C1Q.Q).

## 2021-11-01 ENCOUNTER — Ambulatory Visit: Admit: 2021-11-01 | Payer: PRIVATE HEALTH INSURANCE | Attending: Internal Medicine | Primary: Internal Medicine

## 2021-11-16 ENCOUNTER — Ambulatory Visit: Admit: 2021-11-16 | Discharge: 2021-11-17 | Payer: PRIVATE HEALTH INSURANCE

## 2021-11-16 DIAGNOSIS — R7303 Prediabetes: Principal | ICD-10-CM

## 2021-11-16 DIAGNOSIS — R109 Unspecified abdominal pain: Principal | ICD-10-CM

## 2021-11-16 DIAGNOSIS — Z96651 Presence of right artificial knee joint: Principal | ICD-10-CM

## 2021-11-16 DIAGNOSIS — G8929 Other chronic pain: Principal | ICD-10-CM

## 2021-11-16 DIAGNOSIS — M25561 Pain in right knee: Principal | ICD-10-CM

## 2021-11-16 DIAGNOSIS — R21 Rash and other nonspecific skin eruption: Principal | ICD-10-CM

## 2021-11-16 DIAGNOSIS — M25562 Pain in left knee: Principal | ICD-10-CM

## 2021-11-16 MED ORDER — TRIAMCINOLONE ACETONIDE 0.1 % TOPICAL OINTMENT
Freq: Every day | TOPICAL | 0 refills | 0.00000 days | Status: CP
Start: 2021-11-16 — End: 2022-11-16

## 2021-11-28 ENCOUNTER — Ambulatory Visit
Admit: 2021-11-28 | Discharge: 2021-11-29 | Payer: PRIVATE HEALTH INSURANCE | Attending: Internal Medicine | Primary: Internal Medicine

## 2021-11-28 DIAGNOSIS — K7460 Unspecified cirrhosis of liver: Principal | ICD-10-CM

## 2021-12-08 ENCOUNTER — Ambulatory Visit: Admit: 2021-12-08 | Discharge: 2021-12-09 | Payer: PRIVATE HEALTH INSURANCE

## 2021-12-08 DIAGNOSIS — L719 Rosacea, unspecified: Principal | ICD-10-CM

## 2021-12-08 DIAGNOSIS — R21 Rash and other nonspecific skin eruption: Principal | ICD-10-CM

## 2021-12-08 DIAGNOSIS — K7460 Unspecified cirrhosis of liver: Principal | ICD-10-CM

## 2021-12-08 MED ORDER — METRONIDAZOLE 1 % TOPICAL CREAM
Freq: Every day | TOPICAL | 2 refills | 0.00000 days | Status: CP
Start: 2021-12-08 — End: ?

## 2021-12-09 DIAGNOSIS — Z1231 Encounter for screening mammogram for malignant neoplasm of breast: Principal | ICD-10-CM

## 2021-12-09 DIAGNOSIS — L719 Rosacea, unspecified: Principal | ICD-10-CM

## 2021-12-09 MED ORDER — METRONIDAZOLE 0.75 % TOPICAL CREAM
Freq: Two times a day (BID) | TOPICAL | 10 refills | 0.00000 days | Status: CP
Start: 2021-12-09 — End: ?

## 2021-12-28 MED FILL — OZEMPIC 1 MG/DOSE (4 MG/3 ML) SUBCUTANEOUS PEN INJECTOR: SUBCUTANEOUS | 28 days supply | Qty: 3 | Fill #0

## 2021-12-28 MED FILL — OZEMPIC 2 MG/DOSE (8 MG/3 ML) SUBCUTANEOUS PEN INJECTOR: SUBCUTANEOUS | 56 days supply | Qty: 6 | Fill #0

## 2022-01-02 ENCOUNTER — Ambulatory Visit: Admit: 2022-01-02 | Discharge: 2022-01-03 | Payer: PRIVATE HEALTH INSURANCE

## 2022-01-03 MED FILL — ROSUVASTATIN 20 MG TABLET: ORAL | 90 days supply | Qty: 90 | Fill #0

## 2022-02-10 MED ORDER — OZEMPIC 2 MG/DOSE (8 MG/3 ML) SUBCUTANEOUS PEN INJECTOR
SUBCUTANEOUS | 1 refills | 28 days | Status: CP
Start: 2022-02-10 — End: 2021-11-18

## 2022-02-15 ENCOUNTER — Ambulatory Visit: Admit: 2022-02-15 | Discharge: 2022-02-16 | Payer: PRIVATE HEALTH INSURANCE

## 2022-02-15 DIAGNOSIS — Z6841 Body Mass Index (BMI) 40.0 and over, adult: Principal | ICD-10-CM

## 2022-02-15 DIAGNOSIS — E119 Type 2 diabetes mellitus without complications: Principal | ICD-10-CM

## 2022-02-17 MED ORDER — OZEMPIC 1 MG/DOSE (4 MG/3 ML) SUBCUTANEOUS PEN INJECTOR
SUBCUTANEOUS | 0 refills | 28 days | Status: CP
Start: 2022-02-17 — End: 2022-03-11

## 2022-03-07 ENCOUNTER — Ambulatory Visit: Admit: 2022-03-07 | Discharge: 2022-03-08 | Payer: PRIVATE HEALTH INSURANCE

## 2022-03-17 MED ORDER — OZEMPIC 2 MG/DOSE (8 MG/3 ML) SUBCUTANEOUS PEN INJECTOR
SUBCUTANEOUS | 1 refills | 28.00000 days | Status: CP
Start: 2022-03-17 — End: ?
  Filled 2022-03-21: qty 3, 28d supply, fill #0

## 2022-04-04 ENCOUNTER — Ambulatory Visit
Admit: 2022-04-04 | Discharge: 2022-04-05 | Payer: PRIVATE HEALTH INSURANCE | Attending: Internal Medicine | Primary: Internal Medicine

## 2022-04-04 DIAGNOSIS — K760 Fatty (change of) liver, not elsewhere classified: Principal | ICD-10-CM

## 2022-04-07 ENCOUNTER — Ambulatory Visit: Admit: 2022-04-07 | Discharge: 2022-04-08 | Payer: PRIVATE HEALTH INSURANCE

## 2022-04-07 DIAGNOSIS — Z0000 Encounter for general adult medical examination without abnormal findings: Principal | ICD-10-CM

## 2022-04-07 DIAGNOSIS — E785 Hyperlipidemia, unspecified: Principal | ICD-10-CM

## 2022-04-24 MED FILL — ROSUVASTATIN 20 MG TABLET: ORAL | 90 days supply | Qty: 90 | Fill #1

## 2022-04-24 MED FILL — OZEMPIC 2 MG/DOSE (8 MG/3 ML) SUBCUTANEOUS PEN INJECTOR: SUBCUTANEOUS | 28 days supply | Qty: 3 | Fill #1

## 2022-05-22 MED ORDER — OZEMPIC 2 MG/DOSE (8 MG/3 ML) SUBCUTANEOUS PEN INJECTOR
SUBCUTANEOUS | 4 refills | 28 days | Status: CP
Start: 2022-05-22 — End: ?
  Filled 2022-06-06: qty 3, 28d supply, fill #0

## 2022-06-28 MED FILL — OZEMPIC 2 MG/DOSE (8 MG/3 ML) SUBCUTANEOUS PEN INJECTOR: SUBCUTANEOUS | 28 days supply | Qty: 3 | Fill #1

## 2022-07-31 MED FILL — ROSUVASTATIN 20 MG TABLET: ORAL | 90 days supply | Qty: 90 | Fill #2

## 2022-07-31 MED FILL — OZEMPIC 2 MG/DOSE (8 MG/3 ML) SUBCUTANEOUS PEN INJECTOR: SUBCUTANEOUS | 28 days supply | Qty: 3 | Fill #2

## 2022-08-28 DIAGNOSIS — K7460 Unspecified cirrhosis of liver: Principal | ICD-10-CM

## 2022-08-28 MED FILL — OZEMPIC 2 MG/DOSE (8 MG/3 ML) SUBCUTANEOUS PEN INJECTOR: SUBCUTANEOUS | 28 days supply | Qty: 3 | Fill #3

## 2022-09-21 NOTE — Unmapped (Signed)
[facility] Specialty Medication Onboarding    Specialty Medication: OZEMPIC 2 mg/dose (8 mg/3 mL) Pnij (semaglutide)  Prior Authorization: Approved   Financial Assistance: No - copay  <$25  Final Copay/Day Supply: $4 / 28    Insurance Restrictions: None     Notes to Pharmacist: N/A  Credit Card on File: They have a Single-Use Credit Card on file for their current order     The triage team has completed the benefits investigation and has determined that the patient is able to fill this medication at [facility]. Please contact the patient to complete the onboarding or follow up with the prescribing physician as needed.

## 2022-09-25 MED FILL — OZEMPIC 2 MG/DOSE (8 MG/3 ML) SUBCUTANEOUS PEN INJECTOR: SUBCUTANEOUS | 28 days supply | Qty: 3 | Fill #4

## 2022-09-27 NOTE — Unmapped (Signed)
[facility] has reached out to this patient via MyChart to onboard them to our Specialty Lite services for their Ozempic. Their medication was last delivered on 09/26/22.They will now receive proactive outreach from the pharmacy team for refills.    Darryl Nestle, PharmD  [facility] Pharmacist

## 2022-10-03 ENCOUNTER — Ambulatory Visit
Admit: 2022-10-03 | Discharge: 2022-10-04 | Payer: PRIVATE HEALTH INSURANCE | Attending: Internal Medicine | Primary: Internal Medicine

## 2022-10-03 DIAGNOSIS — K760 Fatty (change of) liver, not elsewhere classified: Principal | ICD-10-CM

## 2022-10-03 DIAGNOSIS — K7460 Unspecified cirrhosis of liver: Principal | ICD-10-CM

## 2022-10-03 LAB — COMPREHENSIVE METABOLIC PANEL
ALBUMIN: 3.9 g/dL (ref 3.4–5.0)
ALKALINE PHOSPHATASE: 61 U/L (ref 46–116)
ALT (SGPT): 28 U/L (ref 10–49)
ANION GAP: 3 mmol/L — ABNORMAL LOW (ref 5–14)
AST (SGOT): 27 U/L (ref <=34)
BILIRUBIN TOTAL: 1 mg/dL (ref 0.3–1.2)
BLOOD UREA NITROGEN: 17 mg/dL (ref 9–23)
BUN / CREAT RATIO: 17
CALCIUM: 9.5 mg/dL (ref 8.7–10.4)
CHLORIDE: 109 mmol/L — ABNORMAL HIGH (ref 98–107)
CO2: 30 mmol/L (ref 20.0–31.0)
CREATININE: 1.02 mg/dL
EGFR CKD-EPI (2021) FEMALE: 62 mL/min/{1.73_m2} (ref >=60)
GLUCOSE RANDOM: 93 mg/dL (ref 70–179)
POTASSIUM: 4.5 mmol/L (ref 3.5–5.1)
PROTEIN TOTAL: 6.4 g/dL (ref 5.7–8.2)
SODIUM: 142 mmol/L (ref 135–145)

## 2022-10-03 LAB — CBC
HEMATOCRIT: 38.6 % (ref 34.0–44.0)
HEMOGLOBIN: 13.4 g/dL (ref 11.3–14.9)
MEAN CORPUSCULAR HEMOGLOBIN CONC: 34.7 g/dL (ref 32.0–36.0)
MEAN CORPUSCULAR HEMOGLOBIN: 31 pg (ref 25.9–32.4)
MEAN CORPUSCULAR VOLUME: 89.4 fL (ref 77.6–95.7)
MEAN PLATELET VOLUME: 8.1 fL (ref 6.8–10.7)
PLATELET COUNT: 160 10*9/L (ref 150–450)
RED BLOOD CELL COUNT: 4.32 10*12/L (ref 3.95–5.13)
RED CELL DISTRIBUTION WIDTH: 13.1 % (ref 12.2–15.2)
WBC ADJUSTED: 6.9 10*9/L (ref 3.6–11.2)

## 2022-10-03 LAB — PROTIME-INR
INR: 1.05
PROTIME: 11.7 s (ref 9.9–12.6)

## 2022-10-03 LAB — AFP TUMOR MARKER: AFP-TUMOR MARKER: 4 ng/mL (ref <=8)

## 2022-10-03 NOTE — Unmapped (Signed)
[facility], [place]        Referring Provider:  Bertram Savin, MD  [address]  [address]  [place],  [place] [address]     Primary Care Provider:  Catalina Lunger, DO    Other Specialist(s):         PATIENT PROFILE:        Stacy Fox is a 62 y.o. 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  Nodular liver seen at [facility] which she tolerated without decompensation.  MetS risk factors.  Low MELD.    Doing well overal. Back on GLP1RA, lost 25lbs total, 4 since last visit.    Predicted Postoperative Outcomes by the VOCAL-Penn Score (for major orthopedic surgery):    Risk for knee replacement likely lower.  30-day mortality: 0.9%      90-day mortality: 3.0%      180-day mortality: 3.6%      90-day decompensation: 4.0%    MELD 3.0: 8 at 10/03/2022 10:43 AM  MELD-Na: 7 at 10/03/2022 10:43 AM  Calculated from:  Serum Creatinine: 1.02 mg/dL at 08/15/5407 81:19 AM  Serum Sodium: 142 mmol/L (Using max of 137 mmol/L) at 10/03/2022 10:43 AM  Total Bilirubin: 1.0 mg/dL at 1/47/8295 62:13 AM  Serum Albumin: 3.9 g/dL (Using max of 3.5 g/dL) at 0/86/5784 69:62 AM  INR(ratio): 1.05 at 10/03/2022 10:43 AM  Age at listing (hypothetical): 62 years  Sex: Female at 10/03/2022 10:43 AM              PLAN:         -EGD- no varices, repeat 2025  -low Na diet, increase protein  -continue GLP1RA  -low risk for knee replacement if/when needed.  -[place] scheduled for next week, 6 months ordered.  AFP today  -RTC 6 months        CHIEF COMPLAINT: cirrhosis    HISTORY OF PRESENT ILLNESS: This is a 62 y.o. year old female with a nodular liver seen during [facility].  She has been previously told she had a fatty liver  No ETOH use.  BMI 44, HTN, Lipids, DM.  No over liver symptoms or decompensatin events.  No jaundice, encephalopathy, ascites or GI bleeding.  Has been told she has varices on imaging but apparently non on upper endoscopy done several years ago.  Was able to lose 50lbs on phentermine, but gained it all back during pandemic.  Has also been on ozempic in the past.     Interval history  Briefly tried GLP but stopped due to possible drug rash.  Not clear as she has tolerated this medication previously.  No jaundice, encephalopathy, ascites or GI bleeding.    Interval history  Now back on GLP1RA.  No rash.  Titrated to 2mg .  Lost 20lbs so far.    Interval History  No liver events.  Lost 4 lbs since last visit.  Tolerating GLP1RA.  No jaundice, encephalopathy, ascites or GI bleeding.      REVIEW OF SYSTEMS:     The balance of 12 systems reviewed is negative except as noted in the HPI.     PAST MEDICAL HISTORY:    Past Medical History:   Diagnosis Date    Acute post-traumatic headache     Breast cyst     Cervical spondylosis     Cervicalgia  Chronic pain syndrome     Colon polyp     Degeneration of cervical intervertebral disc     Diabetes mellitus (CMS-HCC)     Disturbance of skin sensation     Fatty liver     GERD (gastroesophageal reflux disease)     Hyperlipidemia     Legally blind     Morbid obesity with BMI of 40.0-44.9, adult (CMS-HCC)     Osteoarthritis     Pain in joint, shoulder region     Pain in thoracic spine     Pneumonia     Rectal bleeding     Sleep apnea     uses cpap    Sprain of neck     Varices of spleen        PAST SURGICAL HISTORY:    Past Surgical History:   Procedure Laterality Date    BREAST BIOPSY Left     1990's- benign    BREAST LUMPECTOMY Left     Left breast 1990's- Benign cyst removed per patient    CARPAL TUNNEL RELEASE Bilateral     CHOLECYSTECTOMY      03/18/2021    COLONOSCOPY      x 2 (patient thinks 1 polyp removed)    CYSTOSCOPY W/ URETERAL STENT PLACEMENT      kidney stone removed    ELBOW SURGERY Right     nerve release    ESOPHAGOGASTRODUODENOSCOPY      x2    EYE SURGERY Left     tighten left eye muscle    FINGER SURGERY Bilateral     trigger finger and both thumbs    FOOT SURGERY Left     bone shaved    GANGLION CYST EXCISION Left     left wrist (multiple times removed)    HAND SURGERY Right     laceration repair to right hand    HERNIA REPAIR Midline 03/18/2021    JOINT REPLACEMENT Right 2018    right knee replacement    PR COLONOSCOPY FLX DX W/COLLJ SPEC WHEN PFRMD Left 12/30/2014    Procedure: COLONOSCOPY, FLEXIBLE, PROXIMAL TO SPLENIC FLEXURE; DIAGNOSTIC, W/WO COLLECTION SPECIMEN BY BRUSH OR WASH;  Surgeon: Harvest Dark, MD;  Location: Endo Procedures [facility];  Service: General Surgery    PR COLONOSCOPY FLX DX W/COLLJ SPEC WHEN PFRMD N/A 10/21/2020    Procedure: COLONOSCOPY, FLEXIBLE, PROXIMAL TO SPLENIC FLEXURE; DIAGNOSTIC, W/WO COLLECTION SPECIMEN BY BRUSH OR WASH;  Surgeon: Alice Reichert, MD;  Location: ENDO OR [facility];  Service: General Surgery    PR KNEE SCOPE,MED/LAT MENISECTOMY Right 09/02/2014    Procedure: ARTHROSCOPY, KNEE; W/MENISECT(MED/LAT, INCL MENISCAL SHAVE) W/DEBRIDE/SHAVE ARTICULAR CART(CHONDROPLASTY);  Surgeon: Verlin Dike, MD;  Location: [facility];  Service: Orthopedics    PR LAP,CHOLECYSTECTOMY/GRAPH N/A 03/18/2021    Procedure: LAPAROSCOPIC CHOLECYSTECTOMY POSS CHOLANGIOGRAPHY;  Surgeon: Martie Round, MD;  Location: OR [facility];  Service: General Surgery    PR UPPER GI ENDOSCOPY,DIAGNOSIS N/A 05/26/2021    Procedure: UGI ENDO, INCLUDE ESOPHAGUS, STOMACH, & DUODENUM &/OR JEJUNUM; DX W/WO COLLECTION SPECIMN, BY BRUSH OR WASH;  Surgeon: Janyth Pupa, MD;  Location: [facility];  Service: Gastroenterology    ROTATOR CUFF REPAIR      left - 2013 and right - 2015       MEDICATIONS:      Current Outpatient Medications:     blood sugar diagnostic (ON CALL EXPRESS TEST STRIP) Strp, Use to check blood sugar as directed with insulin  3 times a day and for symptoms of high or low blood sugar., Disp: 100 each, Rfl: 6    blood-glucose meter kit, Use to check blood sugar as directed with insulin 3 times a day & for symptoms of high or low blood sugar., Disp: 1 each, Rfl: 0 lancets (ON CALL LANCET) 30 gauge Misc, Use to check blood sugar as directed with insulin 3 times a day & for symptoms of high or low blood sugar., Disp: 100 each, Rfl: 5    lancing device Misc, Use as directed, Disp: 1 each, Rfl: 0    rosuvastatin (CRESTOR) 20 MG tablet, Take 1 tablet (20 mg total) by mouth daily., Disp: 90 tablet, Rfl: 3    semaglutide (OZEMPIC) 2 mg/dose (8 mg/3 mL) PnIj, Inject 2 mg under the skin every seven (7) days., Disp: 3 mL, Rfl: 4    aspirin 81 MG chewable tablet, Chew 1 tablet (81 mg total) daily., Disp: 30 tablet, Rfl: 6    triamcinolone (KENALOG) 0.1 % ointment, Apply topically daily., Disp: 30 g, Rfl: 0    ALLERGIES:    Ranitidine    SOCIAL HISTORY:    Social History     Socioeconomic History    Marital status: Married     Spouse name: None    Number of children: None    Years of education: None    Highest education level: None   Tobacco Use    Smoking status: Never     Passive exposure: Never    Smokeless tobacco: Never   Vaping Use    Vaping status: Never Used   Substance and Sexual Activity    Alcohol use: Not Currently    Drug use: No    Sexual activity: Not Currently     Partners: Male   Other Topics Concern    Do you use sunscreen? No    Tanning bed use? No    Are you easily burned? No    Excessive sun exposure? No    Blistering sunburns? No     Social Determinants of Health     Financial Resource Strain: Low Risk  (04/07/2022)    Overall Financial Resource Strain (CARDIA)     Difficulty of Paying Living Expenses: Not hard at all   Food Insecurity: No Food Insecurity (04/07/2022)    Hunger Vital Sign     Worried About Running Out of Food in the Last Year: Never true     Ran Out of Food in the Last Year: Never true   Transportation Needs: No Transportation Needs (04/07/2022)    PRAPARE - Therapist, art (Medical): No     Lack of Transportation (Non-Medical): No   Physical Activity: Insufficiently Active (09/16/2020)    Exercise Vital Sign     Days of Exercise per Week: 4 days     Minutes of Exercise per Session: 10 min   Stress: No Stress Concern Present (04/07/2022)    [employer] of Occupational Health - Occupational Stress Questionnaire     Feeling of Stress : Not at all   Social Connections: Socially Integrated (04/07/2022)    Social Connection and Isolation Panel [NHANES]     Frequency of Communication with Friends and Family: Twice a week     Frequency of Social Gatherings with Friends and Family: Twice a week     Attends Religious Services: 1 to 4 times per year     Active Member of Clubs or Organizations: No  Attends Banker Meetings: 1 to 4 times per year     Marital Status: Married       FAMILY HISTORY:    family history includes Cancer (age of onset: 42) in her sister; Cancer (age of onset: 72) in her sister; Emphysema in her father; Heart disease in her mother; Leukemia in her sister; Ovarian cancer in her sister.      VITAL SIGNS:    BP 111/67  - Pulse 70  - Temp 36.5 ??C (97.7 ??F) (Tympanic)  - Ht 149.9 cm (4' 11.02)  - Wt 89.3 kg (196 lb 14.4 oz)  - LMP  (LMP Unknown)  - SpO2 96%  - BMI 39.75 kg/m??   Body mass index is 39.75 kg/m??.    PHYSICAL EXAM:    Normal comprehensive exam:      Constitutional:   Alert, oriented x 3, no acute distress, well nourished   Mental Status:   Thought organized, appropriate affect, normal fluent speech.   HEENT:   PEERL, conjunctiva clear, anicteric, oropharynx clear, neck supple, no LAD.   Respiratory: Clear to auscultation, and percussion to the bases, unlabored breathing.     Cardiac: Regular rate and rhythm normal S1 and S2, no murmur.      Abdomen: Soft, non-distended, non-tender, no organomegaly or masses.     Perianal/Rectal Exam Not performed.     Extremities:   No edema, well perfused.   Musculoskeletal: No joint swelling or tenderness noted, no deformities.     Skin: Pustular rash on face, mild     Neuro: No focal deficits.          DIAGNOSTIC STUDIES:  I have reviewed all pertinent diagnostic studies, including:    GI Procedures:    No varices on past EGD per pateint  Radiographic studies:    reviewed  Laboratory results:  Lab Results   Component Value Date    WBC 6.9 10/03/2022    HGB 13.4 10/03/2022    HCT 38.6 10/03/2022    PLT 160 10/03/2022       Lab Results   Component Value Date    NA 142 10/03/2022    K 4.5 10/03/2022    CL 109 (H) 10/03/2022    CO2 30.0 10/03/2022    BUN 17 10/03/2022    CREATININE 1.02 10/03/2022    GLU 93 10/03/2022    CALCIUM 9.5 10/03/2022       Lab Results   Component Value Date    BILITOT 1.0 10/03/2022    BILIDIR 0.14 03/16/2021    PROT 6.4 10/03/2022    ALBUMIN 3.9 10/03/2022    ALT 28 10/03/2022    AST 27 10/03/2022    ALKPHOS 61 10/03/2022       Lab Results   Component Value Date    PT 11.7 10/03/2022    INR 1.05 10/03/2022    APTT 23.0 04/05/2016

## 2022-10-11 ENCOUNTER — Ambulatory Visit: Admit: 2022-10-11 | Discharge: 2022-10-11 | Payer: PRIVATE HEALTH INSURANCE

## 2022-10-19 MED ORDER — OZEMPIC 2 MG/DOSE (8 MG/3 ML) SUBCUTANEOUS PEN INJECTOR
SUBCUTANEOUS | 4 refills | 28 days
Start: 2022-10-19 — End: ?

## 2022-10-23 MED ORDER — OZEMPIC 2 MG/DOSE (8 MG/3 ML) SUBCUTANEOUS PEN INJECTOR
SUBCUTANEOUS | 4 refills | 28 days | Status: CP
Start: 2022-10-23 — End: ?
  Filled 2022-10-24: qty 3, 28d supply, fill #0

## 2022-10-23 NOTE — Unmapped (Signed)
Stacy Fox requested a refill of their Ozempic via IVR/Web. The [facility] has scheduled delivery per the patients request via UPS to be delivered to their prescription address on 10/25/22.

## 2022-11-16 DIAGNOSIS — Z760 Encounter for issue of repeat prescription: Principal | ICD-10-CM

## 2022-11-16 DIAGNOSIS — E785 Hyperlipidemia, unspecified: Principal | ICD-10-CM

## 2022-11-16 MED ORDER — ROSUVASTATIN 20 MG TABLET
ORAL_TABLET | Freq: Every day | ORAL | 1 refills | 90 days | Status: CP
Start: 2022-11-16 — End: ?
  Filled 2022-11-21: qty 90, 90d supply, fill #0

## 2022-11-17 NOTE — Unmapped (Signed)
Stacy Fox requested a refill of their Ozempic via IVR/Web. The [facility] has scheduled delivery per the patients request via UPS to be delivered to their prescription address on 11/20/22.

## 2022-11-21 MED FILL — OZEMPIC 2 MG/DOSE (8 MG/3 ML) SUBCUTANEOUS PEN INJECTOR: SUBCUTANEOUS | 28 days supply | Qty: 3 | Fill #1

## 2022-12-05 ENCOUNTER — Ambulatory Visit: Admit: 2022-12-05 | Discharge: 2022-12-06 | Payer: PRIVATE HEALTH INSURANCE

## 2022-12-05 DIAGNOSIS — D2330 Other benign neoplasm of skin of unspecified part of face: Principal | ICD-10-CM

## 2022-12-05 DIAGNOSIS — Z1231 Encounter for screening mammogram for malignant neoplasm of breast: Principal | ICD-10-CM

## 2022-12-05 DIAGNOSIS — Z23 Encounter for immunization: Principal | ICD-10-CM

## 2022-12-05 DIAGNOSIS — L819 Disorder of pigmentation, unspecified: Principal | ICD-10-CM

## 2022-12-05 DIAGNOSIS — E119 Type 2 diabetes mellitus without complications: Principal | ICD-10-CM

## 2022-12-05 LAB — ALBUMIN / CREATININE URINE RATIO
ALBUMIN QUANT URINE: 0.4 mg/dL
ALBUMIN/CREATININE RATIO: 4 ug/mg
CREATININE, URINE: 100 mg/dL

## 2022-12-05 LAB — HEMOGLOBIN A1C
ESTIMATED AVERAGE GLUCOSE: 103 mg/dL
HEMOGLOBIN A1C: 5.2 % (ref 4.8–5.6)

## 2022-12-05 NOTE — Unmapped (Signed)
Chief concern/reason for visit: Chronic Condition Follow-Up (Patient is fasting, drank a little bit of water.no refills needed per patient. C Grant-RMA)      Assessment/Plan:     Type 2 diabetes mellitus without complication, without long-term current use of insulin (CMS-HCC)  - Hemoglobin A1c  - Albumin/creatinine urine ratio; Future  - Ambulatory referral to Ophthalmology; Future  - Albumin/creatinine urine ratio    Need for influenza vaccination  - INFLUENZA VACCINE IIV3(IM)(PF)6 [facility] UP    Encounter for screening mammogram for malignant neoplasm of breast  - Mammo Digital Screening W Tomo Bilateral W CAD; Future    Blue nevus of face    Dark circle under eye, right    Assessment:  - Stable diabetes  - Stable NAFLD  - Sleep disturbance  - Arthritis in hands  - Constipation  - Possible dry eye  - Tonsil stone    Plan:  - Recheck A1C  - Urine test to check for protein (kidney function)  - Consider dermatology referral for dark circles under eyes if persistent  - Recommend over-the-counter diclofenac gel for hand arthritis pain  - Advised salt water gargle for tonsil stone  - Suggested trying chamomile tea for sleep  - Follow-up physical exam scheduled for late January or early February  - Discussed importance of diabetic eye exam, patient to follow up with recent eye doctor to confirm if retinal exam was performed  - Counseled on importance of reporting persistent chest pain or palpitations       Return in about 5 months (around 05/07/2023) for Annual physical.      Subjective     HPI:  Subjective:  - Patient presents for follow-up of diabetes and liver condition  - Reports occasional chest pain: Sometimes I'll get a twinge here and there  - Denies frequent chest pain or symptoms requiring emergency care  - Occasional heart palpitations: Sometimes, you know, like, I get a pain, and then it will you know, it could be short, and then it'll go away  - Eye concerns: I think I got cold in my eye. Because it it feels dry, and it feels weird, like, you know, when I close my eye  - Notes dark spots underneath eyelid  - Sleep issues: Lately, I don't get much sleep  - Usually sleeps 4-7 hours per night  - Uses CPAP machine  - Wakes up easily to noises  - Arthritis pain in fingers: I have arthritis bad. My finger here, it turns, and I got, like, the notches  - Bowel issues: When I poop, it's, like, hard to come out sometimes  - Reports occasional feeling of incomplete evacuation and itchiness  - Denies numbness or tingling in fingers or toes  - Denies smoking history    Past Medical History:  - Diabetes  - Non-alcoholic fatty liver disease (NAFLD)  - Sleep apnea (uses CPAP)  - Arthritis  - Vision problems (wears trifocals, Legally blind without them)    Social History:  - Lives with husband  - Avoids spicy foods  - Rarely eats out or consumes microwave meals  - Cooks from scratch, includes vegetables and fruits in diet  - Occasional soda consumption when dining out  - 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     Physical Exam:  Physical Exam  Vitals reviewed.   Constitutional:       Appearance: She is obese.   HENT:      Right Ear: Tympanic membrane, ear canal and external ear normal.      Left Ear: Tympanic membrane, ear canal and external ear normal.      Mouth/Throat:      Mouth: Mucous membranes are moist.      Pharynx: Posterior oropharyngeal erythema present.      Tonsils: No tonsillar exudate or tonsillar abscesses. 2+ on the right. 2+ on the left.      Comments: Tonsoliths present on the right tonsils  Eyes:      Extraocular Movements: Extraocular movements intact.      Conjunctiva/sclera: Conjunctivae normal.      Pupils: Pupils are equal, round, and reactive to light.   Cardiovascular:      Rate and Rhythm: Normal rate and regular rhythm.      Heart sounds: Normal heart sounds. No murmur heard.     No gallop.   Pulmonary:      Effort: Pulmonary effort is normal.      Breath sounds: Normal breath sounds. No stridor. No rales.   Neurological:      Mental Status: She is alert.

## 2022-12-05 NOTE — Unmapped (Signed)
Declines PCV and Shingrix vaccines. Has never had a diabetic eye exam. C Jabarri Stefanelli-RMA    Labs drawn x 1 attempt from [facility], patient tolerated well. Collected 1 urine and 1 purple tube.C Caylor Cerino-RMA

## 2022-12-07 NOTE — Unmapped (Signed)
Hepatology Clinic Pharmacist Note    Primary Hepatology provider: Dr. Ruffin Frederick  Diagnosis: MASH  Fibrosis: Cirrhosis (Nodular liver seen at [facility])    Current regimen: Ozempic 2mg  weekly since 02/20/22  Resumed Ozempic 11/28/21    Pharmacy: [facility]) Pharmacy [phone] option #4, then option 4    Reports injecting Ozempic 2mg  on Mondays on abdomen. Missed 1 week due to visiting a friend in hospital last month but back on track. Rotates sites.     Patient Reports:   Adverse effects: denies  Early satiety: Yes  Increased satiation: Yes    Other Weight-Related Considerations:   Major changes to diet? Denies but husband is seeing nutritionist at weight loss clinic  Major changes to physical activity? Increased walking ~30 min/day, thinking of getting exercise bike  Changes in energy: Denies   Major changes to sleep? Denies, falls asleep 7pm on counch, wakes up around 1am then stays up til 5am then sleeps another hour at 6am.  Major changes to mental health? Denies       Weight Management Progress:  Goal weight: 89.4kg (196.6 lbs) - initial goal met; New goal=80.5kg (177 lbs)  Initial weight: 99.3 kg (219 lb)  as of 08/03/2021  Last weight:  97.4 kg (214 lb 12.8 oz) as of 11/28/21   212 lbs per pt report on 12/23/21  94.1kg (207 lb 8oz) on 02/15/22 at appt with Dr. Allena Katz  90.7 kg (200 lb)  as of 04/04/2022 at appt with Dr. Allena Katz  197 lbs per pt report on 05/22/22  89.3 kg (196 lb 14.4 oz) 10/03/22  Current weight:  87.7 kg (193 lb 4.8 oz)  12/05/22  Percentage weight loss: 11.7% (11.6 kg, 25.5 lbs)   Since initial visit:     Pt reports she's tolerating 2mg  with no issues. She is continuing to see weight loss benefit on Ozempic. Will continue current dose. Recommended to follow up with GI Nutritionist again. Discussed the importance of medication adherence, lifestyle modifications including good sleep hygiene, and consistency to meet weight management goals.      Reviewed to continue to monitor blood sugar.  Reviewed signs/symptoms of hypoglycemia and management strategies in case of hypoglycemia. Reminded pt of her follow up appt scheduled with Dr. Ruffin Frederick on 1/21//25. Pt verbalized understanding.  Will continue to follow 3-6 months.    Park Breed, Pharm D., BCPS, BCGP, CPP  [facility]  [address]  [place], [place] [address]  [phone]

## 2022-12-25 MED FILL — OZEMPIC 2 MG/DOSE (8 MG/3 ML) SUBCUTANEOUS PEN INJECTOR: SUBCUTANEOUS | 28 days supply | Qty: 3 | Fill #2

## 2023-01-04 ENCOUNTER — Ambulatory Visit: Admit: 2023-01-04 | Discharge: 2023-01-05 | Payer: PRIVATE HEALTH INSURANCE

## 2023-01-16 MED FILL — OZEMPIC 2 MG/DOSE (8 MG/3 ML) SUBCUTANEOUS PEN INJECTOR: SUBCUTANEOUS | 28 days supply | Qty: 3 | Fill #3

## 2023-01-16 NOTE — Unmapped (Signed)
Versie Starks requested a refill of their Ozempic via IVR/Web. [facility] has scheduled delivery per the patients request via UPS to be delivered to their prescription address on 01/17/23.

## 2023-02-13 ENCOUNTER — Ambulatory Visit: Admit: 2023-02-13 | Discharge: 2023-02-13 | Payer: PRIVATE HEALTH INSURANCE

## 2023-02-13 DIAGNOSIS — R0981 Nasal congestion: Principal | ICD-10-CM

## 2023-02-13 DIAGNOSIS — R059 Cough, unspecified type: Principal | ICD-10-CM

## 2023-02-13 DIAGNOSIS — J069 Acute upper respiratory infection, unspecified: Principal | ICD-10-CM

## 2023-02-13 MED ORDER — IPRATROPIUM BROMIDE 21 MCG (0.03 %) NASAL SPRAY
Freq: Three times a day (TID) | NASAL | 0 refills | 29 days | Status: CP
Start: 2023-02-13 — End: 2024-02-13

## 2023-02-13 MED ORDER — PREDNISONE 20 MG TABLET
ORAL_TABLET | Freq: Every day | ORAL | 0 refills | 30 days | Status: CP
Start: 2023-02-13 — End: ?

## 2023-02-13 NOTE — Unmapped (Signed)
Chief concern/reason for visit: Acute Illness (Cough,nasal congestion, chest hurt when cough, coughing up brown mucus, both ears hurt-started about 3 days ago)     Assessment/Plan:     Cough, unspecified type  - RAPID INFLUENZA/RSV/COVID PCR    Nasal congestion  - RAPID INFLUENZA/RSV/COVID PCR  - ipratropium (ATROVENT) 21 mcg (0.03 %) nasal spray; 2 sprays into each nostril Three (3) times a day.    Upper respiratory tract infection, unspecified type  - predniSONE (DELTASONE) 20 MG tablet; Take 1 tablet (20 mg total) by mouth daily.    Assessment & Plan:  1. Upper Respiratory Infection  - Treatment planned: Ipratropium nasal spray prescribed, 2 sprays each nostril three times daily  - Follow up planned: Return visit scheduled for week after Christmas for surgery clearance paperwork    Return in about 3 months (around 05/14/2023).      Subjective     HPI:  Subjective:  - Cough productive of yellow sputum initially, progressing to brown sputum this morning  - Associated right-sided chest pressure with coughing  - Reports mild wheezing  - Initial sore throat that improved after expectorating mucus  - Recent travel to [place] by car (13-hour drive), returned last night after week-long stay  - Exposure to family members: daughter-in-law recovering from pneumonia, grandson with upper respiratory infection  - Spent time outdoors with 1-year-old granddaughter doing chalk activities on concrete  - Denies fever, chills, nausea, vomiting  - Denies body aches  - Sleeping well  - Denies leg pain or swelling  - Denies tooth pain except in lower teeth  - Denies exposure to motels or other accommodations, stayed at [employer]    Past Medical History:  - History of sinus infection several years ago    Objective     BP 107/74 (BP Site: L Arm, BP Position: Sitting, BP Cuff Size: Large)  - Pulse 72  - Temp 36.9 ??C (98.5 ??F) (Temporal)  - Resp 20  - Ht 152.8 cm (5' 0.16)  - Wt 89.6 kg (197 lb 8 oz)  - LMP  (LMP Unknown)  - SpO2 96%  - BMI 38.37 kg/m??     Physical Exam:  Physical Exam  Vitals reviewed.   Constitutional:       Appearance: She is obese.   HENT:      Right Ear: Ear canal and external ear normal. Tympanic membrane is bulging.      Left Ear: Ear canal and external ear normal. Tympanic membrane is bulging.      Nose: Mucosal edema and congestion present.      Left Turbinates: Enlarged.      Right Sinus: No maxillary sinus tenderness or frontal sinus tenderness.      Left Sinus: No maxillary sinus tenderness or frontal sinus tenderness.      Mouth/Throat:      Lips: Pink.      Mouth: Mucous membranes are moist.   Cardiovascular:      Rate and Rhythm: Normal rate and regular rhythm.      Heart sounds: Normal heart sounds. No murmur heard.     No friction rub. No gallop.   Pulmonary:      Breath sounds: Normal breath sounds. No wheezing, rhonchi or rales.   Neurological:      Mental Status: She is alert.

## 2023-02-13 NOTE — Unmapped (Signed)
Patient sent a mychart message, returned call to patient. She has had a cough for a couple of days, went out of town for the holidays. When she coughs her chest hurts, having nasal congestion, no fever, no headache this started about 3 days ago. Coughing up brown phlegm. Patient scheduled an appointment for this morning at 10:20 with Dr. Jules Husbands.

## 2023-02-16 NOTE — Unmapped (Signed)
Stacy Fox requested a refill of their Ozempic via IVR/Web. [facility] has scheduled delivery per the patients request via UPS to be delivered to their prescription address on 02/20/23.

## 2023-02-19 MED FILL — OZEMPIC 2 MG/DOSE (8 MG/3 ML) SUBCUTANEOUS PEN INJECTOR: SUBCUTANEOUS | 28 days supply | Qty: 3 | Fill #4

## 2023-02-19 MED FILL — ROSUVASTATIN 20 MG TABLET: ORAL | 90 days supply | Qty: 90 | Fill #1

## 2023-03-07 DIAGNOSIS — R0981 Nasal congestion: Principal | ICD-10-CM

## 2023-03-07 MED ORDER — IPRATROPIUM BROMIDE 21 MCG (0.03 %) NASAL SPRAY
1 refills | 0.00 days
Start: 2023-03-07 — End: ?

## 2023-03-08 MED ORDER — IPRATROPIUM BROMIDE 21 MCG (0.03 %) NASAL SPRAY
0 refills | 0.00 days | Status: CP
Start: 2023-03-08 — End: ?

## 2023-03-08 NOTE — Unmapped (Signed)
Pharmacy is requesting 90 day prescription for the following medication , please advise    Patient is requesting the following refill  Requested Prescriptions     Pending Prescriptions Disp Refills    ipratropium (ATROVENT) 21 mcg (0.03 %) nasal spray [Pharmacy Med Name: IPRATROPIUM 0.03% SPRAY]  1     Sig: SPRAY 2 SPRAYS INTO EACH NOSTRIL 3 TIMES A DAY       Recent Visits  Date Type Provider Dept   02/13/23 Office Visit Lenox Ponds, DO [facility]   12/05/22 Office Visit Jules Husbands, Melvenia Needles, DO [facility] At [facility]   04/07/22 Office Visit Catalina Lunger, DO  Family Medicine [facility] At [place]   Showing recent visits within past 365 days and meeting all other requirements  Future Appointments  No visits were found meeting these conditions.  Showing future appointments within next 365 days and meeting all other requirements       Labs: Not applicable this refill

## 2023-03-16 MED ORDER — OZEMPIC 2 MG/DOSE (8 MG/3 ML) SUBCUTANEOUS PEN INJECTOR
SUBCUTANEOUS | 4 refills | 28.00 days | Status: CP
Start: 2023-03-16 — End: ?
  Filled 2023-03-20: qty 3, 28d supply, fill #0

## 2023-03-22 DIAGNOSIS — R0981 Nasal congestion: Principal | ICD-10-CM

## 2023-03-22 MED ORDER — IPRATROPIUM BROMIDE 21 MCG (0.03 %) NASAL SPRAY
1 refills | 0.00 days | Status: CP
Start: 2023-03-22 — End: ?

## 2023-03-22 NOTE — Unmapped (Signed)
Patient is requesting the following refill  Requested Prescriptions     Pending Prescriptions Disp Refills    ipratropium (ATROVENT) 21 mcg (0.03 %) nasal spray [Pharmacy Med Name: IPRATROPIUM 0.03% SPRAY]  1     Sig: SPRAY 2 SPRAYS INTO EACH NOSTRIL 3 TIMES A DAY       Recent Visits  Date Type Provider Dept   02/13/23 Office Visit Lenox Ponds, DO [place] [facility] [address] At [facility]   12/05/22 Office Visit Jules Husbands, Melvenia Needles, DO [facility] At [facility]   04/07/22 Office Visit Catalina Lunger, DO [place] [facility] [address] At [place]   Showing recent visits within past 365 days and meeting all other requirements  Future Appointments  Date Type Provider Dept   03/26/23 Appointment Wardell Honour, Newington Kras, FNP [facility] [address] At [place]   Showing future appointments within next 365 days and meeting all other requirements       Labs: Not applicable this refill

## 2023-03-27 ENCOUNTER — Ambulatory Visit: Admit: 2023-03-27 | Discharge: 2023-03-28 | Payer: PRIVATE HEALTH INSURANCE | Attending: Family | Primary: Family

## 2023-03-27 DIAGNOSIS — Z01818 Encounter for other preprocedural examination: Principal | ICD-10-CM

## 2023-03-27 DIAGNOSIS — E785 Hyperlipidemia, unspecified: Principal | ICD-10-CM

## 2023-03-27 DIAGNOSIS — E66812 Class 2 severe obesity due to excess calories with serious comorbidity and body mass index (BMI) of 38.0 to 38.9 in adult (CMS-HCC): Principal | ICD-10-CM

## 2023-03-27 DIAGNOSIS — K760 Fatty (change of) liver, not elsewhere classified: Principal | ICD-10-CM

## 2023-03-27 DIAGNOSIS — R8290 Unspecified abnormal findings in urine: Principal | ICD-10-CM

## 2023-03-27 DIAGNOSIS — E1169 Type 2 diabetes mellitus with other specified complication: Principal | ICD-10-CM

## 2023-03-27 DIAGNOSIS — R300 Dysuria: Principal | ICD-10-CM

## 2023-03-27 DIAGNOSIS — Z6838 Body mass index (BMI) 38.0-38.9, adult: Principal | ICD-10-CM

## 2023-03-27 DIAGNOSIS — E119 Type 2 diabetes mellitus without complications: Principal | ICD-10-CM

## 2023-03-27 DIAGNOSIS — M1712 Unilateral primary osteoarthritis, left knee: Principal | ICD-10-CM

## 2023-03-27 LAB — COMPREHENSIVE METABOLIC PANEL
ALBUMIN: 3.8 g/dL (ref 3.5–5.0)
ALKALINE PHOSPHATASE: 55 U/L (ref 46–116)
ALT (SGPT): 35 U/L (ref 12–78)
ANION GAP: 4 mmol/L (ref 3–11)
AST (SGOT): 21 U/L (ref 15–40)
BILIRUBIN TOTAL: 1.1 mg/dL (ref 0.3–1.2)
BLOOD UREA NITROGEN: 13 mg/dL (ref 8–20)
BUN / CREAT RATIO: 12
CALCIUM: 8.9 mg/dL (ref 8.5–10.1)
CHLORIDE: 109 mmol/L — ABNORMAL HIGH (ref 98–107)
CO2: 30.5 mmol/L (ref 21.0–32.0)
CREATININE: 1.05 mg/dL (ref 0.60–1.10)
EGFR CKD-EPI (2021) FEMALE: 60 mL/min/{1.73_m2} (ref >=60)
GLUCOSE RANDOM: 103 mg/dL — ABNORMAL HIGH (ref 70–99)
POTASSIUM: 4.7 mmol/L (ref 3.5–5.0)
PROTEIN TOTAL: 6.9 g/dL (ref 6.0–8.0)
SODIUM: 143 mmol/L (ref 135–145)

## 2023-03-27 LAB — LIPID PANEL
CHOLESTEROL/HDL RATIO SCREEN: 2.7 (ref 1.0–4.5)
CHOLESTEROL: 142 mg/dL (ref <=200)
HDL CHOLESTEROL: 52 mg/dL (ref 40–60)
LDL CHOLESTEROL CALCULATED: 58 mg/dL (ref 40–100)
NON-HDL CHOLESTEROL: 90 mg/dL (ref 70–130)
TRIGLYCERIDES: 161 mg/dL — ABNORMAL HIGH (ref 0–150)
VLDL CHOLESTEROL CAL: 32.2 mg/dL (ref 11–41)

## 2023-03-27 LAB — CBC W/ AUTO DIFF
BASOPHILS ABSOLUTE COUNT: 0 10*9/L (ref 0.0–0.2)
BASOPHILS RELATIVE PERCENT: 0.3 %
EOSINOPHILS ABSOLUTE COUNT: 0.1 10*9/L (ref 0.0–0.4)
EOSINOPHILS RELATIVE PERCENT: 1.5 %
HEMATOCRIT: 39.4 % (ref 34.0–44.0)
HEMOGLOBIN: 13.6 g/dL (ref 11.5–15.0)
LYMPHOCYTES ABSOLUTE COUNT: 2 10*9/L (ref 0.7–4.5)
LYMPHOCYTES RELATIVE PERCENT: 34.6 %
MEAN CORPUSCULAR HEMOGLOBIN CONC: 34.5 g/dL (ref 32.0–36.0)
MEAN CORPUSCULAR HEMOGLOBIN: 30.6 pg (ref 27.0–34.0)
MEAN CORPUSCULAR VOLUME: 88.5 fL (ref 80.0–98.0)
MEAN PLATELET VOLUME: 10.4 fL (ref 7.4–10.4)
MONOCYTES ABSOLUTE COUNT: 0.5 10*9/L (ref 0.1–1.0)
MONOCYTES RELATIVE PERCENT: 7.8 %
NEUTROPHILS ABSOLUTE COUNT: 3.3 10*9/L (ref 1.8–7.8)
NEUTROPHILS RELATIVE PERCENT: 55.6 %
PLATELET COUNT: 166 10*9/L (ref 140–415)
RED BLOOD CELL COUNT: 4.45 10*12/L (ref 3.80–5.10)
RED CELL DISTRIBUTION WIDTH: 12.8 % (ref 11.5–14.5)
WBC ADJUSTED: 5.9 10*9/L (ref 4.0–10.5)

## 2023-03-27 LAB — HEMOGLOBIN A1C
ESTIMATED AVERAGE GLUCOSE: 123 mg/dL
HEMOGLOBIN A1C: 5.9 % — ABNORMAL HIGH (ref 4.8–5.6)

## 2023-03-27 LAB — TSH: THYROID STIMULATING HORMONE: 2.075 u[IU]/mL (ref 0.550–4.780)

## 2023-03-27 NOTE — Unmapped (Signed)
Assessment/Plan:      Diagnoses and all orders for this visit:    Pre-operative clearance  -     Lipid Panel; Future  -     TSH; Future  -     Comprehensive Metabolic Panel; Future  -     CBC w/ Differential; Future  -     Hemoglobin A1c; Future  -     POCT urinalysis dipstick  -     ECG 12 Lead    Type 2 diabetes mellitus without complication, without long-term current use of insulin (CMS-HCC)  -     Lipid Panel; Future  -     TSH; Future  -     Comprehensive Metabolic Panel; Future  -     CBC w/ Differential; Future  -     Hemoglobin A1c; Future    Hyperlipidemia associated with type 2 diabetes mellitus (CMS-HCC)  -     Lipid Panel; Future  -     TSH; Future  -     Comprehensive Metabolic Panel; Future  -     CBC w/ Differential; Future    Primary osteoarthritis of left knee    Fatty liver  -     Comprehensive Metabolic Panel; Future    Class 2 severe obesity due to excess calories with serious comorbidity and body mass index (BMI) of 38.0 to 38.9 in adult (CMS-HCC)    Dysuria  -     POCT urinalysis dipstick  -     Urine Culture; Future    Abnormal urinalysis  -     Urine Culture; Future      Stable, pleasant Lady  Continue current medicines  ECG - NSR - no change from 03/2021  Will get labs  - will send out urn Cx 2/2 abnormal UA- await culture results for Rx. Current S&S are not severe.     Today, BP is BP: 120/84 and BMI is Body mass index is 38.43 kg/m??.  BMI: The patient's BMI is abnormal/obese (BMI >30) and patient is advised to lose weight through proper diet and exercise.  HLD: Monitor labs, continue with statin therapy.  DMII: Monitor lab values., Continue with present medication regimen., For blood glucose monitoring, discussed goals FBS <140 or 1-2 hr PP <200. , For prevenative measures: recommend daily ASA and ACE according to guidelines (unless intolerant or drug allergy).  Daily foot exam, yearly dilated eye exam, BP monitoring, and routine follow-up appointments encouraged., Daily physical exercise, dietary carbohydrate reduction / monitoring, and weight management encouraged.    OSA- needs new study done at next OV - device > 10 yrs     I personally spent 20 minutes face-to-face and non-face-to-face in the care of this patient, which includes all pre, intra, and post visit time on the date of service.  Report any new/worsening symptoms  Return in about 6 months (around 09/24/2023) for Routine followup visit.  Subjective:     Chief Complaint   Patient presents with    surgical clearance     Surgical clearance   Left knee replacement  surgery - May 23 2023    C/o burning with urination x 2-3 days also      HPI:  Stacy Fox is a 63 y.o. female who presents for surgical clearance for left knee replacement by Dr Thamas Jaegers     Current medical conditions:  T2DM - Ozempic last 5.2% in Sept 2024  Hyperlipidemia - Crestor   Fatty Liver/Cirrhosis-followed by GI  stable  OSA- on device- compliant    Reports dealing with burning upon urination for the past couple of days. Denies f/c/s, vaginal discharge, abd pain, constipation/diarrhea or coital activity. Denies urgency, frequency. Taken nothing for S&S    Surgical hx  - numerous surgeries- several under general anesthesia without issues    ROS:  General ROS: negative for - chills, fever or night sweats.  ENT ROS: negative for - sinus pain, sore throat or vocal changes.  Endocrine ROS: negative for - hair pattern changes or skin changes.  Respiratory ROS: no cough, shortness of breath, or wheezing.  Cardiovascular ROS: no chest pain or dyspnea on exertion.  Gastrointestinal ROS: no abdominal pain, change in bowel habits, or black or bloody stools.  Genito-Urinary ROS: no dysuria, trouble voiding, or hematuria.  Musculoskeletal ROS: negative for - muscular weakness.  Dermatological ROS: negative for rash.  Neurological ROS: negative for acute change in gait or cognition.  Psychological ROS: negative for - disorientation, hallucinations or memory difficulties.    Outpatient Medications Prior to Visit   Medication Sig Dispense Refill    aspirin 81 MG chewable tablet Chew 1 tablet (81 mg total) daily. 30 tablet 6    blood sugar diagnostic (ON CALL EXPRESS TEST STRIP) Strp Use to check blood sugar as directed with insulin 3 times a day and for symptoms of high or low blood sugar. 100 each 6    blood-glucose meter kit Use to check blood sugar as directed with insulin 3 times a day & for symptoms of high or low blood sugar. 1 each 0    lancets (ON CALL LANCET) 30 gauge Misc Use to check blood sugar as directed with insulin 3 times a day & for symptoms of high or low blood sugar. 100 each 5    lancing device Misc Use as directed 1 each 0    rosuvastatin (CRESTOR) 20 MG tablet Take 1 tablet (20 mg total) by mouth daily. 90 tablet 1    semaglutide (OZEMPIC) 2 mg/dose (8 mg/3 mL) PnIj Inject 2 mg under the skin every seven (7) days. 3 mL 4    ipratropium (ATROVENT) 21 mcg (0.03 %) nasal spray SPRAY 2 SPRAYS INTO EACH NOSTRIL 3 TIMES A DAY 30 mL 1    predniSONE (DELTASONE) 20 MG tablet Take 1 tablet (20 mg total) by mouth daily. 30 tablet 0     No facility-administered medications prior to visit.       Past Medical History  Past Medical History:   Diagnosis Date    Acute post-traumatic headache     Breast cyst     Cervical spondylosis     Cervicalgia     Chronic pain syndrome     Colon polyp     Degeneration of cervical intervertebral disc     Diabetes mellitus (CMS-HCC)     Disturbance of skin sensation     Fatty liver     GERD (gastroesophageal reflux disease)     Hyperlipidemia     Legally blind     Morbid obesity with BMI of 40.0-44.9, adult (CMS-HCC)     Osteoarthritis     Pain in joint, shoulder region     Pain in thoracic spine     Pneumonia     Rectal bleeding     Sleep apnea     uses cpap    Sprain of neck     Varices of spleen        Family History  Problem Relation Age of Onset    Heart disease Mother     Emphysema Father     Leukemia Sister     Cancer Sister 25        melanoma    Cancer Sister 51        leukemia    Ovarian cancer Sister     Breast cancer Neg Hx     Melanoma Neg Hx     Basal cell carcinoma Neg Hx     Squamous cell carcinoma Neg Hx        Social History  Social History     Socioeconomic History    Marital status: Married     Spouse name: None    Number of children: None    Years of education: None    Highest education level: None   Tobacco Use    Smoking status: Never     Passive exposure: Never    Smokeless tobacco: Never   Vaping Use    Vaping status: Never Used   Substance and Sexual Activity    Alcohol use: Not Currently    Drug use: No    Sexual activity: Not Currently     Partners: Male   Other Topics Concern    Do you use sunscreen? No    Tanning bed use? No    Are you easily burned? No    Excessive sun exposure? No    Blistering sunburns? No     Social Drivers of Psychologist, prison and probation services Strain: Low Risk  (04/07/2022)    Overall Financial Resource Strain (CARDIA)     Difficulty of Paying Living Expenses: Not hard at all   Food Insecurity: No Food Insecurity (03/27/2023)    Hunger Vital Sign     Worried About Running Out of Food in the Last Year: Never true     Ran Out of Food in the Last Year: Never true   Transportation Needs: No Transportation Needs (03/27/2023)    PRAPARE - Therapist, art (Medical): No     Lack of Transportation (Non-Medical): No   Physical Activity: Insufficiently Active (09/16/2020)    Exercise Vital Sign     Days of Exercise per Week: 4 days     Minutes of Exercise per Session: 10 min   Stress: No Stress Concern Present (03/27/2023)    [employer] of Occupational Health - Occupational Stress Questionnaire     Feeling of Stress : Not at all   Social Connections: Socially Integrated (04/07/2022)    Social Connection and Isolation Panel [NHANES]     Frequency of Communication with Friends and Family: Twice a week     Frequency of Social Gatherings with Friends and Family: Twice a week Attends Religious Services: 1 to 4 times per year     Active Member of [employer] or Organizations: No     Attends Engineer, structural: 1 to 4 times per year     Marital Status: Married       Current Medicines:  Outpatient Medications Prior to Visit   Medication Sig Dispense Refill    aspirin 81 MG chewable tablet Chew 1 tablet (81 mg total) daily. 30 tablet 6    blood sugar diagnostic (ON CALL EXPRESS TEST STRIP) Strp Use to check blood sugar as directed with insulin 3 times a day and for symptoms of high or low blood sugar. 100 each 6    blood-glucose meter kit  Use to check blood sugar as directed with insulin 3 times a day & for symptoms of high or low blood sugar. 1 each 0    lancets (ON CALL LANCET) 30 gauge Misc Use to check blood sugar as directed with insulin 3 times a day & for symptoms of high or low blood sugar. 100 each 5    lancing device Misc Use as directed 1 each 0    rosuvastatin (CRESTOR) 20 MG tablet Take 1 tablet (20 mg total) by mouth daily. 90 tablet 1    semaglutide (OZEMPIC) 2 mg/dose (8 mg/3 mL) PnIj Inject 2 mg under the skin every seven (7) days. 3 mL 4    ipratropium (ATROVENT) 21 mcg (0.03 %) nasal spray SPRAY 2 SPRAYS INTO EACH NOSTRIL 3 TIMES A DAY 30 mL 1    predniSONE (DELTASONE) 20 MG tablet Take 1 tablet (20 mg total) by mouth daily. 30 tablet 0     No facility-administered medications prior to visit.       Objective:     Vitals:    03/27/23 0818   BP: 120/84   BP Site: L Arm   BP Position: Sitting   BP Cuff Size: Medium   Pulse: 79   Resp: 20   Temp: 37.1 ??C (98.7 ??F)   TempSrc: Temporal   SpO2: 99%   Weight: 89.7 kg (197 lb 12.8 oz)   Height: 152.8 cm (5' 0.16)     BP Readings from Last 3 Encounters:   03/27/23 120/84   02/13/23 107/74   12/05/22 110/80     Wt Readings from Last 3 Encounters:   03/27/23 89.7 kg (197 lb 12.8 oz)   02/13/23 89.6 kg (197 lb 8 oz)   12/05/22 87.7 kg (193 lb 4.8 oz)      BMI: Estimated body mass index is 38.43 kg/m?? as calculated from the following:    Height as of this encounter: 152.8 cm (5' 0.16).    Weight as of this encounter: 89.7 kg (197 lb 12.8 oz).    BSA: Estimated body surface area is 1.95 meters squared as calculated from the following:    Height as of this encounter: 152.8 cm (5' 0.16).    Weight as of this encounter: 89.7 kg (197 lb 12.8 oz).    Physical Exam:  General: Well-developed and well-nourished.  No apparent distress. Obesity  EYES: Anicteric sclerae. glasses  ENT: Oropharynx moist.  NECK: Normal size and contour. Thyroid smooth and symmetric without focal nodules.   RESP: Relaxed respiratory effort. Clear to auscultation without wheezes or crackles.   CV: Regular rate and rhythm. Normal S1 and S2. No murmurs or gallops.  No lower extremity edema. (B) brachial pulses are 2+ and symmetric.   GI: Normal abdominal bowel sounds. Soft, non-tender, and non-distended. No CVA TTP  MSK: No focal muscle tenderness.  SKIN: Appropriately warm and moist.  No rash appreciated.  NEURO: Stable gait and coordination.  PSYCHIATRIC: Alert and oriented. Speech fluent and sensible. No psychomotor agitation or retardation.    Labs:     Results for orders placed or performed in visit on 03/27/23   Lipid Panel   Result Value Ref Range    Triglycerides 161 (H) 0 - 150 mg/dL    Cholesterol 016 <=010 mg/dL    HDL 52 40 - 60 mg/dL    LDL Calculated 58 40 - 100 mg/dL    VLDL Cholesterol Cal 32.2 11 - 41 mg/dL    Chol/HDL Ratio  2.7 1.0 - 4.5    Non-HDL Cholesterol 90 70 - 130 mg/dL    FASTING     TSH   Result Value Ref Range    TSH 2.075 0.550 - 4.780 uIU/mL   Comprehensive Metabolic Panel   Result Value Ref Range    Sodium 143 135 - 145 mmol/L    Potassium 4.7 3.5 - 5.0 mmol/L    Chloride 109 (H) 98 - 107 mmol/L    CO2 30.5 21.0 - 32.0 mmol/L    Anion Gap 4 3 - 11 mmol/L    BUN 13 8 - 20 mg/dL    Creatinine 2.84 1.32 - 1.10 mg/dL    BUN/Creatinine Ratio 12     eGFR CKD-EPI (2021) Female 60 >=60 mL/min/1.56m2    Glucose 103 (H) 70 - 99 mg/dL    Calcium 8.9 8.5 - 44.0 mg/dL    Albumin 3.8 3.5 - 5.0 g/dL    Total Protein 6.9 6.0 - 8.0 g/dL    Total Bilirubin 1.1 0.3 - 1.2 mg/dL    AST 21 15 - 40 U/L    ALT 35 12 - 78 U/L    Alkaline Phosphatase 55 46 - 116 U/L   POCT urinalysis dipstick   Result Value Ref Range    Clarity, UA      Bilirubin, UA      Ketones, POC Negative Negative    Spec Grav, UA >=1.030 (A) 1.005 - 1.030    Blood, UA Trace-intact (A) Negative    pH, UA 5.5 5.0 - 9.0    Protein, UA Negative Negative    Urobilinogen, UA      Leukocytes, UA Negative Negative    Nitrite, UA Negative Negative    STRIP LOT NUMBER [id number]     STRIP LOT EXPIRATION [id number]

## 2023-03-28 ENCOUNTER — Inpatient Hospital Stay: Admit: 2023-03-28 | Discharge: 2023-03-28 | Payer: PRIVATE HEALTH INSURANCE

## 2023-04-03 ENCOUNTER — Ambulatory Visit
Admit: 2023-04-03 | Discharge: 2023-04-04 | Payer: PRIVATE HEALTH INSURANCE | Attending: Internal Medicine | Primary: Internal Medicine

## 2023-04-03 DIAGNOSIS — K7460 Unspecified cirrhosis of liver: Principal | ICD-10-CM

## 2023-04-03 NOTE — Unmapped (Signed)
[facility]  Phone: [phone]         After Visit Instructions:  -- It was great meeting you today!    -- We have ordered an upper endoscopy procedure to look for dilated blood vessels in the esophagus. You will be contacted to schedule this.     -- We have also ordered an abdominal ultrasound to screen for cancer. This can be done in July 2025.     -- I will see you back in clinic in 6 months.    Thank you for seeing [place] in clinic! If you do not have MyChart, please sign up at [email or web address] MyChart will allow you to view the notes including next steps and test results. If you have questions before our next visit, please send me an electronic message though MyChart or call in at the number below. Please note electronic messaging is only for non-urgent questions and response times can vary (3-5 business days).    Wonda Cerise, MD  [facility] Gastroenterology and Hepatology Fellow    Important phone numbers:  Nurse Contact: Lytle Butte, MSN, RN  p: [phone] - f: [phone]    GI Clinic Appointments:  [phone] option 1 then option 1  Please call the GI Clinic appointment line if you need to schedule, reschedule or cancel an appointment in clinic. They can also answer any questions you may have about where your appointment is located and when you need to arrive.      GI Procedure Appointments:  [phone] option 1 then option 2  Please call the GI Procedures line if you need to schedule, reschedule or cancel any type of GI procedure (endoscopy, colonoscopy, motility testing, etc).  You can also call this number for prep instructions, etc.      Radiology: [phone]  If you are being scheduled for any type of radiology study, you will need to call to receive your appointment time.  Please call this number for information.       For emergencies after normal business hours or on weekends/holidays   Proceed to the nearest emergency room OR contact the [facility] Operator at [phone] who can page the Gastroenterology Fellow on call.

## 2023-04-03 NOTE — Unmapped (Signed)
[facility], [place]        Referring Provider:  Bertram Savin, MD  [address]  [address]  [place],  [place] [address]     Primary Care Provider:  Cindra Eves, FNP    Other Specialist(s):       PATIENT PROFILE:        Stacy Fox is a 63 y.o. female (DOB: 07/13/1960) who is seen in return for MASH  cirrhosis.     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  Nodular liver seen at [facility] which she tolerated without decompensation.  MetS risk factors.  Low MELD. Patient is doing well overall without decompensation events. She has lost 20 lbs since starting Ozempic. She is scheduled for left knee replacement in March 2025 and is low risk for surgery.     MELD 3.0: 8 at 10/03/2022 10:43 AM  MELD-Na: 7 at 10/03/2022 10:43 AM  Calculated from:  Serum Creatinine: 1.02 mg/dL at 0/98/1191 47:82 AM  Serum Sodium: 142 mmol/L (Using max of 137 mmol/L) at 10/03/2022 10:43 AM  Total Bilirubin: 1 mg/dL at 9/56/2130 86:57 AM  Serum Albumin: 3.9 g/dL (Using max of 3.5 g/dL) at 8/46/9629 52:84 AM  INR(ratio): 1.05 at 10/03/2022 10:43 AM  Age at listing (hypothetical): 62 years  Sex: Female at 10/03/2022 10:43 AM    Predicted Postoperative Outcomes by the VOCAL-Penn Score:      30-day mortality: 1.5%      90-day mortality: 4.5%      180-day mortality: 5.3%      90-day decompensation: 5.9%     PLAN:       -Repeat EGD ordered for July 2025   -[place] and AFP scheduled for July 2025 (same time as EGD)  -Low Na diet, increase protein  -Continue GLP1RA  -Low risk for knee replacement if/when needed.  -RTC 6 months        CHIEF COMPLAINT: cirrhosis    HISTORY OF PRESENT ILLNESS: This is a 63 y.o. year old female with a nodular liver seen during [facility].  She has been previously told she had a fatty liver  No ETOH use.  BMI 44, HTN, Lipids, DM.  No over liver symptoms or decompensatin events.  No jaundice, encephalopathy, ascites or GI bleeding. Has been told she has varices on imaging but apparently non on upper endoscopy done several years ago.  Was able to lose 50lbs on phentermine, but gained it all back during pandemic.  Has also been on ozempic in the past.   Interval history  Last seen by Dr. Ruffin Frederick 09/2022.  She has lost 40 lbs on Ozempic which she has been taking for the past year. She recently gained a couple of pounds due to the holidays. She uses a air fryer and tries to avoid butter and carbohydrates. She does not consume any soft drinks and drinks alcohol once per year. She enjoys walking, but is limited by pain in the left knee. She is scheduled for knee replacement in March 2025 at [facility]. No jaundice, encephalopathy, ascites or GI bleeding.     REVIEW OF SYSTEMS:     The balance of 12 systems reviewed is negative except as noted in the HPI.     PAST MEDICAL HISTORY:    Past Medical History:   Diagnosis Date    Acute post-traumatic headache     Breast cyst     Cervical spondylosis     Cervicalgia  Chronic pain syndrome     Colon polyp     Degeneration of cervical intervertebral disc     Diabetes mellitus (CMS-HCC)     Disturbance of skin sensation     Fatty liver     GERD (gastroesophageal reflux disease)     Hyperlipidemia     Legally blind     Morbid obesity with BMI of 40.0-44.9, adult (CMS-HCC)     Osteoarthritis     Pain in joint, shoulder region     Pain in thoracic spine     Pneumonia     Rectal bleeding     Sleep apnea     uses cpap    Sprain of neck     Varices of spleen        PAST SURGICAL HISTORY:    Past Surgical History:   Procedure Laterality Date    BREAST BIOPSY Left     1990's- benign    BREAST LUMPECTOMY Left     Left breast 1990's- Benign cyst removed per patient    CARPAL TUNNEL RELEASE Bilateral     CHOLECYSTECTOMY      03/18/2021    COLONOSCOPY      x 2 (patient thinks 1 polyp removed)    CYSTOSCOPY W/ URETERAL STENT PLACEMENT      kidney stone removed    ELBOW SURGERY Right     nerve release    ESOPHAGOGASTRODUODENOSCOPY      x2    EYE SURGERY Left     tighten left eye muscle    FINGER SURGERY Bilateral     trigger finger and both thumbs    FOOT SURGERY Left     bone shaved    GANGLION CYST EXCISION Left     left wrist (multiple times removed)    HAND SURGERY Right     laceration repair to right hand    HERNIA REPAIR Midline 03/18/2021    JOINT REPLACEMENT Right 2018    right knee replacement    PR COLONOSCOPY FLX DX W/COLLJ SPEC WHEN PFRMD Left 12/30/2014    Procedure: COLONOSCOPY, FLEXIBLE, PROXIMAL TO SPLENIC FLEXURE; DIAGNOSTIC, W/WO COLLECTION SPECIMEN BY BRUSH OR WASH;  Surgeon: Harvest Dark, MD;  Location: Endo Procedures [facility];  Service: General Surgery    PR COLONOSCOPY FLX DX W/COLLJ SPEC WHEN PFRMD N/A 10/21/2020    Procedure: COLONOSCOPY, FLEXIBLE, PROXIMAL TO SPLENIC FLEXURE; DIAGNOSTIC, W/WO COLLECTION SPECIMEN BY BRUSH OR WASH;  Surgeon: Alice Reichert, MD;  Location: ENDO OR [facility];  Service: General Surgery    PR KNEE SCOPE,MED/LAT MENISECTOMY Right 09/02/2014    Procedure: ARTHROSCOPY, KNEE; W/MENISECT(MED/LAT, INCL MENISCAL SHAVE) W/DEBRIDE/SHAVE ARTICULAR CART(CHONDROPLASTY);  Surgeon: Verlin Dike, MD;  Location: [facility];  Service: Orthopedics    PR LAP,CHOLECYSTECTOMY/GRAPH N/A 03/18/2021    Procedure: LAPAROSCOPIC CHOLECYSTECTOMY POSS CHOLANGIOGRAPHY;  Surgeon: Martie Round, MD;  Location: OR [facility];  Service: General Surgery    PR UPPER GI ENDOSCOPY,DIAGNOSIS N/A 05/26/2021    Procedure: UGI ENDO, INCLUDE ESOPHAGUS, STOMACH, & DUODENUM &/OR JEJUNUM; DX W/WO COLLECTION SPECIMN, BY BRUSH OR WASH;  Surgeon: Janyth Pupa, MD;  [facility]: [facility];  Service: Gastroenterology    ROTATOR CUFF REPAIR      left - 2013 and right - 2015       MEDICATIONS:      Current Outpatient Medications:     aspirin 81 MG chewable tablet, Chew 1 tablet (81 mg total) daily., Disp: 30 tablet, Rfl: 6  blood sugar diagnostic (ON CALL EXPRESS TEST STRIP) Strp, Use to check blood sugar as directed with insulin 3 times a day and for symptoms of high or low blood sugar., Disp: 100 each, Rfl: 6    blood-glucose meter kit, Use to check blood sugar as directed with insulin 3 times a day & for symptoms of high or low blood sugar., Disp: 1 each, Rfl: 0    lancets (ON CALL LANCET) 30 gauge Misc, Use to check blood sugar as directed with insulin 3 times a day & for symptoms of high or low blood sugar., Disp: 100 each, Rfl: 5    lancing device Misc, Use as directed, Disp: 1 each, Rfl: 0    rosuvastatin (CRESTOR) 20 MG tablet, Take 1 tablet (20 mg total) by mouth daily., Disp: 90 tablet, Rfl: 1    semaglutide (OZEMPIC) 2 mg/dose (8 mg/3 mL) PnIj, Inject 2 mg under the skin every seven (7) days., Disp: 3 mL, Rfl: 4    ALLERGIES:    Ranitidine    SOCIAL HISTORY:    Social History     Socioeconomic History    Marital status: Married   Tobacco Use    Smoking status: Never     Passive exposure: Never    Smokeless tobacco: Never   Vaping Use    Vaping status: Never Used   Substance and Sexual Activity    Alcohol use: Not Currently    Drug use: No    Sexual activity: Not Currently     Partners: Male   Other Topics Concern    Do you use sunscreen? No    Tanning bed use? No    Are you easily burned? No    Excessive sun exposure? No    Blistering sunburns? No     Social Drivers of Psychologist, prison and probation services Strain: Low Risk  (04/07/2022)    Overall Financial Resource Strain (CARDIA)     Difficulty of Paying Living Expenses: Not hard at all   Food Insecurity: No Food Insecurity (03/27/2023)    Hunger Vital Sign     Worried About Running Out of Food in the Last Year: Never true     Ran Out of Food in the Last Year: Never true   Transportation Needs: No Transportation Needs (03/27/2023)    PRAPARE - Therapist, art (Medical): No     Lack of Transportation (Non-Medical): No   Physical Activity: Insufficiently Active (09/16/2020)    Exercise Vital Sign     Days of Exercise per Week: 4 days     Minutes of Exercise per Session: 10 min   Stress: No Stress Concern Present (03/27/2023)    [employer] of Occupational Health - Occupational Stress Questionnaire     Feeling of Stress : Not at all   Social Connections: Socially Integrated (04/07/2022)    Social Connection and Isolation Panel [NHANES]     Frequency of Communication with Friends and Family: Twice a week     Frequency of Social Gatherings with Friends and Family: Twice a week     Attends Religious Services: 1 to 4 times per year     Active Member of [employer] or Organizations: No     Attends Engineer, structural: 1 to 4 times per year     Marital Status: Married       FAMILY HISTORY:    family history includes Cancer (age of onset: 59) in her sister;  Cancer (age of onset: 16) in her sister; Emphysema in her father; Heart disease in her mother; Leukemia in her sister; Ovarian cancer in her sister.      VITAL SIGNS:    BP 128/79 (BP Site: L Arm, BP Position: Sitting, BP Cuff Size: Medium)  - Pulse 87  - Temp 36.6 ??C (97.9 ??F) (Tympanic)  - Wt 90.5 kg (199 lb 9.6 oz)  - LMP  (LMP Unknown)  - SpO2 99%  - BMI 38.78 kg/m??   Body mass index is 38.78 kg/m??.    PHYSICAL EXAM:    Wt Readings from Last 12 Encounters:   04/03/23 90.5 kg (199 lb 9.6 oz)   03/27/23 89.7 kg (197 lb 12.8 oz)   02/13/23 89.6 kg (197 lb 8 oz)   12/05/22 87.7 kg (193 lb 4.8 oz)   10/03/22 89.3 kg (196 lb 14.4 oz)   04/07/22 91.8 kg (202 lb 6.4 oz)   04/04/22 90.7 kg (200 lb)   02/15/22 94.1 kg (207 lb 8 oz)   11/28/21 97.4 kg (214 lb 12.8 oz)   11/16/21 94.4 kg (208 lb 1.6 oz)   08/23/21 99.7 kg (219 lb 14.4 oz)   08/03/21 99.3 kg (219 lb)      Exam:  Constitutional:   Alert, oriented x 3, no acute distress, well nourished   Mental Status:   Thought organized, appropriate affect, normal fluent speech.   HEENT:   PEERL, conjunctiva clear, anicteric, oropharynx clear, neck supple, no LAD.   Respiratory: Clear to auscultation, and percussion to the bases, unlabored breathing.     Cardiac: Regular rate and rhythm normal S1 and S2, no murmur.      Abdomen: Soft, non-distended, non-tender, no organomegaly or masses.     Perianal/Rectal Exam Not performed.     Extremities:   No edema, well perfused.   Musculoskeletal: No joint swelling or tenderness noted, no deformities.     Skin: Pustular rash on face, mild     Neuro: No focal deficits.          DIAGNOSTIC STUDIES:  I have reviewed all pertinent diagnostic studies, including:    GI Procedures:  - EGD 05/2021: normal esophagus, stomach, and duodenum.    Radiographic studies:  - Abdominal U/S 09/2022: heterogenous liver, no focal liver lesion identified    Laboratory results:  Lab Results   Component Value Date    WBC 5.9 03/27/2023    HGB 13.6 03/27/2023    HCT 39.4 03/27/2023    PLT 166 03/27/2023       Lab Results   Component Value Date    NA 143 03/27/2023    K 4.7 03/27/2023    CL 109 (H) 03/27/2023    CO2 30.5 03/27/2023    BUN 13 03/27/2023    CREATININE 1.05 03/27/2023    GLU 103 (H) 03/27/2023    CALCIUM 8.9 03/27/2023       Lab Results   Component Value Date    BILITOT 1.1 03/27/2023    BILIDIR 0.14 03/16/2021    PROT 6.9 03/27/2023    ALBUMIN 3.8 03/27/2023    ALT 35 03/27/2023    AST 21 03/27/2023    ALKPHOS 55 03/27/2023       Lab Results   Component Value Date    PT 11.7 10/03/2022    INR 1.05 10/03/2022    APTT 23.0 04/05/2016

## 2023-04-11 NOTE — Unmapped (Signed)
Stacy Fox requested a refill of their Ozempic via IVR/Web. The [facility] has scheduled delivery per the patients request via UPS to be delivered to their prescription address on 04/13/23.

## 2023-04-12 MED FILL — OZEMPIC 2 MG/DOSE (8 MG/3 ML) SUBCUTANEOUS PEN INJECTOR: SUBCUTANEOUS | 28 days supply | Qty: 3 | Fill #1

## 2023-04-24 NOTE — Unmapped (Signed)
 Soyla Dryer nursing with preadmission testing at [facility] requesting most recent EKG to be faxed for patients upcoming surgery on 05/23/23  EKG faxed to [phone]

## 2023-05-09 DIAGNOSIS — E785 Hyperlipidemia, unspecified: Principal | ICD-10-CM

## 2023-05-09 DIAGNOSIS — Z760 Encounter for issue of repeat prescription: Principal | ICD-10-CM

## 2023-05-09 MED ORDER — ROSUVASTATIN 20 MG TABLET
ORAL_TABLET | Freq: Every day | ORAL | 1 refills | 90.00 days | Status: CP
Start: 2023-05-09 — End: ?
  Filled 2023-05-14: qty 90, 90d supply, fill #0

## 2023-05-09 NOTE — Unmapped (Signed)
 Patient is requesting the following refill  Requested Prescriptions     Pending Prescriptions Disp Refills    rosuvastatin (CRESTOR) 20 MG tablet 90 tablet 1     Sig: Take 1 tablet (20 mg total) by mouth daily.       Recent Visits  Date Type Provider Dept   03/27/23 Office Visit Cindra Eves, FNP [facility] At [facility]   02/13/23 Office Visit Jules Husbands, Melvenia Needles, DO [facility] At [facility]   12/05/22 Office Visit Jules Husbands, Melvenia Needles, DO [facility] [place]   Showing recent visits within past 365 days and meeting all other requirements  Future Appointments  No visits were found meeting these conditions.  Showing future appointments within next 365 days and meeting all other requirements       Labs: Not applicable this refill

## 2023-05-11 DIAGNOSIS — Z471 Aftercare following joint replacement surgery: Principal | ICD-10-CM

## 2023-05-11 DIAGNOSIS — Z96652 Presence of left artificial knee joint: Principal | ICD-10-CM

## 2023-05-11 DIAGNOSIS — M1712 Unilateral primary osteoarthritis, left knee: Principal | ICD-10-CM

## 2023-05-13 NOTE — Unmapped (Signed)
 Stacy Fox requested a refill of their Ozempic via IVR/Web. The [facility] has scheduled delivery per the patients request via UPS to be delivered to their prescription address on 05/15/23.

## 2023-05-14 MED FILL — OZEMPIC 2 MG/DOSE (8 MG/3 ML) SUBCUTANEOUS PEN INJECTOR: SUBCUTANEOUS | 28 days supply | Qty: 3 | Fill #2

## 2023-05-16 HISTORY — PX: REPLACEMENT TOTAL KNEE: SUR1224

## 2023-05-25 ENCOUNTER — Emergency Department
Admit: 2023-05-25 | Discharge: 2023-05-26 | Disposition: A | Discharge status: Home / Self Care | Payer: PRIVATE HEALTH INSURANCE

## 2023-05-25 LAB — URINALYSIS WITH MICROSCOPY WITH CULTURE REFLEX PERFORMABLE
BILIRUBIN UA: NEGATIVE
BLOOD UA: NEGATIVE
GLUCOSE UA: NEGATIVE
LEUKOCYTE ESTERASE UA: NEGATIVE
NITRITE UA: NEGATIVE
PH UA: 5.5 (ref 5.0–8.0)
PROTEIN UA: NEGATIVE
RBC UA: 1 /HPF (ref 0–5)
SPECIFIC GRAVITY UA: 1.02 (ref 1.010–1.025)
SQUAMOUS EPITHELIAL: 2 /HPF (ref 0–10)
UROBILINOGEN UA: 0.2
WBC UA: 1 /HPF (ref <=5)

## 2023-05-25 LAB — CBC W/ AUTO DIFF
BASOPHILS ABSOLUTE COUNT: 0 10*9/L (ref 0.0–0.2)
BASOPHILS RELATIVE PERCENT: 0.3 %
EOSINOPHILS ABSOLUTE COUNT: 0.2 10*9/L (ref 0.0–0.4)
EOSINOPHILS RELATIVE PERCENT: 2 %
HEMATOCRIT: 32.8 % — ABNORMAL LOW (ref 34.0–44.0)
HEMOGLOBIN: 11.3 g/dL — ABNORMAL LOW (ref 11.5–15.0)
LYMPHOCYTES ABSOLUTE COUNT: 1.7 10*9/L (ref 0.7–4.5)
LYMPHOCYTES RELATIVE PERCENT: 17.8 %
MEAN CORPUSCULAR HEMOGLOBIN CONC: 34.5 g/dL (ref 32.0–36.0)
MEAN CORPUSCULAR HEMOGLOBIN: 30.7 pg (ref 27.0–34.0)
MEAN CORPUSCULAR VOLUME: 89.1 fL (ref 80.0–98.0)
MEAN PLATELET VOLUME: 10.2 fL (ref 7.4–10.4)
MONOCYTES ABSOLUTE COUNT: 0.6 10*9/L (ref 0.1–1.0)
MONOCYTES RELATIVE PERCENT: 6.7 %
NEUTROPHILS ABSOLUTE COUNT: 6.8 10*9/L (ref 1.8–7.8)
NEUTROPHILS RELATIVE PERCENT: 72.8 %
PLATELET COUNT: 159 10*9/L (ref 140–415)
RED BLOOD CELL COUNT: 3.68 10*12/L — ABNORMAL LOW (ref 3.80–5.10)
RED CELL DISTRIBUTION WIDTH: 12.7 % (ref 11.5–14.5)
WBC ADJUSTED: 9.3 10*9/L (ref 4.0–10.5)

## 2023-05-25 LAB — BASIC METABOLIC PANEL
ANION GAP: 4 mmol/L (ref 3–11)
BLOOD UREA NITROGEN: 11 mg/dL (ref 8–20)
BUN / CREAT RATIO: 10
CALCIUM: 8.8 mg/dL (ref 8.5–10.1)
CHLORIDE: 107 mmol/L (ref 98–107)
CO2: 29.3 mmol/L (ref 21.0–32.0)
CREATININE: 1.06 mg/dL (ref 0.60–1.10)
EGFR CKD-EPI (2021) FEMALE: 60 mL/min/{1.73_m2} (ref >=60)
GLUCOSE RANDOM: 131 mg/dL (ref 70–179)
POTASSIUM: 4 mmol/L (ref 3.5–5.0)
SODIUM: 140 mmol/L (ref 135–145)

## 2023-05-25 NOTE — Unmapped (Signed)
 Had left total knee replacement 2 days ago at [facility]. C/o severe pain and temp over 100. Temp 99.1 here.

## 2023-05-25 NOTE — Unmapped (Signed)
 Emergency Department Provider Note        ED Clinical Impression     Final diagnoses:   Total knee replacement status, left   Encounter for postoperative wound check (Primary)   Postoperative pain   Elevated temperature       ED Assessment/Plan       Condition: Stable  Disposition: Discharge    This chart has been completed using Dragon Medical Dictation software, and while attempts have been made to ensure accuracy, certain words and phrases may not be transcribed as intended.     History     Chief Complaint   Patient presents with    Knee Pain         Stacy Fox is a 63 y.o. female  who presents today to the  emergency department reporting a temperature of 100.6 around 3 PM today.  She also reports an increase in her knee pain around this time.  Patient underwent a left total knee replacement at an outside hospital [facility]) 2 days ago and was discharged home yesterday.  Review of medical record shows that she was called by the Ortho RN at 10:30 AM today and denied fever at that time.  She was discharged on aspirin 325 twice daily and is compliant with this.    Patient states she called the orthopedist after-hours line but after being on hold twice for 30 minutes each, she decided to come to the [facility] ER.  She states she has only taken 2 oxycodone today-last at 5 PM and her last Tylenol was at 11 AM.       Patient denies any symptoms such as runny nose/sore throat/cough/pain or burning with urination/foul odor to urine/vomiting/diarrhea/rash on the skin.  She does however mention new onset urinary frequency over the past 1-2 days.  She denies any prior history of recurrent UTI.             PMHx:  has a past medical history of Acute post-traumatic headache, Breast cyst, Cervical spondylosis, Cervicalgia, Chronic pain syndrome, Colon polyp, Degeneration of cervical intervertebral disc, Diabetes mellitus, Disturbance of skin sensation, Fatty liver, GERD (gastroesophageal reflux disease), Hyperlipidemia, Legally blind, Morbid obesity with BMI of 40.0-44.9, adult (CMS-HCC), Osteoarthritis, Pain in joint, shoulder region, Pain in thoracic spine, Pneumonia, Rectal bleeding, Sleep apnea, Sprain of neck, and Varices of spleen.    PSHx:  has a past surgical history that includes Rotator cuff repair; Finger surgery (Bilateral); pr knee scope,med/lat menisectomy (Right, 09/02/2014); Elbow surgery (Right); Colonoscopy; Esophagogastroduodenoscopy; pr colonoscopy flx dx w/collj spec when pfrmd (Left, 12/30/2014); Breast biopsy (Left); Joint replacement (Right, 2018); pr colonoscopy flx dx w/collj spec when pfrmd (N/A, 10/21/2020); Carpal tunnel release (Bilateral); Cystoscopy w/ ureteral stent placement; Foot surgery (Left); Ganglion cyst excision (Left); Hand surgery (Right); Eye surgery (Left); pr lap,cholecystectomy/graph (N/A, 03/18/2021); Hernia repair (Midline, 03/18/2021); Cholecystectomy; pr upper gi endoscopy,diagnosis (N/A, 05/26/2021); and Breast lumpectomy (Left).    SocHx:  reports that she has never smoked. She has never been exposed to tobacco smoke. She has never used smokeless tobacco. She reports that she does not currently use alcohol. She reports that she does not use drugs.    Allergies: is allergic to ranitidine.     Medications: has a current medication list which includes the following long-term medication(s): aspirin, blood-glucose meter, lancing device, and rosuvastatin.    Allergies, Medications, Medical, Surgical, and Social History were reviewed as documented above.      Chart Review:  -Prior admission reviewed  by me      Review Of Systems    {As per HPI            Physical Exam     BP 129/80  - Pulse 99  - Temp 37.3 ??C (99.1 ??F) (Oral)  - Resp 18  - Ht 149.9 cm (4' 11)  - Wt 88.9 kg (196 lb)  - LMP  (LMP Unknown)  - SpO2 97%  - BMI 39.59 kg/m??     Body mass index is 39.59 kg/m??.    Constitutional: Vital signs reviewed, very well appearing, Patient appears comfortable, There is no acute distress, Not ill or toxic appearing.  + Wearing hospital socks, sitting upright in chair with walker at bedside, chatting with husband  Head: Atraumatic  Eyes: Normal to inspection.  Neck: Normal ROM.  Respiratory: No respiratory distress, Speaks full sentences, Unlabored.  CV: Normal Rate  Skin: Skin is warm, dry, normal color, No rash.  Neuro: Speech normal, Normal mental status, MAEW equally.  Psych: Normal affect, Alert, oriented.     Left Knee -there is no focal bony TTP, no erythema/cellulitis surrounding the left knee, there is moderate effusion and bandage overlying is C/D/I, patient is in compressive wrap, with ice pack.  + She does have some moderate focal soft tissue TTP over the medial proximal aspect of her left calf, no erythema or obviously palpable cord.   warm / WP. Soft compartments     Note:  Entire physical exam is limited by morbid obesity      ED Course    Procedures          MEDICAL DECISION MAKING AND PLAN OF CARE     10:00 PM  Exam and HPI c/w likely expected postop left knee pain given that patient is only 2 days postop and has only taken 2 oxycodone today.  Temperature in the ER was 99.1 and patient reports she only had a single temperature of 100.6 ??F at home.  This may be postop inflammation.  On exam, there are no obvious external signs of wound infection or septic arthritis although this would be quite challenging to diagnose clinically given the degree of swelling.  I do have some concern about the focal soft tissue swelling along her left posterior calf.  We do not have Doppler ultrasound available at this facility at this time and patient states she is compliant with the 325 mg aspirin.  There is no associated chest pain or shortness of breath.  Plan oxycodone 7.5 mg, will check UA, and will contact Dr. Thamas Jaegers or covering physician-Ortho at [facility].  Noted that labs and x-ray are unremarkable.    11:05 PM-. - D/w Dr. Thad Ranger, covering for orthopedics at [facility]- Pt's presentation / exam / PMH / labs / imaging / vitals signs reviewed in detail.  Agreed with plan-he understands patient had low-grade temperature 100.6 x 1 at home today.  No fever in the ER but patient is maintained on acetaminophen at home.  He understands patient is extremely well-appearing, OpSite is C/D/I with no unusual features or visible cellulitis.  He understands the patient may have been undermedicated for pain and also reports that the timing of increased pain may correspond with the nerve block wearing off.  He recommends patient call the orthopedics office on Monday but does also state that there is a PA on call over the weekend.  He agrees that the patient needs to contact the on-call Ortho PA if any  type of fever recurs over the weekend.    11:50 PM- Re-exam - Pt  reports her pain is much mproved after 7.5 mg oxycodone in the ER, NAD, VSS, no visible indicators of pain or distress. patient remains very well-appearing.  I discussed with patient and husband that labs and urine are unremarkable-no evidence UTI.  She denies any signs or symptoms of infection.  I discussed with them plan per Dr. Thad Ranger to call Ortho on Monday but extensive verbal and written care instructions given that the patient does need to call the PA Ortho on call if any fever recurs this weekend.  I have encouraged her to continue her oxycodone every 4-6 hours for at least 2 doses in the morning to stay ahead of pain and she agrees with this plan.       Extensive d/w pt and husband - Pt understands dx   and agrees with plan as above, close outpt f/u , agrees to return if any signs or symptoms of worsening or if not improving as expected.  Pt ambulates easily and without limitation on discharge.    After careful consideration of the patient's presentation and clinical course, at this time there does not appear to be an indication for further emergent evaluation or intervention, nor is there an indication for admission to the hospital.  At the time of discharge, I do not think, to the best of my medical judgment, that an emergent medical condition exists, and the patient is discharged home well-appearing, in stable and satisfactory condition.  Discharge diagnosis, instructions and plan were discussed and understood.  At the time of discharge the patient appeared comfortable and was in no apparent distress.  Extensive verbal and written care and strict return instructions were given by me. The patient was instructed that if their symptoms worsened or if they have any additional concerns that they should return to the emergency room immediately.      MEDICAL DECISION MAKING     IMCJ, LOW SUSPICION FOR FRACTURE / DISLOCATION / VASCULAR INJURY OR DISSECTION / MOTOR OR SENSORY DEFICIT / JOINT INSTABILITY / GOUT / ZOSTER / CELLULITIS / SEPTIC ARTHRITIS /postop wound infection/OSTEOMYELITIS / DVT / ISCHEMIA / RHABDO / COMPARTMENT SYNDROME / NEUROMA / RETAINED FB /  BURSITIS / FUNCTIONAL TENDON DEFICIT OR RUPTURE.           Notes:  Prior to seeing the patient, the triage note and nursing notes were reviewed, and I agree, except for where my documentation differs.   Old records obtained, reviewed, and summarized in PMH (unless unavailable).  All laboratory values, imaging studies, and EKG's were personally reviewed by me (when ordered).  Additional history obtained from: Husband    Osker Mason, MD  Emergency Attending Physician        ED Results  Results for orders placed or performed during the hospital encounter of 05/25/23   Blood Culture x 1    Specimen: 1 Peripheral Draw; Blood   Result Value Ref Range    Blood Culture, Routine No Growth at 5 days    Basic metabolic panel   Result Value Ref Range    Sodium 140 135 - 145 mmol/L    Potassium 4.0 3.5 - 5.0 mmol/L    Chloride 107 98 - 107 mmol/L    CO2 29.3 21.0 - 32.0 mmol/L    Anion Gap 4 3 - 11 mmol/L BUN 11 8 - 20 mg/dL    Creatinine 1.61 0.96 - 1.10 mg/dL  BUN/Creatinine Ratio 10     eGFR CKD-EPI (2021) Female 60 >=60 mL/min/1.65m2    Glucose 131 70 - 179 mg/dL    Calcium 8.8 8.5 - 09.8 mg/dL   CBC w/ Differential   Result Value Ref Range    WBC 9.3 4.0 - 10.5 10*9/L    RBC 3.68 (L) 3.80 - 5.10 10*12/L    HGB 11.3 (L) 11.5 - 15.0 g/dL    HCT 11.9 (L) 14.7 - 44.0 %    MCV 89.1 80.0 - 98.0 fL    MCH 30.7 27.0 - 34.0 pg    MCHC 34.5 32.0 - 36.0 g/dL    RDW 82.9 56.2 - 13.0 %    MPV 10.2 7.4 - 10.4 fL    Platelet 159 140 - 415 10*9/L    Neutrophils % 72.8 %    Lymphocytes % 17.8 %    Monocytes % 6.7 %    Eosinophils % 2.0 %    Basophils % 0.3 %    Absolute Neutrophils 6.8 1.8 - 7.8 10*9/L    Absolute Lymphocytes 1.7 0.7 - 4.5 10*9/L    Absolute Monocytes 0.6 0.1 - 1.0 10*9/L    Absolute Eosinophils 0.2 0.0 - 0.4 10*9/L    Absolute Basophils 0.0 0.0 - 0.2 10*9/L   Urinalysis with Microscopy with Culture Reflex   Result Value Ref Range    Color, UA Yellow     Clarity, UA Clear Clear    Specific Gravity, UA 1.020 1.010 - 1.025    pH, UA 5.5 5.0 - 8.0    Leukocyte Esterase, UA Negative Negative    Nitrite, UA Negative Negative    Protein, UA Negative Negative    Glucose, UA Negative Negative    Ketones, UA Trace (A) Negative    Urobilinogen, UA 0.2 mg/dL 0.1 - 1.0 mg/dL    Bilirubin, UA Negative Negative    Blood, UA Negative Negative    RBC, UA 1 0 - 5 /HPF    WBC, UA 1 <=5 /HPF    Squam Epithel, UA 2 0 - 10 /HPF    Bacteria, UA Occasional Few, Occasional, Small, None Seen /HPF     No results found.      Medications Administered:   Medications   oxyCODONE (ROXICODONE) immediate release tablet 7.5 mg (7.5 mg Oral Given 05/25/23 2237)       Discharge Medications (Medications Prescribed during this  ED visit and Patient's Home Medications) :      Your Medication List        ASK your doctor about these medications      aspirin 81 MG chewable tablet  Chew 1 tablet (81 mg total) daily.     ON CALL EXPRESS METER Misc  Generic drug: blood-glucose meter  Use to check blood sugar as directed with insulin 3 times a day & for symptoms of high or low blood sugar.     ON CALL EXPRESS TEST STRIP Strp  Generic drug: blood sugar diagnostic  Use to check blood sugar as directed with insulin 3 times a day and for symptoms of high or low blood sugar.     ON CALL LANCET 30 gauge Misc  Generic drug: lancets  Use to check blood sugar as directed with insulin 3 times a day & for symptoms of high or low blood sugar.     ON CALL LANCING DEVICE Misc  Generic drug: lancing device  Use as directed     OZEMPIC 2 mg/dose (  8 mg/3 mL) Pnij  Generic drug: semaglutide  Inject 2 mg under the skin every seven (7) days.     rosuvastatin 20 MG tablet  Commonly known as: CRESTOR  Take 1 tablet (20 mg total) by mouth daily.                 Joannie Springs, MD  06/14/23 2200

## 2023-05-26 MED ADMIN — oxyCODONE (ROXICODONE) immediate release tablet 7.5 mg: 7.5 mg | ORAL | @ 03:00:00 | Stop: 2023-05-25

## 2023-05-28 ENCOUNTER — Ambulatory Visit: Admit: 2023-05-28 | Payer: PRIVATE HEALTH INSURANCE

## 2023-05-28 ENCOUNTER — Ambulatory Visit: Admit: 2023-05-28 | Discharge: 2023-06-26 | Payer: Medicaid (Managed Care)

## 2023-05-28 ENCOUNTER — Ambulatory Visit: Admit: 2023-05-28

## 2023-05-28 ENCOUNTER — Ambulatory Visit: Admit: 2023-05-28 | Discharge: 2023-06-26

## 2023-05-28 DIAGNOSIS — M1712 Unilateral primary osteoarthritis, left knee: Principal | ICD-10-CM

## 2023-05-28 DIAGNOSIS — Z471 Aftercare following joint replacement surgery: Principal | ICD-10-CM

## 2023-05-28 DIAGNOSIS — Z96652 Presence of left artificial knee joint: Principal | ICD-10-CM

## 2023-05-29 NOTE — Unmapped (Signed)
 [facility] [place]  OUTPATIENT PHYSICAL THERAPY  05/28/2023  Note Type: Evaluation       Patient Name: Stacy Fox  Date of Birth:[birth year]  Diagnosis:   Encounter Diagnoses   Name Primary?    Osteoarthritis of left knee, unspecified osteoarthritis type Yes    Aftercare following left knee joint replacement surgery      Referring MD:  Charlestine Massed, PA     Date of Onset of Impairment-No date available  Date PT Care Plan Established or Reviewed-No date available  Date PT Treatment Started-No date available   Visit Count: 1  Plan of Care Effective Date: 05/28/2023 - 07/28/2023     Visit 1/16 Reassessment due 06/28/23    Assessment/Plan:    Assessment  Assessment details:    Impairments indicating medical necessity and functional limitations include: significant lower extremity weakness, limited ROM, pain, balance and gait impairments that are limiting patient's ability to complete basic IADL's or home/community ambulation without difficulty. Treatment classification: s/p left TKA. Patient will benefit from skilled PT intervention to address current body structure impairments and activity limitations to return to functions of community and household mobility.           Impairments: decreased range of motion, gait deviation, impaired flexibility, decreased mobility, decreased strength, fall risk, impaired balance and pain          Examination of Body Systems: musculoskeletal    Clinical Presentation: stable    Clinical Decision Making: moderate    Prognosis: good prognosis    Positive Prognosis Rationale: motivated for treatment and response to trial tx.      Therapy Goals      Goals:      GOALS:  Short-term Goals to be achieved in 3 weeks. Patient will:  1. Independent with home exercise program for continued self treatment.  2. Improve L knee AROM by 15 degrees for improved community mobility.  3. Improve LLE strength by ?? muscle grade for improved community mobility.  4. Improve WOMAC Functional Scale by 10pts.  5. Improve L SLS > 15 SECONDS or better for community ambulation.         Long-term Goals to be achieved in 8 weeks. Patient will:  1. Have LLE VMO strength 4+/5 or greater for normal community ambulation.  2. Ambulate greater than 1060ft for normal community ambulation painfree.  3. Ambulate 1 full flight of stairs at and independent level with reciprocal gait.  4. Improve WOMAC Functional Scale 20pts.  5. Improve squatting ability WFL and painfree.      Plan    Therapy options: will be seen for skilled physical therapy services    Planned therapy interventions: 97140-Manual Therapy, 97530-Therapeutic Activities, 97750-Physical Performance Test, 97116-Gait Training, 97110-Therapeutic Exercises, 97112-Neuromuscular Re-education, 97032, G0283-Electrical Stimulation (unattended, attended), 97016-Pneumatic Devices (compression pump) and 97010-Cold Packs/Hot Packs      Frequency: 2x week    Duration in weeks: 8    Education provided to: patient.    Education provided: HEP, Fall prevention and Symptom management    Education results: needs reinforcement and verbalized good understanding.    Communication/Consultation: Discussed with PTA and Initial note sent to Referring Provider.      Total Session Time: 60    Treatment rendered today:      Therapeutic Treatment    TE: 30 min.  -Seated heel to toe rocks x 30  -Seated heel slides x 30  -Seated towel stretch for calf/hamstring x 4  -Seated heel props x  4 min.  -Supine quad sets on towel roll 15 x 5 sec  -Supine heel slides with strap for AAROM x 20    Modalities 10 min.  Vasopneumatic compression and cold at 40 degrees, 5-15 mm Hg x 10 min elevated.    Plan details: Modalities (heat, ice, ultrasound, electrical stimulation, iontophoresis), as needed for pain management. Massage, mobilization and manual therapy as needed to increase ROM in restricted soft tissues and joints. Therapeutic exercise, Neuromuscular Re-education, and education.      Subjective: History of Present Condition     Date of surgery:  05/23/2023    History of Present Condition/Chief Complaint:  [name] who presents to PT s/p Left knee replacement. Patient presents WBAT with FWW, dressings intact. Patient with history of prior Right TKA.  Subjective:  Patient reports she was concerned regarding calf pain over weekend and went to ER to get it checked out.     Quality of life:  Good  Pain:     Current pain rating:  6    At best pain rating:  6    At worst pain rating:  10  Location:  Circumferentially left knee    Quality:  Aching, tender and stiffness    Relieving factors:  Ice and rest    Aggravating factors:  As the day progresses, bending and performance of leg dominant activites  Precautions/Equipment   Precautions:  Weight bearing as tolerated    Current Braces/Orthoses:  None    Equipment Currently Used:  Rolling walker  Prior Functional Status     Physical limitation(s):  Patient reports limitations of 70% per [facility]  Current Functional Status:    disturbed sleep, limited bending, limited household activities, unsteady gait, limited walking tolerance and limited standing tolerance  Social Support:     Hand dominance:  Right    Communication Preference:  Verbal, written and visual  Treatments:     Previous treatment:  Surgery  Patient Goals:     Patient/Family goals for therapy:  Improved balance, improved ambulation, increased ROM and increased strength      Objective:       Range of Motion  Knee  Active ROM  Left Knee-Active ROM  Flexion: 70 degrees   Extension: 8 degrees   Passive ROM  Left Knee-Passive ROM    Flexion: 75 degrees   Extension: 0 degrees     Strength  Left Hip   Planes of Motion   Flexion: 4+  Extension: 4+  Abduction: 4  Adduction: 4+  Left Knee   Flexion: 4  Extension: 4-  Additional Strength Details   VMO R=5/5, L=3/5,      Tests   Additional Tests Details:     WOMAC 68/96=70% impairment                                  I attest that I have reviewed the above information.  Signed: Linna Darner, PT  05/28/2023 6:57 AM

## 2023-05-31 NOTE — Unmapped (Signed)
 [facility]YS[facility]ERAP[facility] [place]  OUTPATIENT PHYSICAL THERAPY  05/30/2023  Note Type: Treatment Note       Patient Name: Stacy Fox  Date of Birth:06-22-1960  Diagnosis:   Encounter Diagnoses   Name Primary?    Osteoarthritis of left knee, unspecified osteoarthritis type Yes    Aftercare following left knee joint replacement surgery      Referring MD:  Charlestine Massed, PA     Date of Onset of Impairment-No date available  Date PT Care Plan Established or Reviewed-05/29/2023  Date PT Treatment Started-No date available   Visit Count: 2  Plan of Care Effective Date: 05/28/2023 - 07/28/2023     Visit 2/16 Reassessment due 06/28/23    Assessment/Plan:    Assessment  Assessment details:    Patient progressing well per protocol with additional activities of gait mechanics training, weight bearing functional strengthening added today.     Patient will benefit from skilled PT intervention to address current body structure impairments and activity limitations to return to functions of community and household mobility.           Impairments: decreased range of motion, gait deviation, impaired flexibility, decreased mobility, decreased strength, fall risk, impaired balance and pain          Examination of Body Systems: musculoskeletal    Clinical Presentation: stable    Clinical Decision Making: moderate    Prognosis: good prognosis    Positive Prognosis Rationale: motivated for treatment and response to trial tx.      Therapy Goals      Goals:      GOALS:  Short-term Goals to be achieved in 3 weeks. Patient will:  1. Independent with home exercise program for continued self treatment.  2. Improve L knee AROM by 15 degrees for improved community mobility.  3. Improve LLE strength by ?? muscle grade for improved community mobility.  4. Improve WOMAC Functional Scale by 10pts.  5. Improve L SLS > 15 SECONDS or better for community ambulation.         Long-term Goals to be achieved in 8 weeks. Patient will:  1. Have LLE VMO strength 4+/5 or greater for normal community ambulation.  2. Ambulate greater than 1059ft for normal community ambulation painfree.  3. Ambulate 1 full flight of stairs at and independent level with reciprocal gait.  4. Improve WOMAC Functional Scale 20pts.  5. Improve squatting ability WFL and painfree.      Plan    Therapy options: will be seen for skilled physical therapy services    Planned therapy interventions: 97140-Manual Therapy, 97530-Therapeutic Activities, 97750-Physical Performance Test, 97116-Gait Training, 97110-Therapeutic Exercises, 97112-Neuromuscular Re-education, 97032, G0283-Electrical Stimulation (unattended, attended), 97016-Pneumatic Devices (compression pump) and 97010-Cold Packs/Hot Packs      Frequency: 2x week    Duration in weeks: 8    Education provided to: patient.    Education provided: HEP, Fall prevention and Symptom management    Education results: needs reinforcement and verbalized good understanding.    Communication/Consultation: Discussed with PTA and Initial note sent to Referring Provider.      Total Session Time: 60    Treatment rendered today:      Therapeutic Treatment    TE: 30 min.  -Nustep level 1 x 8 min  -standing heel raises x 20  -Standing toe raises x 20  -standing hip abd, hip extenion x 20 each  -Standing marching with B UE support x 20  -Standing knee flexion x 20  -  Seated heel to toe rocks x 30  -Seated heel slides x 30  -Seated towel stretch for calf/hamstring x 4  -Seated heel props x 4 min.  -Supine quad sets on towel roll 15 x 5 sec  -Supine heel slides with strap for AAROM x 20      Manual 5 min.  Patient supine, PROM flexion and extension per tolerance with 5 sec hold x 10 reps each    TA: 8 min.  -Gait training: heel strike, toe off with proper sequencing and step through cycle with FWW, 2 x 40 feet and 6 laps in parallal bars    Modalities 15 min.  Vasopneumatic compression and cold at 40 degrees, 5-15 mm Hg x 15 min elevated.    Plan details: Modalities (heat, ice, ultrasound, electrical stimulation, iontophoresis), as needed for pain management. Massage, mobilization and manual therapy as needed to increase ROM in restricted soft tissues and joints. Therapeutic exercise, Neuromuscular Re-education, and education.      Subjective:     History of Present Condition     Date of surgery:  05/23/2023    History of Present Condition/Chief Complaint:  [name] who presents to PT s/p Left knee replacement. Patient presents WBAT with FWW, dressings intact. Patient with history of prior Right TKA.  Subjective:  Patient reports she was concerned regarding calf pain over weekend and went to ER to get it checked out.     Quality of life:  Good  Pain:     Current pain rating:  6    At best pain rating:  6    At worst pain rating:  10  Location:  Circumferentially left knee    Quality:  Aching, tender and stiffness    Relieving factors:  Ice and rest    Aggravating factors:  As the day progresses, bending and performance of leg dominant activites  Precautions/Equipment   Precautions:  Weight bearing as tolerated    Current Braces/Orthoses:  None    Equipment Currently Used:  Rolling walker  Prior Functional Status     Physical limitation(s):  Patient reports limitations of 70% per [facility]  Current Functional Status:    disturbed sleep, limited bending, limited household activities, unsteady gait, limited walking tolerance and limited standing tolerance  Social Support:     Hand dominance:  Right    Communication Preference:  Verbal, written and visual  Treatments:     Previous treatment:  Surgery  Patient Goals:     Patient/Family goals for therapy:  Improved balance, improved ambulation, increased ROM and increased strength      Objective:       Range of Motion  Knee  Active ROM  Left Knee-Active ROM  Flexion: 70 degrees   Extension: 8 degrees   Passive ROM  Left Knee-Passive ROM    Flexion: 75 degrees   Extension: 0 degrees     Strength  Left Hip Planes of Motion   Flexion: 4+  Extension: 4+  Abduction: 4  Adduction: 4+  Left Knee   Flexion: 4  Extension: 4-  Additional Strength Details   VMO R=5/5, L=3/5,      Tests   Additional Tests Details:     WOMAC 68/96=70% impairment                                  I attest that I have reviewed the above  information.  Signed: Linna Darner, PT  05/30/2023 7:08 AM

## 2023-06-05 NOTE — Unmapped (Signed)
 [facility] PHYSICAL THERAPY [place]  OUTPATIENT PHYSICAL THERAPY  06/04/2023  Note Type: Treatment Note       Patient Name: Stacy Fox  Date of Birth:[birth year]  Diagnosis:   Encounter Diagnoses   Name Primary?    Osteoarthritis of left knee, unspecified osteoarthritis type Yes    Aftercare following left knee joint replacement surgery      Referring MD:  Charlestine Massed, PA     Date of Onset of Impairment-No date available  Date PT Care Plan Established or Reviewed-05/29/2023  Date PT Treatment Started-No date available   Visit Count: 3  Plan of Care Effective Date: 05/28/2023 - 07/28/2023     Visit 3/16 Reassessment due 06/28/23    Assessment/Plan:    Assessment  Assessment details:    Patient progressing well per protocol with additional activities of gait mechanics training, weight bearing functional strengthening added today with noted improvement in AAROM. Initiated functional activities with B UE support in parallal bars today with modifications.    Patient will benefit from skilled PT intervention to address current body structure impairments and activity limitations to return to functions of community and household mobility.           Impairments: decreased range of motion, gait deviation, impaired flexibility, decreased mobility, decreased strength, fall risk, impaired balance and pain          Examination of Body Systems: musculoskeletal    Clinical Presentation: stable    Clinical Decision Making: moderate    Prognosis: good prognosis    Positive Prognosis Rationale: motivated for treatment and response to trial tx.      Therapy Goals      Goals:      GOALS:  Short-term Goals to be achieved in 3 weeks. Patient will:  1. Independent with home exercise program for continued self treatment.  2. Improve L knee AROM by 15 degrees for improved community mobility.  3. Improve LLE strength by ?? muscle grade for improved community mobility.  4. Improve WOMAC Functional Scale by 10pts.  5. Improve L SLS > 15 SECONDS or better for community ambulation.         Long-term Goals to be achieved in 8 weeks. Patient will:  1. Have LLE VMO strength 4+/5 or greater for normal community ambulation.  2. Ambulate greater than 1032ft for normal community ambulation painfree.  3. Ambulate 1 full flight of stairs at and independent level with reciprocal gait.  4. Improve WOMAC Functional Scale 20pts.  5. Improve squatting ability WFL and painfree.      Plan    Therapy options: will be seen for skilled physical therapy services    Planned therapy interventions: 97140-Manual Therapy, 97530-Therapeutic Activities, 97750-Physical Performance Test, 97116-Gait Training, 97110-Therapeutic Exercises, 97112-Neuromuscular Re-education, 97032, G0283-Electrical Stimulation (unattended, attended), 97016-Pneumatic Devices (compression pump) and 97010-Cold Packs/Hot Packs      Frequency: 2x week    Duration in weeks: 8    Education provided to: patient.    Education provided: HEP, Fall prevention and Symptom management    Education results: needs reinforcement and verbalized good understanding.    Communication/Consultation: Discussed with PTA and Initial note sent to Referring Provider.      Total Session Time: 60    Treatment rendered today:      Therapeutic Treatment  DOS 05/23/23  TE: 30 min.  -Nustep level 4 x 12 min (seat begun at #7 and moved to #5 half way through)  -Standing step stretch for flexion 4 x  10 sec  -Standing step stretch for extension 4 x 10 sec  -slant board stretch 4 x 20 sec    -standing heel raises x 20  -Standing toe raises x 20  -standing hip abd, hip extenion x 20 each  -Standing marching with B UE support x 20  -Standing knee flexion x 20  -Supine heel slides with foot on wall for AAROM x 4 min continuous    TA: 22 min.  -Step through gait cycle with SPC for proper heel strike, toe off x 120 feet  -Step ups on 4 inch step with B UE support x 20  -Step downs off 4 inch step with B UE support x 20  -Sit to stands off airex in chair with B UE support x 10  -Gait training: heel strike, toe off with proper sequencing and step through cycle with FWW, 2 x 40 feet and 6 laps in parallal bars    Manual 5 min.  Patient supine, PROM flexion and extension per tolerance with 5 sec hold x 10 reps each      Modalities 15 min.  Vasopneumatic compression and cold at 40 degrees, 5-15 mm Hg x 15 min elevated.    Plan details: Modalities (heat, ice, ultrasound, electrical stimulation, iontophoresis), as needed for pain management. Massage, mobilization and manual therapy as needed to increase ROM in restricted soft tissues and joints. Therapeutic exercise, Neuromuscular Re-education, and education.      Subjective:     History of Present Condition     Date of surgery:  05/23/2023    History of Present Condition/Chief Complaint:  [name] who presents to PT s/p Left knee replacement. Patient presents WBAT with FWW, dressings intact. Patient with history of prior Right TKA.  Subjective:  Patient reports she took off dressing and changed dressing per MD orders.     Quality of life:  Good  Pain:     Current pain rating:  6    At best pain rating:  6    At worst pain rating:  10  Location:  Circumferentially left knee    Quality:  Aching, tender and stiffness    Relieving factors:  Ice and rest    Aggravating factors:  As the day progresses, bending and performance of leg dominant activites  Precautions/Equipment   Precautions:  Weight bearing as tolerated    Current Braces/Orthoses:  None    Equipment Currently Used:  Rolling walker  Prior Functional Status     Physical limitation(s):  Patient reports limitations of 70% per [facility]  Current Functional Status:    disturbed sleep, limited bending, limited household activities, unsteady gait, limited walking tolerance and limited standing tolerance  Social Support:     Hand dominance:  Right    Communication Preference:  Verbal, written and visual  Treatments:     Previous treatment: Surgery  Patient Goals:     Patient/Family goals for therapy:  Improved balance, improved ambulation, increased ROM and increased strength      Objective:       Range of Motion  Knee  Active ROM  Left Knee-Active ROM  Flexion: 70 degrees   Extension: 8 degrees   Passive ROM  Left Knee-Passive ROM    Flexion: 75 degrees   Extension: 0 degrees     Strength  Left Hip   Planes of Motion   Flexion: 4+  Extension: 4+  Abduction: 4  Adduction: 4+  Left Knee  Flexion: 4  Extension: 4-  Additional Strength Details   VMO R=5/5, L=3/5,      Tests   Additional Tests Details:     WOMAC 68/96=70% impairment                                  I attest that I have reviewed the above information.  Signed: Linna Darner, PT  06/04/2023 6:08 AM

## 2023-06-07 DIAGNOSIS — E119 Type 2 diabetes mellitus without complications: Principal | ICD-10-CM

## 2023-06-07 DIAGNOSIS — Z6841 Body Mass Index (BMI) 40.0 and over, adult: Principal | ICD-10-CM

## 2023-06-08 NOTE — Unmapped (Signed)
 [facility] PHYSICAL THERAPY [place]  OUTPATIENT PHYSICAL THERAPY  06/07/2023  Note Type: Treatment Note       Patient Name: Stacy Fox  Date of Birth:08/15/1960  Diagnosis:   Encounter Diagnoses   Name Primary?    Osteoarthritis of left knee, unspecified osteoarthritis type Yes    Aftercare following left knee joint replacement surgery      Referring MD:  Charlestine Massed, PA     Date of Onset of Impairment-No date available  Date PT Care Plan Established or Reviewed-05/29/2023  Date PT Treatment Started-No date available   Visit Count: 4  Plan of Care Effective Date: 05/28/2023 - 07/28/2023     Visit 4/16 Reassessment due 06/28/23    Assessment/Plan:    Assessment  Assessment details:    Patient progressing well per protocol with additional activities of gait mechanics training, weight bearing functional strengthening with noted AAROM of knee flexion to 80 degrees. Continued with functional activities with B UE support in parallal bars today with modifications. Ambulated into clinic with [facility] today.    Patient will benefit from skilled PT intervention to address current body structure impairments and activity limitations to return to functions of community and household mobility.           Impairments: decreased range of motion, gait deviation, impaired flexibility, decreased mobility, decreased strength, fall risk, impaired balance and pain          Examination of Body Systems: musculoskeletal    Clinical Presentation: stable    Clinical Decision Making: moderate    Prognosis: good prognosis    Positive Prognosis Rationale: motivated for treatment and response to trial tx.      Therapy Goals      Goals:      GOALS:  Short-term Goals to be achieved in 3 weeks. Patient will:  1. Independent with home exercise program for continued self treatment.  2. Improve L knee AROM by 15 degrees for improved community mobility.  3. Improve LLE strength by ?? muscle grade for improved community mobility.  4. Improve WOMAC Functional Scale by 10pts.  5. Improve L SLS > 15 SECONDS or better for community ambulation.         Long-term Goals to be achieved in 8 weeks. Patient will:  1. Have LLE VMO strength 4+/5 or greater for normal community ambulation.  2. Ambulate greater than 1039ft for normal community ambulation painfree.  3. Ambulate 1 full flight of stairs at and independent level with reciprocal gait.  4. Improve WOMAC Functional Scale 20pts.  5. Improve squatting ability WFL and painfree.      Plan    Therapy options: will be seen for skilled physical therapy services    Planned therapy interventions: 97140-Manual Therapy, 97530-Therapeutic Activities, 97750-Physical Performance Test, 97116-Gait Training, 97110-Therapeutic Exercises, 97112-Neuromuscular Re-education, 97032, G0283-Electrical Stimulation (unattended, attended), 97016-Pneumatic Devices (compression pump) and 97010-Cold Packs/Hot Packs      Frequency: 2x week    Duration in weeks: 8    Education provided to: patient.    Education provided: HEP, Fall prevention and Symptom management    Education results: needs reinforcement and verbalized good understanding.    Communication/Consultation: Discussed with PTA and Initial note sent to Referring Provider.      Total Session Time: 60    Treatment rendered today:      Therapeutic Treatment  DOS 05/23/23  TE: 30 min.  -Nustep level 5 x 12 min (seat begun at #6 and moved to #4 half way  through)  -Standing step stretch for flexion 4 x 10 sec  -Standing step stretch for extension 4 x 10 sec  -slant board stretch 4 x 20 sec    -standing heel raises x 20  -Standing toe raises x 20  -standing hip abd, hip extenion x 20 each  -Standing marching with B UE support x 20  -Standing knee flexion x 20  -Supine heel slides with foot on wall for AAROM x 4 min continuous  -Supine heels on 55 cm theraball, quad sets with hold x 5 sec continuous x 5 min.  -Supine heels on 55 cm theraball, B knee flexion with 5 sec hold for continuous x 5 min.    TA: 22 min.  -Step through gait cycle with SPC for proper heel strike, toe off x 120 feet  -Step ups on 6 inch step with B UE support x 20  -Step downs off 4 inch step with B UE support x 20  -Sit to stands off airex in chair with B UE support x 10  -Gait training: heel strike, toe off with proper sequencing and step through cycle with FWW, 2 x 40 feet and 6 laps in parallal bars    Manual 9 min.  Patient seated, PA/AP joint mobilzations with small oscillations x 2 min. each  Patient supine, PROM flexion and extension per tolerance with 5 sec hold x 10 reps each      Modalities 15 min.  Vasopneumatic compression and cold at 40 degrees, 5-15 mm Hg x 15 min elevated.    Plan details: Modalities (heat, ice, ultrasound, electrical stimulation, iontophoresis), as needed for pain management. Massage, mobilization and manual therapy as needed to increase ROM in restricted soft tissues and joints. Therapeutic exercise, Neuromuscular Re-education, and education.      Subjective:     History of Present Condition     Date of surgery:  05/23/2023    History of Present Condition/Chief Complaint:  [name] who presents to PT s/p Left knee replacement. Patient presents WBAT with FWW, dressings intact. Patient with history of prior Right TKA.  Subjective:  Patient reports she took off dressing and changed dressing per MD orders.     Quality of life:  Good  Pain:     Current pain rating:  6    At best pain rating:  6    At worst pain rating:  10  Location:  Circumferentially left knee    Quality:  Aching, tender and stiffness    Relieving factors:  Ice and rest    Aggravating factors:  As the day progresses, bending and performance of leg dominant activites  Precautions/Equipment   Precautions:  Weight bearing as tolerated    Current Braces/Orthoses:  None    Equipment Currently Used:  Rolling walker  Prior Functional Status     Physical limitation(s):  Patient reports limitations of 70% per [facility]  Current Functional Status:    disturbed sleep, limited bending, limited household activities, unsteady gait, limited walking tolerance and limited standing tolerance  Social Support:     Hand dominance:  Right    Communication Preference:  Verbal, written and visual  Treatments:     Previous treatment:  Surgery  Patient Goals:     Patient/Family goals for therapy:  Improved balance, improved ambulation, increased ROM and increased strength      Objective:       Range of Motion  Knee  Active ROM  Left Knee-Active  ROM  Flexion: 70 degrees   Extension: 8 degrees   Passive ROM  Left Knee-Passive ROM    Flexion: 75 degrees   Extension: 0 degrees     Strength  Left Hip   Planes of Motion   Flexion: 4+  Extension: 4+  Abduction: 4  Adduction: 4+  Left Knee   Flexion: 4  Extension: 4-  Additional Strength Details   VMO R=5/5, L=3/5,      Tests   Additional Tests Details:     WOMAC 68/96=70% impairment                                  I attest that I have reviewed the above information.  Signed: Linna Darner, PT  06/07/2023 6:39 AM

## 2023-06-13 MED FILL — OZEMPIC 2 MG/DOSE (8 MG/3 ML) SUBCUTANEOUS PEN INJECTOR: SUBCUTANEOUS | 28 days supply | Qty: 3 | Fill #3

## 2023-06-13 NOTE — Unmapped (Signed)
 Stacy Fox requested a refill of their Ozempic via IVR/Web. [facility] has scheduled delivery per the patients request via UPS to be delivered to their prescription address on 06/14/23.

## 2023-06-13 NOTE — Unmapped (Signed)
 [facility] PHYSICAL THERAPY [place]  OUTPATIENT PHYSICAL THERAPY  06/12/2023  Note Type: Treatment Note       Patient Name: Stacy Fox  Date of Birth:[birth year]  Diagnosis:   Encounter Diagnoses   Name Primary?    Osteoarthritis of left knee, unspecified osteoarthritis type Yes    Aftercare following left knee joint replacement surgery      Referring MD:  Charlestine Massed, PA     Date of Onset of Impairment-No date available  Date PT Care Plan Established or Reviewed-05/29/2023  Date PT Treatment Started-No date available   Visit Count: 5  Plan of Care Effective Date: 05/28/2023 - 07/28/2023     Visit 5/16 Reassessment due 06/28/23    Assessment/Plan:    Assessment  Assessment details:    Patient able to achieve 90 degrees in flexion today in AAROM during manual stretch. Patient progressing well per protocol with additional activities of gait mechanics training, weight bearing functional strengthening with noted AAROM of knee flexion in weight bearing. Continued with functional activities with single UE support in parallal bars today. Ambulated into clinic with [facility] today.    Patient will benefit from skilled PT intervention to address current body structure impairments and activity limitations to return to functions of community and household mobility.           Impairments: decreased range of motion, gait deviation, impaired flexibility, decreased mobility, decreased strength, fall risk, impaired balance and pain          Examination of Body Systems: musculoskeletal    Clinical Presentation: stable    Clinical Decision Making: moderate    Prognosis: good prognosis    Positive Prognosis Rationale: motivated for treatment and response to trial tx.      Therapy Goals      Goals:      GOALS:  Short-term Goals to be achieved in 3 weeks. Patient will:  1. Independent with home exercise program for continued self treatment.  2. Improve L knee AROM by 15 degrees for improved community mobility.  3. Improve LLE strength by ?? muscle grade for improved community mobility.  4. Improve WOMAC Functional Scale by 10pts.  5. Improve L SLS > 15 SECONDS or better for community ambulation.         Long-term Goals to be achieved in 8 weeks. Patient will:  1. Have LLE VMO strength 4+/5 or greater for normal community ambulation.  2. Ambulate greater than 1021ft for normal community ambulation painfree.  3. Ambulate 1 full flight of stairs at and independent level with reciprocal gait.  4. Improve WOMAC Functional Scale 20pts.  5. Improve squatting ability WFL and painfree.      Plan    Therapy options: will be seen for skilled physical therapy services    Planned therapy interventions: 97140-Manual Therapy, 97530-Therapeutic Activities, 97750-Physical Performance Test, 97116-Gait Training, 97110-Therapeutic Exercises, 97112-Neuromuscular Re-education, 97032, G0283-Electrical Stimulation (unattended, attended), 97016-Pneumatic Devices (compression pump) and 97010-Cold Packs/Hot Packs      Frequency: 2x week    Duration in weeks: 8    Education provided to: patient.    Education provided: HEP, Fall prevention and Symptom management    Education results: needs reinforcement and verbalized good understanding.    Communication/Consultation: Discussed with PTA and Initial note sent to Referring Provider.      Total Session Time: 60    Treatment rendered today:      Therapeutic Treatment  DOS 05/23/23  TE: 30 min.  -Nustep level 5 x  12 min (seat begun at #5 and moved to #3 half way through)  -Standing step stretch for flexion 4 x 10 sec  -Standing step stretch for extension 4 x 10 sec  -slant board stretch 4 x 20 sec    -standing hip abd, hip extenion x 20 each  -Standing marching with B UE support x 20  -Standing knee flexion x 20  -Supine heel slides with foot on wall for AAROM x 4 min continuous  -Supine heels on 55 cm theraball, quad sets with hold x 5 sec continuous x 5 min.  -Supine heels on 55 cm theraball, B knee flexion with 5 sec hold for continuous x 5 min.    TA: 22 min.  -Dynamic gait activities: Step through gait cycle with two PVC poles for support for proper heel strike, toe off x 120 feet, Toe walks, heel walks, high knees, butt kicks, tin soldier, tandem stepping each x 30 feet    -Step ups on 6 inch step with B UE support x 20  -Step downs off 4 inch step with B UE support x 20  -Sit to stands off airex in chair with B UE support x 10  -BOSU lunge and hold, 20 x 5 sec    Manual 9 min.  Patient seated, PA/AP joint mobilzations with small oscillations x 2 min. each  Patient supine, PROM flexion and extension per tolerance with 5 sec hold x 10 reps each      Modalities 15 min.  Vasopneumatic compression and cold at 40 degrees, 5-15 mm Hg x 15 min elevated.    Plan details: Modalities (heat, ice, ultrasound, electrical stimulation, iontophoresis), as needed for pain management. Massage, mobilization and manual therapy as needed to increase ROM in restricted soft tissues and joints. Therapeutic exercise, Neuromuscular Re-education, and education.      Subjective:     History of Present Condition     Date of surgery:  05/23/2023    History of Present Condition/Chief Complaint:  [name] who presents to PT s/p Left knee replacement. Patient presents WBAT with FWW, dressings intact. Patient with history of prior Right TKA.  Subjective:  Patient reports compliance with updated HEP to promote knee flexion over weekend     Quality of life:  Good  Pain:     Current pain rating:  6    At best pain rating:  6    At worst pain rating:  10  Location:  Circumferentially left knee    Quality:  Aching, tender and stiffness    Relieving factors:  Ice and rest    Aggravating factors:  As the day progresses, bending and performance of leg dominant activites  Precautions/Equipment   Precautions:  Weight bearing as tolerated    Current Braces/Orthoses:  None    Equipment Currently Used:  Rolling walker  Prior Functional Status     Physical limitation(s):  Patient reports limitations of 70% per WOMAC  Current Functional Status:    disturbed sleep, limited bending, limited household activities, unsteady gait, limited walking tolerance and limited standing tolerance  Social Support:     Hand dominance:  Right    Communication Preference:  Verbal, written and visual  Treatments:     Previous treatment:  Surgery  Patient Goals:     Patient/Family goals for therapy:  Improved balance, improved ambulation, increased ROM and increased strength      Objective:       Range of Motion  Knee  Active ROM  Left Knee-Active ROM  Flexion: 70 degrees   Extension: 8 degrees   Passive ROM  Left Knee-Passive ROM    Flexion: 75 degrees   Extension: 0 degrees     Strength  Left Hip   Planes of Motion   Flexion: 4+  Extension: 4+  Abduction: 4  Adduction: 4+  Left Knee   Flexion: 4  Extension: 4-  Additional Strength Details   VMO R=5/5, L=3/5,      Tests   Additional Tests Details:     WOMAC 68/96=70% impairment                                  I attest that I have reviewed the above information.  Signed: Linna Darner, PT  06/12/2023 6:53 AM

## 2023-06-13 NOTE — Unmapped (Signed)
 EGD  Procedure #1     Procedure #2   [id number]  MRN     Endoscopist     Is the patient's health insurance [employer], [employer], [facility]), [employer] [place], [employer], or [place]?     Urgent procedure     Are you pregnant?     Are you in the process of scheduling or awaiting results of a heart ultrasound, stress test, or catheterization to evaluate new or worsening chest pain, dizziness, or shortness of breath?     Do you take: Plavix (clopidogrel), Coumadin (warfarin), Lovenox (enoxaparin), Pradaxa (dabigatran), Effient (prasugrel), Xarelto (rivaroxaban), Eliquis (apixaban), Pletal (cilostazol), or Brilinta (ticagrelor)?          Did ordering provider indicate how long to hold this medication in the order comments?          Which of the above medications are you taking?          What is the name of the medical practice that manages this medication?          What is the name of the medical provider who manages this medication?     Do you have hemophilia, von Willebrand disease, or low platelets?     Do you have a pacemaker or implanted cardiac defibrillator?     Has a [facility] provider specified the location(s)?     Which location(s) did the [facility] provider specify?          [facility]          [place]          [facility]   TRUE  Do you see a liver specialist for chronic liver disease?     Is the procedure indication for variceal screening?     Is procedure indication for variceal banding (this does NOT include variceal screening)?     Have you had a heart attack, stroke or heart stent placement within the past 6 months?     Month of event     Year of event (ONLY ENTER LAST 2 DIGITS)        4  Height (feet)   11  Height (inches)   191  Weight (pounds)   38.6  BMI          Did the ordering provider specify a bowel prep?          What bowel prep was specified?     Do you have an ostomy (bag on your stomach that collects your stool)?          Is it an ileostomy?          Is it a colostomy? Patient doesn't know.     Do you have chronic kidney disease?     Do you have chronic constipation or have you had poor quality bowel preps for past colonoscopies?     Do you have Crohn's disease or ulcerative colitis?     Have you had weight loss surgery?          When you walk around your house or grocery store, do you have to stop and rest due to shortness of breath, chest pain, or light-headedness?     Do you ever use supplemental oxygen?     Have you been hospitalized for cirrhosis of the liver or heart failure in the last 12 months?     Have you been treated for mouth or throat cancer with radiation or surgery?  Have you been told that it is difficult for doctors to insert a breathing tube in you during anesthesia?     Have you had a heart or lung transplant?          Are you on dialysis?     Do you have cirrhosis of the liver?     Do you have myasthenia gravis?     Is the patient a prisoner?   ################# ## ###################################################################################################################   MRN:  [id number]   Anticoag Review No   Nurse Triage  No   GI clinic consult No   Procedure(s):  EGD     0   Endoscopist:  0   Urgent:  No   Prep:                    --------------------------- --- ----------------------------------------------------------------------------------------------------------------------------------------------------------------------------   G3 Locations:  [facility]     [facility]     [place]        Requested Locations:              ################# ## ###################################################################################################################

## 2023-06-13 NOTE — Unmapped (Signed)
 Provider has requested to reschedule appointment Dr Ruffin Frederick 10/16/23. Call patient to make aware of the changes. Patient has confirmed and agree to the new date and time  Dr Ruffin Frederick 11/20/23 at 1100 am

## 2023-06-14 DIAGNOSIS — Z96652 Presence of left artificial knee joint: Principal | ICD-10-CM

## 2023-06-14 DIAGNOSIS — Z471 Aftercare following joint replacement surgery: Principal | ICD-10-CM

## 2023-06-14 DIAGNOSIS — M1712 Unilateral primary osteoarthritis, left knee: Principal | ICD-10-CM

## 2023-06-14 NOTE — Unmapped (Signed)
 Stacy Fox PHYSICAL THERAPY [place]  OUTPATIENT PHYSICAL THERAPY  06/14/2023  Note Type: Treatment Note       Patient Name: Stacy Fox  Date of Birth:[birth year]  Diagnosis:   Encounter Diagnoses   Name Primary?    Osteoarthritis of left knee, unspecified osteoarthritis type Yes    Aftercare following left knee joint replacement surgery      Referring MD:  Charlestine Massed, PA     Date of Onset of Impairment-No date available  Date PT Care Plan Established or Reviewed-05/29/2023  Date PT Treatment Started-No date available   Visit Count: 6  Plan of Care Effective Date: 05/28/2023 - 07/28/2023     Visit 6/16 Reassessment due 06/28/23    Assessment/Plan:    Assessment  Assessment details:    Patient responded well to PT treatment and reported reduced left knee pain from 8/10 to 6/10 on numeric pain scale. Patient able to achieve 93 degrees in flexion today in AAROM during manual stretch. Patient continues to progress well per protocol with additional activities of weight bearing functional strengthening with noted AAROM of knee flexion in weight bearing. Continued with functional activities with single UE support in parallal bars today. Ambulated into clinic with [facility] today.    Patient will benefit from skilled PT intervention to address current body structure impairments and activity limitations to return to functions of community and household mobility.           Impairments: decreased range of motion, gait deviation, impaired flexibility, decreased mobility, decreased strength, fall risk, impaired balance and pain          Examination of Body Systems: musculoskeletal    Clinical Presentation: stable    Clinical Decision Making: moderate    Prognosis: good prognosis    Positive Prognosis Rationale: motivated for treatment and response to trial tx.      Therapy Goals      Goals:      GOALS:  Short-term Goals to be achieved in 3 weeks. Patient will:  1. Independent with home exercise program for continued self treatment.  2. Improve L knee AROM by 15 degrees for improved community mobility.  3. Improve LLE strength by ?? muscle grade for improved community mobility.  4. Improve WOMAC Functional Scale by 10pts.  5. Improve L SLS > 15 SECONDS or better for community ambulation.         Long-term Goals to be achieved in 8 weeks. Patient will:  1. Have LLE VMO strength 4+/5 or greater for normal community ambulation.  2. Ambulate greater than 1028ft for normal community ambulation painfree.  3. Ambulate 1 full flight of stairs at and independent level with reciprocal gait.  4. Improve WOMAC Functional Scale 20pts.  5. Improve squatting ability WFL and painfree.      Plan    Therapy options: will be seen for skilled physical therapy services    Planned therapy interventions: 97140-Manual Therapy, 97530-Therapeutic Activities, 97750-Physical Performance Test, 97116-Gait Training, 97110-Therapeutic Exercises, 97112-Neuromuscular Re-education, 97032, G0283-Electrical Stimulation (unattended, attended), 97016-Pneumatic Devices (compression pump) and 97010-Cold Packs/Hot Packs      Frequency: 2x week    Duration in weeks: 8    Education provided to: patient.    Education provided: HEP, Fall prevention and Symptom management    Education results: needs reinforcement and verbalized good understanding.    Communication/Consultation: Discussed with PTA and Initial note sent to Referring Provider.      Total Session Time: 70    Treatment rendered today:  Therapeutic Treatment  DOS 05/23/23  TE: 29 min.  -Nustep level 3 x 12 min (seat begun at #5 and moved to #3 half way through)  -Standing step stretch for flexion 4 x 30 sec  -Standing step stretch for extension 3 x 30 sec  -slant board stretch 3 x 30 sec  -BOSU lunge and hold, 20 x 5 second holds  -standing hip abd, hip extenion x 20 each BLEs  -Supine heel slides with mat tablel for AAROM 5 x 30 second holds      97110 TE: Pt. was progressed to tolerance with left knee strengthening & stretching exercises as above to increase pt.'s ability to meet functional goals. Pt. required demonstration & min. to mod. VCs & tactile cues for proper exercise technique.    TA: 22 min.    -Step downs off 4 inch step with B UE support 2 x 10 repetitions  -Step ups on 6 inch step with B UE support 2 x 10 repetitions  -Sit to stands off airex in chair with B UE support 2 x 10    Patient required supervision to SBA due to decreased balance and moderate verbal cues and demonstration for proper technique.     Manual 9 min.  Patient seated, PA/AP joint mobilzations with small oscillations x 2 min. each  Patient supine, PROM flexion and extension per tolerance with 5 sec hold x 10 reps each      Modalities 10 min.  PT applied Vasopneumatic compression and cold at 45 degrees, 5-15 mm Hg x 10 min elevated.      Not given today:  -Dynamic gait activities: Step through gait cycle with two PVC poles for support for proper heel strike, toe off x 120 feet, Toe walks, heel walks, high knees, butt kicks, tin soldier, tandem stepping each x 30 feet  -Standing marching with B UE support x 20  -Standing knee flexion x 20  -Supine heels on 55 cm theraball, quad sets with hold x 5 sec continuous x 5 min.  -Supine heels on 55 cm theraball, B knee flexion with 5 sec hold for continuous x 5 min.    Plan details: Modalities (heat, ice, ultrasound, electrical stimulation, iontophoresis), as needed for pain management. Massage, mobilization and manual therapy as needed to increase ROM in restricted soft tissues and joints. Therapeutic exercise, Neuromuscular Re-education, and education.      Subjective:     History of Present Condition     Date of surgery:  05/23/2023    History of Present Condition/Chief Complaint:  [name] who presents to PT s/p Left knee replacement. Patient presents WBAT with FWW, dressings intact. Patient with history of prior Right TKA.  Subjective:  Patient reported increased pain in her knee beginning yesterday and reported no significant pain after previous treatment. Patient reported 8/10 pain upon presentation today.      Quality of life:  Good  Pain:     Current pain rating:  8    At best pain rating:  6    At worst pain rating:  10  Location:  Circumferentially left knee    Quality:  Aching, tender and stiffness    Relieving factors:  Ice and rest    Aggravating factors:  As the day progresses, bending and performance of leg dominant activites  Precautions/Equipment   Precautions:  Weight bearing as tolerated    Current Braces/Orthoses:  None    Equipment Currently Used:  Rolling walker  Prior Functional Status     Physical limitation(s):  Patient reports limitations of 70% per [facility]  Current Functional Status:    disturbed sleep, limited bending, limited household activities, unsteady gait, limited walking tolerance and limited standing tolerance  Social Support:     Hand dominance:  Right    Communication Preference:  Verbal, written and visual  Treatments:     Previous treatment:  Surgery  Patient Goals:     Patient/Family goals for therapy:  Improved balance, improved ambulation, increased ROM and increased strength      Objective:       Range of Motion  Knee  Active ROM  Left Knee-Active ROM  Flexion: 70 degrees   Extension: 8 degrees   Passive ROM  Left Knee-Passive ROM    Flexion: 75 degrees   Extension: 0 degrees     Strength  Left Hip   Planes of Motion   Flexion: 4+  Extension: 4+  Abduction: 4  Adduction: 4+  Left Knee   Flexion: 4  Extension: 4-  Additional Strength Details   VMO R=5/5, L=3/5,      Tests   Additional Tests Details:     WOMAC 68/96=70% impairment                                  I attest that I have reviewed the above information.  Signed: Shelly Coss, PT  06/14/2023 12:49 PM

## 2023-06-19 NOTE — Unmapped (Signed)
 Hepatology Clinic Pharmacist Note    Primary Hepatology provider: Dr. King Penning  Diagnosis: MASH  Fibrosis: Cirrhosis (Nodular liver seen at [facility])    Ozempic restart date: 11/28/21  Current regimen: Ozempic 2mg  weekly since 02/20/22  PA renewed and approved til 06/07/24    Pharmacy: [facility] Pharmacy [phone] option #4, then option 4    Reports injecting Ozempic 2mg  on Mondays on abdomen. Rotates sites. Held 1 dose prior to her knee replacement surgery in March.     Patient Reports:   Adverse effects: denies  Early satiety: Yes  Increased satiation: Yes    Other Weight-Related Considerations:   Major changes to diet? Denies but husband is seeing nutritionist at weight loss clinic  Major changes to physical activity? Recent knee surgery. Still some pain but able to walk some.   Changes in energy: Denies   Major changes to sleep? Denies, falls asleep 7pm on counch, wakes up around 1am then stays up til 5am then sleeps another hour at 6am.  Major changes to mental health? Denies       Weight Management Progress:  Goal weight: 89.4kg (196.6 lbs) - initial goal met; New goal=80.5kg (177 lbs)  Initial weight: 99.3 kg (219 lb)  as of 08/03/2021  Last weight:  97.4 kg (214 lb 12.8 oz) as of 11/28/21   212 lbs per pt report on 12/23/21  94.1kg (207 lb 8oz) on 02/15/22 at appt with Dr. Lydia Sams  90.7 kg (200 lb)  as of 04/04/2022 at appt with Dr. Lydia Sams  197 lbs per pt report on 05/22/22  89.3 kg (196 lb 14.4 oz) 10/03/22  87.7 kg (193 lb 4.8 oz)  12/05/22  Current weight:  192 lbs per pt report on 06/19/23  Percentage weight loss: 11.7% (11.6 kg, 25.5 lbs)   Since initial visit:     Pt reports she's tolerating 2mg  with no issues. She is continuing to see weight loss benefit on Ozempic. Will continue current dose. Discussed the importance of medication adherence, lifestyle modifications including good sleep hygiene, and consistency to meet weight management goals.      Reviewed to continue to monitor blood sugar.  Reviewed signs/symptoms of hypoglycemia and management strategies in case of hypoglycemia. Reminded pt of her upcoming EGD on 08/02/23 and follow up appt scheduled with Dr. King Penning on 9/9//25. Pt verbalized understanding. Instructed hold 1 dose prior to EGD. Pt verbalized understanding. Will continue to follow 3-6 months.    Clarence Croak, Pharm D., BCPS, BCGP, CPP  [facility]  [address]  [place], [place] [address]  [phone]

## 2023-06-20 NOTE — Unmapped (Signed)
 [facility] PHYSICAL [facility]ERAP[facility] [place]  OUTPATIENT PHYSICAL THERAPY  06/19/2023  Note Type: Treatment Note       Patient Name: Stacy Fox  Date of Birth:March 08, 1961  Diagnosis:   Encounter Diagnoses   Name Primary?    Osteoarthritis of left knee, unspecified osteoarthritis type Yes    Aftercare following left knee joint replacement surgery      Referring MD:  Recardo Canal, PA     Date of Onset of Impairment-No date available  Date PT Care Plan Established or Reviewed-05/29/2023  Date PT Treatment Started-No date available   Visit Count: 7  Plan of Care Effective Date: 05/28/2023 - 07/28/2023     Visit 7/16 Reassessment due 06/28/23    Assessment/Plan:    Assessment  Assessment details:    Patient responded well to PT treatment with noted improvement in ROM to 98 degrees in sitting. Concerned regarding patient incision after steristrips independently fell off. Replaced three additional steristrips on incision that was superficially gapped.     Patient continues to progress well per protocol with additional activities of weight bearing functional strengthening with noted AAROM and AROM of knee flexion in weight bearing. Continued with functional activities with single UE support in parallal bars today. Ambulated into clinic with [facility] today.    Patient will benefit from skilled PT intervention to address current body structure impairments and activity limitations to return to functions of community and household mobility.           Impairments: decreased range of motion, gait deviation, impaired flexibility, decreased mobility, decreased strength, fall risk, impaired balance and pain          Examination of Body Systems: musculoskeletal    Clinical Presentation: stable    Clinical Decision Making: moderate    Prognosis: good prognosis    Positive Prognosis Rationale: motivated for treatment and response to trial tx.      Therapy Goals      Goals:      GOALS:  Short-term Goals to be achieved in 3 weeks. Patient will:  1. Independent with home exercise program for continued self treatment.  2. Improve L knee AROM by 15 degrees for improved community mobility.  3. Improve LLE strength by ?? muscle grade for improved community mobility.  4. Improve WOMAC Functional Scale by 10pts.  5. Improve L SLS > 15 SECONDS or better for community ambulation.         Long-term Goals to be achieved in 8 weeks. Patient will:  1. Have LLE VMO strength 4+/5 or greater for normal community ambulation.  2. Ambulate greater than 1053ft for normal community ambulation painfree.  3. Ambulate 1 full flight of stairs at and independent level with reciprocal gait.  4. Improve WOMAC Functional Scale 20pts.  5. Improve squatting ability WFL and painfree.      Plan    Therapy options: will be seen for skilled physical therapy services    Planned therapy interventions: 97140-Manual Therapy, 97530-Therapeutic Activities, 97750-Physical Performance Test, 97116-Gait Training, 97110-Therapeutic Exercises, 97112-Neuromuscular Re-education, 97032, G0283-Electrical Stimulation (unattended, attended), 97016-Pneumatic Devices (compression pump) and 97010-Cold Packs/Hot Packs      Frequency: 2x week    Duration in weeks: 8    Education provided to: patient.    Education provided: HEP, Fall prevention and Symptom management    Education results: needs reinforcement and verbalized good understanding.    Communication/Consultation: Discussed with PTA and Initial note sent to Referring Provider.      Total Session Time:  70    Treatment rendered today:      Therapeutic Treatment  DOS 05/23/23 ROM flex: 98 degrees, Ext: 8      TE: 29 min.  -Nustep level 5 x 12 min (seat begun at #4 and moved to #2 half way through)  -Standing step stretch for flexion 4 x 30 sec  -Standing step stretch for extension 3 x 30 sec  -slant board stretch 3 x 30 sec  -BOSU lunge and hold, 20 x 5 second holds  -standing hip abd, hip extenion x 20 each BLEs  -Prone hangs x 4 min.  -Prone knee flexion with use of sheet for overpressure into flexion with 5 sec holds x .  -Supine heels on 55 cm theraball, quad sets with hold x 5 sec continuous x 5 min.  -Supine heels on 55 cm theraball, B knee flexion with 5 sec hold for continuous x 5 min.    TA: 22 min.  -Dynamic gait activities: Step through gait cycle with single cane for support for proper heel strike, toe off x 120 feet, Toe walks, heel walks, high knees, butt kicks, tin soldier, tandem stepping, sumo squat each x 30 feet  -Step downs off 6 inch step with B UE support 2 x 10 repetitions  -Step ups on 6 inch step with B UE support 2 x 10 repetitions  -Sit to stands off chair with B UE support 2 x 10    Patient required supervision to SBA due to decreased balance and moderate verbal cues and demonstration for proper technique.     Manual 9 min.  Patient seated, PA/AP joint mobilzations with small oscillations x 2 min. each  Patient supine, PROM flexion and extension per tolerance with 5 sec hold x 10 reps each    NOT performed today:  Modalities 10 min.  PT applied Vasopneumatic compression and cold at 45 degrees, 5-15 mm Hg x 10 min elevated.        Plan details: Modalities (heat, ice, ultrasound, electrical stimulation, iontophoresis), as needed for pain management. Massage, mobilization and manual therapy as needed to increase ROM in restricted soft tissues and joints. Therapeutic exercise, Neuromuscular Re-education, and education.      Subjective:     History of Present Condition     Date of surgery:  05/23/2023    History of Present Condition/Chief Complaint:  [name] who presents to PT s/p Left knee replacement. Patient presents WBAT with FWW, dressings intact. Patient with history of prior Right TKA.  Subjective:  Patient reported increased pain in her knee beginning yesterday and reported no significant pain after previous treatment. Patient reported 8/10 pain upon presentation today.      Quality of life:  Good  Pain:     Current pain rating:  8    At best pain rating:  6    At worst pain rating:  10  Location:  Circumferentially left knee    Quality:  Aching, tender and stiffness    Relieving factors:  Ice and rest    Aggravating factors:  As the day progresses, bending and performance of leg dominant activites  Precautions/Equipment   Precautions:  Weight bearing as tolerated    Current Braces/Orthoses:  None    Equipment Currently Used:  Rolling walker  Prior Functional Status     Physical limitation(s):  Patient reports limitations of 70% per [facility]  Current Functional Status:    disturbed sleep, limited bending, limited household activities,  unsteady gait, limited walking tolerance and limited standing tolerance  Social Support:     Hand dominance:  Right    Communication Preference:  Verbal, written and visual  Treatments:     Previous treatment:  Surgery  Patient Goals:     Patient/Family goals for therapy:  Improved balance, improved ambulation, increased ROM and increased strength      Objective:       Range of Motion  Knee  Active ROM  Left Knee-Active ROM  Flexion: 70 degrees   Extension: 8 degrees   Passive ROM  Left Knee-Passive ROM    Flexion: 75 degrees   Extension: 0 degrees     Strength  Left Hip   Planes of Motion   Flexion: 4+  Extension: 4+  Abduction: 4  Adduction: 4+  Left Knee   Flexion: 4  Extension: 4-  Additional Strength Details   VMO R=5/5, L=3/5,      Tests   Additional Tests Details:     WOMAC 68/96=70% impairment                                  I attest that I have reviewed the above information.  Signed: Marcianne Settler, PT  06/19/2023 6:16 AM

## 2023-06-21 DIAGNOSIS — Z471 Aftercare following joint replacement surgery: Principal | ICD-10-CM

## 2023-06-21 DIAGNOSIS — M1712 Unilateral primary osteoarthritis, left knee: Principal | ICD-10-CM

## 2023-06-21 DIAGNOSIS — Z96652 Presence of left artificial knee joint: Principal | ICD-10-CM

## 2023-06-21 NOTE — Unmapped (Signed)
 [facility] PHYSICAL [facility]ER[facility] [place]  OUTPATIENT PHYSICAL THERAPY  06/21/2023  Note Type: Treatment Note       Patient Name: Stacy Fox  Date of Birth:February 18, 1961  Diagnosis:   Encounter Diagnoses   Name Primary?    Osteoarthritis of left knee, unspecified osteoarthritis type Yes    Aftercare following left knee joint replacement surgery      Referring MD:  Recardo Canal, PA     Date of Onset of Impairment-No date available  Date PT Care Plan Established or Reviewed-05/29/2023  Date PT Treatment Started-No date available   Visit Count: 8  Plan of Care Effective Date: 05/28/2023 - 07/28/2023     Visit 8/16 Reassessment due 06/28/23    Assessment/Plan:    Assessment  Assessment details:    Patient responded well to PT treatment with noted improvement in ROM to 100 degrees in sitting. Patient reported reduced pain after treatment session today. Will continue to progress as per protocol with functional stretching and strengthening exercises as appropriate.       Patient will benefit from skilled PT intervention to address current body structure impairments and activity limitations to return to functions of community and household mobility.           Impairments: decreased range of motion, gait deviation, impaired flexibility, decreased mobility, decreased strength, fall risk, impaired balance and pain          Examination of Body Systems: musculoskeletal    Clinical Presentation: stable    Clinical Decision Making: moderate    Prognosis: good prognosis    Positive Prognosis Rationale: motivated for treatment and response to trial tx.      Therapy Goals      Goals:      GOALS:  Short-term Goals to be achieved in 3 weeks. Patient will:  1. Independent with home exercise program for continued self treatment.  2. Improve L knee AROM by 15 degrees for improved community mobility.  3. Improve LLE strength by ?? muscle grade for improved community mobility.  4. Improve WOMAC Functional Scale by 10pts.  5. Improve L SLS > 15 SECONDS or better for community ambulation.         Long-term Goals to be achieved in 8 weeks. Patient will:  1. Have LLE VMO strength 4+/5 or greater for normal community ambulation.  2. Ambulate greater than 1021ft for normal community ambulation painfree.  3. Ambulate 1 full flight of stairs at and independent level with reciprocal gait.  4. Improve WOMAC Functional Scale 20pts.  5. Improve squatting ability WFL and painfree.      Plan    Therapy options: will be seen for skilled physical therapy services    Planned therapy interventions: 97140-Manual Therapy, 97530-Therapeutic Activities, 97750-Physical Performance Test, 97116-Gait Training, 97110-Therapeutic Exercises, 97112-Neuromuscular Re-education, 97032, G0283-Electrical Stimulation (unattended, attended), 97016-Pneumatic Devices (compression pump) and 97010-Cold Packs/Hot Packs      Frequency: 2x week    Duration in weeks: 8    Education provided to: patient.    Education provided: HEP, Fall prevention and Symptom management    Education results: needs reinforcement and verbalized good understanding.    Communication/Consultation: Discussed with PTA and Initial note sent to Referring Provider.      Total Session Time: 60    Treatment rendered today:      Therapeutic Treatment  DOS 05/23/23     ROM flex: 100 degrees in sitting, Ext: 5      TE: 34 min.  -Recumbent Bike ROM  only x8 minutes  -Standing step stretch for flexion 10 x 10 sec  -Standing step stretch for extension 10 x 10 sec  -slant board stretch 3 x 30 sec  -BOSU lunge and hold, 20 x 5 second holds RUE only   TKE with hip machine 20# 2 x 10 repetitions  -standing hip flexion, abduction, extension x 20 each BLEs  -Seated at mat table L knee flexion AAROM 10 x 10 holds  -Supine heel slides 3 x 30 holds  -Prone hangs x 3 min.      TA: 22 min.  -Dynamic gait activities: Forward and retro gait on 6 foot ramp x 10 repetitions without use of cane  -Step downs off 6 inch step with B UE support 2 x 10 repetitions  -Step ups on 6 inch step forward and lateral with B UE support 2 x 10 repetitions each way  -Sit to stands off chair with foam pads without UE support 2 x 10 reps    Patient required supervision to SBA due to decreased balance and moderate verbal cues and demonstration for proper technique.     Manual 4 min.  Patient supine, PA/AP joint mobilzations with small oscillations x 2 min. each      Patient declined modalities today and stated that she would ice at home.     NOT performed today:  Modalities 10 min.  PT applied Vasopneumatic compression and cold at 45 degrees, 5-15 mm Hg x 10 min elevated.    -Prone knee flexion with use of sheet for overpressure into flexion with 5 sec holds x .  -Supine heels on 55 cm theraball, quad sets with hold x 5 sec continuous x 5 min.  -Supine heels on 55 cm theraball, B knee flexion with 5 sec hold for continuous x 5 min.      Plan details: Modalities (heat, ice, ultrasound, electrical stimulation, iontophoresis), as needed for pain management. Massage, mobilization and manual therapy as needed to increase ROM in restricted soft tissues and joints. Therapeutic exercise, Neuromuscular Re-education, and education.      Subjective:     History of Present Condition     Date of surgery:  05/23/2023    History of Present Condition/Chief Complaint:  [name] who presents to PT s/p Left knee replacement. Patient presents WBAT with FWW, dressings intact. Patient with history of prior Right TKA.  Subjective:  Patient reported slightly decreased pain upon presentation today and reported 6/10 pain upon presentation.      Quality of life:  Good  Pain:     Current pain rating:  6    At best pain rating:  6    At worst pain rating:  10  Location:  Circumferentially left knee    Quality:  Aching, tender and stiffness    Relieving factors:  Ice and rest    Aggravating factors:  As the day progresses, bending and performance of leg dominant activites  Precautions/Equipment   Precautions:  Weight bearing as tolerated    Current Braces/Orthoses:  None    Equipment Currently Used:  Rolling walker  Prior Functional Status     Physical limitation(s):  Patient reports limitations of 70% per [facility]  Current Functional Status:    disturbed sleep, limited bending, limited household activities, unsteady gait, limited walking tolerance and limited standing tolerance  Social Support:     Hand dominance:  Right    Communication Preference:  Verbal, written and visual  Treatments:  Previous treatment:  Surgery  Patient Goals:     Patient/Family goals for therapy:  Improved balance, improved ambulation, increased ROM and increased strength      Objective:       Range of Motion  Knee  Active ROM  Left Knee-Active ROM  Flexion: 70 degrees   Extension: 8 degrees   Passive ROM  Left Knee-Passive ROM    Flexion: 75 degrees   Extension: 0 degrees     Strength  Left Hip   Planes of Motion   Flexion: 4+  Extension: 4+  Abduction: 4  Adduction: 4+  Left Knee   Flexion: 4  Extension: 4-  Additional Strength Details   VMO R=5/5, L=3/5,      Tests   Additional Tests Details:     WOMAC 68/96=70% impairment                                  I attest that I have reviewed the above information.  Signed: Antonia Battiest, PT  06/21/2023 2:09 PM

## 2023-06-26 DIAGNOSIS — Z96652 Presence of left artificial knee joint: Principal | ICD-10-CM

## 2023-06-26 DIAGNOSIS — Z471 Aftercare following joint replacement surgery: Principal | ICD-10-CM

## 2023-06-26 DIAGNOSIS — M1712 Unilateral primary osteoarthritis, left knee: Principal | ICD-10-CM

## 2023-06-26 NOTE — Unmapped (Signed)
 [facility] PHYSICAL THERAPY [place]  OUTPATIENT PHYSICAL THERAPY  06/26/2023  Note Type: Treatment Note       Patient Name: Stacy Fox  Date of Birth:1960/03/31  Diagnosis:   Encounter Diagnoses   Name Primary?    Osteoarthritis of left knee, unspecified osteoarthritis type Yes    Aftercare following left knee joint replacement surgery      Referring MD:  Recardo Canal, PA     Date of Onset of Impairment-No date available  Date PT Care Plan Established or Reviewed-05/29/2023  Date PT Treatment Started-No date available   Visit Count: 9  Plan of Care Effective Date: 05/28/2023 - 07/28/2023     Visit 9/16 Reassessment due 06/28/23    Assessment/Plan:    Assessment  Assessment details:    Patient continues to demonstrate good response to PT, with progression of strengthening, proprioception, gait, and balance training with pt reporting no increase in pain. Will continue to progress as per protocol with functional stretching and strengthening exercises as appropriate.       Patient will benefit from skilled PT intervention to address current body structure impairments and activity limitations to return to functions of community and household mobility.           Impairments: decreased range of motion, gait deviation, impaired flexibility, decreased mobility, decreased strength, fall risk, impaired balance and pain          Examination of Body Systems: musculoskeletal    Clinical Presentation: stable    Clinical Decision Making: moderate    Prognosis: good prognosis    Positive Prognosis Rationale: motivated for treatment and response to trial tx.      Therapy Goals      Goals:      GOALS:  Short-term Goals to be achieved in 3 weeks. Patient will:  1. Independent with home exercise program for continued self treatment.  2. Improve L knee AROM by 15 degrees for improved community mobility.  3. Improve LLE strength by ?? muscle grade for improved community mobility.  4. Improve WOMAC Functional Scale by 10pts.  5. Improve L SLS > 15 SECONDS or better for community ambulation.         Long-term Goals to be achieved in 8 weeks. Patient will:  1. Have LLE VMO strength 4+/5 or greater for normal community ambulation.  2. Ambulate greater than 1025ft for normal community ambulation painfree.  3. Ambulate 1 full flight of stairs at and independent level with reciprocal gait.  4. Improve WOMAC Functional Scale 20pts.  5. Improve squatting ability WFL and painfree.      Plan    Therapy options: will be seen for skilled physical therapy services    Planned therapy interventions: 97140-Manual Therapy, 97530-Therapeutic Activities, 97750-Physical Performance Test, 97116-Gait Training, 97110-Therapeutic Exercises, 97112-Neuromuscular Re-education, 97032, G0283-Electrical Stimulation (unattended, attended), 97016-Pneumatic Devices (compression pump) and 97010-Cold Packs/Hot Packs      Frequency: 2x week    Duration in weeks: 8    Education provided to: patient.    Education provided: HEP, Fall prevention and Symptom management    Education results: needs reinforcement and verbalized good understanding.    Communication/Consultation: Discussed with PTA and Initial note sent to Referring Provider.      Total Session Time: 60    Treatment rendered today:      Therapeutic Treatment  DOS 05/23/23     ROM flex: 100 degrees in sitting, Ext: -5 AROM, 0 AAROM      TE: 33 min.  -  Recumbent Bike ROM only x8 minutes  -Standing step stretch for flexion 10 x 10 sec  -Standing step stretch for extension 10 x 10 sec  -slant board stretch 3 x 30 sec  -BOSU lunge and hold, 20 x 5 second holds RUE only   -Standing w BUE support, TKE RLE with hip machine 80# 2 x 10 repetitions  -standing w BUE support, hip flexion, abduction, extension w 30# resistance x 10 reps x 2 each BLEs  -Seated at mat table L knee flexion AAROM 10 x 10 holds  -Supine heel slides w strap 3 x 30 holds      TA: 26 min.  -Dynamic gait/balance activities without UE support, 30' each w SBA: High stepping, toe walking, heel walking, side stepping w mini squats, tin soldiers, semi tandem gt  -Step downs off 6 inch step in parallel bars with B UE support 2 x 10 repetitions  -Step ups on 6 inch step forward and lateral with B UE support 2 x 10 repetitions each way  -Sit to stands from standard chair with one foam pad without UE support 2 x 10 reps    Patient required supervision to SBA due to decreased balance and moderate verbal cues and demonstration for proper technique.     Manual 4 min.  Patient supine, PA/AP joint mobilzations with small oscillations x 2 min each, gentle overpressure w knee flexion, extension     Modalities 10 min.  PT applied Vasopneumatic compression and cold at 45 degrees, 5-15 mm Hg to R knee w pt in supported long sitting x 10 min       NOT performed today:  -Prone knee flexion with use of sheet for overpressure into flexion with 5 sec holds x .  -Supine heels on 55 cm theraball, quad sets with hold x 5 sec continuous x 5 min.  -Supine heels on 55 cm theraball, B knee flexion with 5 sec hold for continuous x 5 min.  -Prone hangs x 3 min.    Plan details: Modalities (heat, ice, ultrasound, electrical stimulation, iontophoresis), as needed for pain management. Massage, mobilization and manual therapy as needed to increase ROM in restricted soft tissues and joints. Therapeutic exercise, Neuromuscular Re-education, and education.      Subjective:     History of Present Condition     Date of surgery:  05/23/2023    History of Present Condition/Chief Complaint:  [name] who presents to PT s/p Left knee replacement. Patient presents WBAT with FWW, dressings intact. Patient with history of prior Right TKA.  Subjective:  Patient reported slightly decreased pain upon presentation today and reported 6/10 pain upon presentation.      Quality of life:  Good  Pain:     Current pain rating:  6    At best pain rating:  6    At worst pain rating:  10  Location: Circumferentially left knee    Quality:  Aching, tender and stiffness    Relieving factors:  Ice and rest    Aggravating factors:  As the day progresses, bending and performance of leg dominant activites  Precautions/Equipment   Precautions:  Weight bearing as tolerated    Current Braces/Orthoses:  None    Equipment Currently Used:  Rolling walker  Prior Functional Status     Physical limitation(s):  Patient reports limitations of 70% per [facility]  Current Functional Status:    disturbed sleep, limited bending, limited household activities, unsteady gait, limited  walking tolerance and limited standing tolerance  Social Support:     Hand dominance:  Right    Communication Preference:  Verbal, written and visual  Treatments:     Previous treatment:  Surgery  Patient Goals:     Patient/Family goals for therapy:  Improved balance, improved ambulation, increased ROM and increased strength      Objective:       Range of Motion  Knee  Active ROM  Left Knee-Active ROM  Flexion: 70 degrees   Extension: 8 degrees   Passive ROM  Left Knee-Passive ROM    Flexion: 75 degrees   Extension: 0 degrees     Strength  Left Hip   Planes of Motion   Flexion: 4+  Extension: 4+  Abduction: 4  Adduction: 4+  Left Knee   Flexion: 4  Extension: 4-  Additional Strength Details   VMO R=5/5, L=3/5,      Tests   Additional Tests Details:     WOMAC 68/96=70% impairment                Therapeutic Interventions Charges  $$ Therapeutic Activity [mins]: 26  $$ Therapeutic Exercise [mins]: 33  $$ Manual Therapy [mins]: 4                 I attest that I have reviewed the above information.  Signed: Gonzalo Lather, PT  06/26/2023 10:57 PM

## 2023-06-28 ENCOUNTER — Ambulatory Visit: Admit: 2023-06-28 | Payer: Medicaid (Managed Care)

## 2023-06-28 ENCOUNTER — Ambulatory Visit: Admit: 2023-06-28 | Discharge: 2023-07-26 | Payer: Medicaid (Managed Care)

## 2023-07-02 NOTE — Unmapped (Signed)
 [facility]YS[facility]ER[facility] [place]  OUTPATIENT PHYSICAL THERAPY  06/28/2023  Note Type: Treatment Note       Patient Name: Stacy Fox  Date of Birth:06-22-1960  Diagnosis:   Encounter Diagnoses   Name Primary?    Osteoarthritis of left knee, unspecified osteoarthritis type Yes    Aftercare following left knee joint replacement surgery      Referring MD:  Recardo Canal, PA     Date of Onset of Impairment-No date available  Date PT Care Plan Established or Reviewed-05/29/2023  Date PT Treatment Started-No date available   Visit Count: 10  Plan of Care Effective Date: 05/28/2023 - 07/28/2023     Visit 10/16 Reassessment due 07/28/23    Assessment/Plan:    Assessment  Assessment details:    Patient continues to demonstrate good response to PT, with progression of strengthening, proprioception, gait, and balance training with pt reporting no increase in pain. Noted not able to attain 100 degrees flexion today secondary incision pulling from new steristrips. Will continue to progress as per protocol with functional stretching and strengthening exercises as appropriate.       Patient will benefit from skilled PT intervention to address current body structure impairments and activity limitations to return to functions of community and household mobility.           Impairments: decreased range of motion, gait deviation, impaired flexibility, decreased mobility, decreased strength, fall risk, impaired balance and pain          Examination of Body Systems: musculoskeletal    Clinical Presentation: stable    Clinical Decision Making: moderate    Prognosis: good prognosis    Positive Prognosis Rationale: motivated for treatment and response to trial tx.      Therapy Goals      Goals:      GOALS:  Short-term Goals to be achieved in 3 weeks. Patient will:  1. Independent with home exercise program for continued self treatment.  2. Improve L knee AROM by 15 degrees for improved community mobility.  3. Improve LLE strength by ?? muscle grade for improved community mobility.  4. Improve WOMAC Functional Scale by 10pts.  5. Improve L SLS > 15 SECONDS or better for community ambulation.         Long-term Goals to be achieved in 8 weeks. Patient will:  1. Have LLE VMO strength 4+/5 or greater for normal community ambulation.  2. Ambulate greater than 1078ft for normal community ambulation painfree.  3. Ambulate 1 full flight of stairs at and independent level with reciprocal gait.  4. Improve WOMAC Functional Scale 20pts.  5. Improve squatting ability WFL and painfree.      Plan    Therapy options: will be seen for skilled physical therapy services    Planned therapy interventions: 97140-Manual Therapy, 97530-Therapeutic Activities, 97750-Physical Performance Test, 97116-Gait Training, 97110-Therapeutic Exercises, 97112-Neuromuscular Re-education, 97032, G0283-Electrical Stimulation (unattended, attended), 97016-Pneumatic Devices (compression pump) and 97010-Cold Packs/Hot Packs      Frequency: 2x week    Duration in weeks: 8    Education provided to: patient.    Education provided: HEP, Fall prevention and Symptom management    Education results: needs reinforcement and verbalized good understanding.    Communication/Consultation: Discussed with PTA and Initial note sent to Referring Provider.      Total Session Time: 60    Treatment rendered today:      Therapeutic Treatment  DOS 05/23/23     ROM flex: 100 degrees in  sitting, Ext: -5 AROM, 0 AAROM      TE: 33 min.  -Nustep level 6 x 10 min. Seat moved to #3 final 5 min.  -Standing step stretch for flexion 10 x 10 sec  -Standing step stretch for extension 10 x 10 sec  -slant board stretch 3 x 30 sec  -BOSU lunge and hold, 20 x 5 second holds RUE only   -Standing w BUE support, TKE RLE with hip machine 80# 2 x 10 repetitions  -standing w BUE support, hip flexion, abduction, extension w 30# resistance x 10 reps x 2 each BLEs  -Seated at mat table L knee flexion AAROM 10 x 10 holds  -Supine heel slides w strap 3 x 30 holds      TA: 26 min.  -Dynamic gait/balance activities without UE support, 30' each w SBA: High stepping, toe walking, heel walking, side stepping w mini squats, tin soldiers, semi tandem gt  -Step downs off 6 inch step in parallel bars with B UE support 2 x 10 repetitions  -Step ups on 6 inch step forward and lateral with B UE support 2 x 10 repetitions each way  -Sit to stands from standard chair without UE support 2 x 10 reps    Patient required supervision to SBA due to decreased balance and moderate verbal cues and demonstration for proper technique.     Manual 4 min.  Patient supine, PA/AP joint mobilzations with small oscillations x 2 min each, gentle overpressure w knee flexion, extension     Modalities 10 min.  PT applied Vasopneumatic compression and cold at 45 degrees, 5-15 mm Hg to R knee w pt in supported long sitting x 10 min       NOT performed today:  -Prone knee flexion with use of sheet for overpressure into flexion with 5 sec holds x .  -Supine heels on 55 cm theraball, quad sets with hold x 5 sec continuous x 5 min.  -Supine heels on 55 cm theraball, B knee flexion with 5 sec hold for continuous x 5 min.  -Prone hangs x 3 min.    Plan details: Modalities (heat, ice, ultrasound, electrical stimulation, iontophoresis), as needed for pain management. Massage, mobilization and manual therapy as needed to increase ROM in restricted soft tissues and joints. Therapeutic exercise, Neuromuscular Re-education, and education.      Subjective:     History of Present Condition     Date of surgery:  05/23/2023    History of Present Condition/Chief Complaint:  [name] who presents to PT s/p Left knee replacement. Patient presents WBAT with FWW, dressings intact. Patient with history of prior Right TKA.  Subjective:  Patient reported slightly decreased pain upon presentation today and reported 6/10 pain upon presentation.      Quality of life:  Good  Pain: Current pain rating:  6    At best pain rating:  6    At worst pain rating:  10  Location:  Circumferentially left knee    Quality:  Aching, tender and stiffness    Relieving factors:  Ice and rest    Aggravating factors:  As the day progresses, bending and performance of leg dominant activites  Precautions/Equipment   Precautions:  Weight bearing as tolerated    Current Braces/Orthoses:  None    Equipment Currently Used:  Rolling walker  Prior Functional Status     Physical limitation(s):  Patient reports limitations of 70% per [facility]  Current  Functional Status:    disturbed sleep, limited bending, limited household activities, unsteady gait, limited walking tolerance and limited standing tolerance  Social Support:     Hand dominance:  Right    Communication Preference:  Verbal, written and visual  Treatments:     Previous treatment:  Surgery  Patient Goals:     Patient/Family goals for therapy:  Improved balance, improved ambulation, increased ROM and increased strength      Objective:       Range of Motion  Knee  Active ROM  Left Knee-Active ROM  Flexion: 70 degrees   Extension: 8 degrees   Passive ROM  Left Knee-Passive ROM    Flexion: 75 degrees   Extension: 0 degrees     Strength  Left Hip   Planes of Motion   Flexion: 4+  Extension: 4+  Abduction: 4  Adduction: 4+  Left Knee   Flexion: 4  Extension: 4-  Additional Strength Details   VMO R=5/5, L=3/5,      Tests   Additional Tests Details:     WOMAC 68/96=70% impairment                                  I attest that I have reviewed the above information.  Signed: Marcianne Settler, PT  06/28/2023 6:31 AM

## 2023-07-03 DIAGNOSIS — Z471 Aftercare following joint replacement surgery: Principal | ICD-10-CM

## 2023-07-03 DIAGNOSIS — Z96652 Presence of left artificial knee joint: Principal | ICD-10-CM

## 2023-07-03 DIAGNOSIS — M1712 Unilateral primary osteoarthritis, left knee: Principal | ICD-10-CM

## 2023-07-03 NOTE — Unmapped (Signed)
 [facility] PHYSICAL [facility]ERAP[facility] [place]  OUTPATIENT PHYSICAL THERAPY  07/03/2023  Note Type: Treatment Note       Patient Name: Stacy Fox  Date of Birth:27-Jun-1960  Diagnosis:   Encounter Diagnoses   Name Primary?    Osteoarthritis of left knee, unspecified osteoarthritis type Yes    Aftercare following left knee joint replacement surgery      Referring MD:  Recardo Canal, PA     Date of Onset of Impairment-No date available  Date PT Care Plan Established or Reviewed-05/29/2023  Date PT Treatment Started-No date available   Visit Count: 11  Plan of Care Effective Date: 05/28/2023 - 07/28/2023     Visit 11/16 Reassessment due 07/28/23    Assessment/Plan:    Assessment  Assessment details:    Patient continues to respond well to PT treatment with gentle progression of strengthening, proprioception, and balance training with pt. reporting no lasting increase in pain. Noted to attain 100 degrees flexion today in supine with incision noted to be healing well with no open areas observed. Will continue to progress as per protocol with functional stretching and strengthening exercises as appropriate. Patient declined modalities due to no significant increased pain after treatment session.       Patient will benefit from skilled PT intervention to address current body structure impairments and activity limitations to return to functions of community and household mobility.           Impairments: decreased range of motion, gait deviation, impaired flexibility, decreased mobility, decreased strength, fall risk, impaired balance and pain          Examination of Body Systems: musculoskeletal    Clinical Presentation: stable    Clinical Decision Making: moderate    Prognosis: good prognosis    Positive Prognosis Rationale: motivated for treatment and response to trial tx.      Therapy Goals      Goals:      GOALS:  Short-term Goals to be achieved in 3 weeks. Patient will:  1. Independent with home exercise program for continued self treatment.  2. Improve L knee AROM by 15 degrees for improved community mobility.  3. Improve LLE strength by ?? muscle grade for improved community mobility.  4. Improve WOMAC Functional Scale by 10pts.  5. Improve L SLS > 15 SECONDS or better for community ambulation.         Long-term Goals to be achieved in 8 weeks. Patient will:  1. Have LLE VMO strength 4+/5 or greater for normal community ambulation.  2. Ambulate greater than 1057ft for normal community ambulation painfree.  3. Ambulate 1 full flight of stairs at and independent level with reciprocal gait.  4. Improve WOMAC Functional Scale 20pts.  5. Improve squatting ability WFL and painfree.      Plan    Therapy options: will be seen for skilled physical therapy services    Planned therapy interventions: 97140-Manual Therapy, 97530-Therapeutic Activities, 97750-Physical Performance Test, 97116-Gait Training, 97110-Therapeutic Exercises, 97112-Neuromuscular Re-education, 97032, G0283-Electrical Stimulation (unattended, attended), 97016-Pneumatic Devices (compression pump) and 97010-Cold Packs/Hot Packs      Frequency: 2x week    Duration in weeks: 8    Education provided to: patient.    Education provided: HEP, Fall prevention and Symptom management    Education results: needs reinforcement and verbalized good understanding.    Communication/Consultation: Discussed with PTA and Initial note sent to Referring Provider.      Total Session Time: 60    Treatment rendered today:  Therapeutic Treatment  DOS 05/23/23     ROM flexion: 100 degrees in supine AAROM    TE: 33 min.  -Nustep level 6 x 10 min. Seat moved to #3 final 5 min.  -Standing step stretch for flexion 20 x 10 sec  -Standing step stretch for extension 20 x 10 sec  -Standing w BUE support, TKE RLE with hip machine 80# 3 x 10 repetitions  -Seated at mat table L knee flexion AAROM 10 x 10 holds  -Supine heel slides w strap 5 x 30 holds    97110 TE: Pt. was progressed to tolerance with left knee strengthening & stretching exercises as above to increase pt.'s ability to meet functional goals. Pt. required demonstration & min. VCs & tactile cues for proper exercise technique and to maintain upright posture.    TA: 23 min.  -Standing marching on blue foam AirEx x2 minutes  -Single leg stance on LLE 10 x 5 holds  -Dynamic gait/balance activities without UE support, 30' each w SBA: High stepping, toe walking, heel walking, side stepping w mini squats, tin soldiers, semi tandem gt  -Step downs off 6 inch step in parallel bars with B UE support 2 x 10 repetitions  -Step ups on 6 inch step forward and lateral with B UE support 2 x 10 repetitions each way  -Sit to stands from standard chair without UE support 2 x 10 reps    Patient required supervision to SBA due to decreased balance and moderate verbal cues and demonstration for proper technique and to maintain proper posture throughout exercises.     Manual 4 min.  Patient supine, PA/AP joint mobilzations with small oscillations x 2 min each, gentle overpressure with knee flexion     Modalities: Patient declined due to no increased pain.       NOT performed today:  -Prone knee flexion with use of sheet for overpressure into flexion with 5 sec holds x .  -Supine heels on 55 cm theraball, quad sets with hold x 5 sec continuous x 5 min.  -Supine heels on 55 cm theraball, B knee flexion with 5 sec hold for continuous x 5 min.  -Prone hangs x 3 min.  -standing w BUE support, hip flexion, abduction, extension w 30# resistance x 10 reps x 2 each BLEs  -slant board stretch 3 x 30 sec  -BOSU lunge and hold, 20 x 5 second holds RUE only     Plan details: Modalities (heat, ice, ultrasound, electrical stimulation, iontophoresis), as needed for pain management. Massage, mobilization and manual therapy as needed to increase ROM in restricted soft tissues and joints. Therapeutic exercise, Neuromuscular Re-education, and education.      Subjective:     History of Present Condition     Date of surgery:  05/23/2023    History of Present Condition/Chief Complaint:  [name] who presents to PT s/p Left knee replacement. Patient presents WBAT with FWW, dressings intact. Patient with history of prior Right TKA.  Subjective:  Patient reported slightly increased pain upon presentation today and reported 7/10 pain upon presentation. Patient reported sitting too long at [facility] and that her knee got stiff.     Quality of life:  Good  Pain:     Current pain rating:  7    At best pain rating:  6    At worst pain rating:  10  Location:  Circumferentially left knee    Quality:  Aching, tender and  stiffness    Relieving factors:  Ice and rest    Aggravating factors:  As the day progresses, bending and performance of leg dominant activites  Precautions/Equipment   Precautions:  Weight bearing as tolerated    Current Braces/Orthoses:  None    Equipment Currently Used:  Rolling walker  Prior Functional Status     Physical limitation(s):  Patient reports limitations of 70% per [facility]  Current Functional Status:    disturbed sleep, limited bending, limited household activities, unsteady gait, limited walking tolerance and limited standing tolerance  Social Support:     Hand dominance:  Right    Communication Preference:  Verbal, written and visual  Treatments:     Previous treatment:  Surgery  Patient Goals:     Patient/Family goals for therapy:  Improved balance, improved ambulation, increased ROM and increased strength      Objective:       Range of Motion  Knee  Active ROM  Left Knee-Active ROM  Flexion: 85 degrees with pain  Extension: 8 degrees   Passive ROM  Left Knee-Passive ROM    Flexion: 100 degrees with pain  Extension: 0 degrees     Strength  Left Hip   Planes of Motion   Flexion: 4+  Extension: 4+  Abduction: 4  Adduction: 4+  Left Knee   Flexion: 4  Extension: 4-  Additional Strength Details   VMO R=5/5, L=3/5,      Tests   Additional Tests Details: WOMAC 68/96=70% impairment                                  I attest that I have reviewed the above information.  Signed: Antonia Battiest, PT  07/03/2023 9:57 AM

## 2023-07-04 DIAGNOSIS — Z96652 Presence of left artificial knee joint: Principal | ICD-10-CM

## 2023-07-04 DIAGNOSIS — M1712 Unilateral primary osteoarthritis, left knee: Principal | ICD-10-CM

## 2023-07-04 DIAGNOSIS — Z471 Aftercare following joint replacement surgery: Principal | ICD-10-CM

## 2023-07-04 NOTE — Unmapped (Addendum)
 [facility] PHYSICAL THERAPY [place]  OUTPATIENT PHYSICAL THERAPY  07/04/2023  Note Type: Re-Evaluation       Patient Name: Stacy Fox  Date of Birth:May 11, 1960  Diagnosis:   Encounter Diagnoses   Name Primary?    Osteoarthritis of left knee, unspecified osteoarthritis type Yes    Aftercare following left knee joint replacement surgery      Referring MD:  Recardo Canal, PA     Date of Onset of Impairment-No date available  Date PT Care Plan Established or Reviewed-05/29/2023  Date PT Treatment Started-No date available   Visit Count: 12  Plan of Care Effective Date: 05/28/2023 - 07/28/2023     Visit 12/24 Re-evaluation due 07/28/23    Assessment/Plan:    Assessment  Assessment details:    Patient responded well to gentle progression of PT treatment today and reported reduced pain after treatment session today. Will continue to progress with knee stretching and strengthening exercises as patient able to tolerate. Patient is progressing well toward functional goals and has met 3/5 short term goals. Patient will continue to benefit from skilled PT intervention to address current body structure impairments and activity limitations to return to functions of community and household mobility.           Impairments: decreased range of motion, gait deviation, impaired flexibility, decreased mobility, decreased strength, fall risk, impaired balance and pain          Examination of Body Systems: musculoskeletal    Clinical Presentation: stable    Clinical Decision Making: moderate    Prognosis: good prognosis    Positive Prognosis Rationale: motivated for treatment and response to trial tx.      Therapy Goals      Goals:      GOALS:  Short-term Goals to be achieved in 3 weeks. Patient will:  1. Independent with home exercise program for continued self treatment. MET  2. Improve L knee AROM by 15 degrees for improved community mobility. MET  3. Improve LLE strength by ?? muscle grade for improved community mobility. MET  4. Improve WOMAC Functional Scale by 10pts. PROGRESSING  5. Improve L SLS > 15 SECONDS or better for community ambulation. PROGRESSING        Long-term Goals to be achieved in 8 weeks. Patient will:  1. Have LLE VMO strength 4+/5 or greater for normal community ambulation. PROGRESSING  2. Ambulate greater than 1016ft for normal community ambulation painfree. PROGRESSING  3. Ambulate 1 full flight of stairs at and independent level with reciprocal gait. PROGRESSING  4. Improve WOMAC Functional Scale 20pts. PROGRESSING  5. Improve squatting ability [facility] and painfree. PROGRESSING      Plan    Therapy options: will be seen for skilled physical therapy services    Planned therapy interventions: 97140-Manual Therapy, 97530-Therapeutic Activities, 97750-Physical Performance Test, 97116-Gait Training, 97110-Therapeutic Exercises, 97112-Neuromuscular Re-education, 97032, G0283-Electrical Stimulation (unattended, attended), 97016-Pneumatic Devices (compression pump) and 97010-Cold Packs/Hot Packs      Frequency: 2x week    Duration in weeks: 4    Education provided to: patient.    Education provided: HEP, Fall prevention and Symptom management    Education results: needs reinforcement and verbalized good understanding.    Communication/Consultation: Discussed with PTA and Initial note sent to Referring Provider.      Total Session Time: 67    Treatment rendered today:      Therapeutic Treatment  DOS 05/23/23     ROM flexion: 100 degrees in supine AAROM  TE: 33 min.  -Bike for range of motion x10 minutes (able to make a full rotation retro)  -Standing step stretch for flexion 3 x 30 sec  -Standing step stretch for extension 3 x 30 sec  -Standing w BUE support, TKE RLE with hip machine 80# 3 x 10 repetitions  -Seated at mat table L knee flexion AAROM 10 x 10 holds  -Supine heel slides w strap 5 x 30 holds  -Seated hamstring stretch 3 x 30 holds  -Supine hamstring stretch 3 x 30 holds  -Leg press 2 x 15 80#    97110 TE: Pt. was progressed to tolerance with left knee strengthening & stretching exercises as above to increase pt.'s ability to meet functional goals. Pt. required demonstration & min. VCs & tactile cues for proper exercise technique and to maintain upright posture.    TA: 20 min.  -Standing marching on blue foam AirEx x2 minutes  -Single leg stance on LLE 2x10 x 3 holds  -Step downs off 6 inch step in parallel bars with B UE support 2 x 10 repetitions  -Step ups on 6 inch step forward and lateral with B UE support 2 x 10 repetitions each way  -Sit to stands from standard chair without UE support 2 x 10 reps  -Foam marching x1 minute    Patient required supervision to SBA due to decreased balance and moderate verbal cues and demonstration for proper technique and to maintain proper posture throughout exercises.     Manual 4 min.  Patient supine, PA/AP joint mobilzations with small oscillations x 2 min each, gentle overpressure with knee flexion     Modalities: 10 minutes    97016 Vasopneumatic Device: PT applied game ready to L knee on low pressure after TE to decrease pain & inflammation x 10 min. with left lower extremity elevated and pt. supine.      NOT performed today:  -Prone knee flexion with use of sheet for overpressure into flexion with 5 sec holds x .  -Supine heels on 55 cm theraball, quad sets with hold x 5 sec continuous x 5 min.  -Supine heels on 55 cm theraball, B knee flexion with 5 sec hold for continuous x 5 min.  -Prone hangs x 3 min.  -standing w BUE support, hip flexion, abduction, extension w 30# resistance x 10 reps x 2 each BLEs  -slant board stretch 3 x 30 sec  -BOSU lunge and hold, 20 x 5 second holds RUE only     Plan details: Modalities (heat, ice, ultrasound, electrical stimulation, iontophoresis), as needed for pain management. Massage, mobilization and manual therapy as needed to increase ROM in restricted soft tissues and joints. Therapeutic exercise, Neuromuscular Re-education, and education.      Subjective:     History of Present Condition     Date of surgery:  05/23/2023    History of Present Condition/Chief Complaint:  [name] who presents to PT s/p Left knee replacement. Patient presents WBAT with FWW, dressings intact. Patient with history of prior Right TKA.  Subjective:  Patient reported 8/10 knee pain upon presentation today and reported that her knee was feeling, stiff.      Quality of life:  Good  Pain:     Current pain rating:  8    At best pain rating:  5    At worst pain rating:  10  Location:  Circumferentially left knee    Quality:  Aching, tender and  stiffness    Relieving factors:  Ice and rest    Aggravating factors:  As the day progresses, bending and performance of leg dominant activites  Precautions/Equipment   Precautions:  Weight bearing as tolerated    Current Braces/Orthoses:  None    Equipment Currently Used:  Rolling walker  Prior Functional Status     Physical limitation(s):  Patient reports limitations of 70% per [facility]  Current Functional Status:    disturbed sleep, limited bending, limited household activities, unsteady gait, limited walking tolerance and limited standing tolerance  Social Support:     Hand dominance:  Right    Communication Preference:  Verbal, written and visual  Treatments:     Previous treatment:  Surgery  Patient Goals:     Patient/Family goals for therapy:  Improved balance, improved ambulation, increased ROM and increased strength      Objective:       Range of Motion  Knee  Active ROM  Left Knee-Active ROM  Flexion: 90 degrees with pain  Extension: 7 degrees   Passive ROM  Left Knee-Passive ROM    Flexion: 100 degrees with pain  Extension: 0 degrees     Strength  Left Hip   Planes of Motion   Flexion: 4+  Extension: 4+  Abduction: 4  Adduction: 4+  Left Knee   Flexion: 4  Extension: 4-  Additional Strength Details   VMO R=5/5, L=3/5,      Tests     Functional Assessment   Lower Extremity Functional Scale  LEFS Score (out of 80): 8  Assessment: 90% funcitonal limitation  Additional Tests Details:     WOMAC 69/96=72% impairment                Therapeutic Interventions Charges  $$ Therapeutic Activity [mins]: 20  $$ Therapeutic Exercise [mins]: 33  $$ Manual Therapy [mins]: 4     Physical Agent Modalities Charges  $$ Vasopneumatic Therapy [mins]: 10           I attest that I have reviewed the above information.  Signed: Antonia Battiest, PT  07/04/2023 10:08 AM

## 2023-07-10 DIAGNOSIS — M1712 Unilateral primary osteoarthritis, left knee: Principal | ICD-10-CM

## 2023-07-10 DIAGNOSIS — Z96652 Presence of left artificial knee joint: Principal | ICD-10-CM

## 2023-07-10 DIAGNOSIS — Z471 Aftercare following joint replacement surgery: Principal | ICD-10-CM

## 2023-07-10 MED FILL — OZEMPIC 2 MG/DOSE (8 MG/3 ML) SUBCUTANEOUS PEN INJECTOR: SUBCUTANEOUS | 28 days supply | Qty: 3 | Fill #4

## 2023-07-10 NOTE — Unmapped (Signed)
 [facility] PHYS[facility]ERAP[facility] [place]  OUTPATIENT PHYSICAL THERAPY  07/10/2023  Note Type: Treatment Note       Patient Name: Stacy Fox  Date of Birth:1960/12/24  Diagnosis:   Encounter Diagnoses   Name Primary?    Osteoarthritis of left knee, unspecified osteoarthritis type Yes    Aftercare following left knee joint replacement surgery      Referring MD:  Recardo Canal, PA     Date of Onset of Impairment-No date available  Date PT Care Plan Established or Reviewed-05/29/2023  Date PT Treatment Started-No date available   Visit Count: 13  Plan of Care Effective Date: 05/28/2023 - 07/28/2023     Visit 13/24 Re-evaluation due 07/28/23    Assessment/Plan:    Assessment  Assessment details:    Patient again responded well to gentle progression of PT treatment today and reported reduced pain after treatment session today. Will continue to progress with knee stretching and strengthening exercises as patient able to tolerate.       Patient will continue to benefit from skilled PT intervention to address current body structure impairments and activity limitations to return to functions of community and household mobility.           Impairments: decreased range of motion, gait deviation, impaired flexibility, decreased mobility, decreased strength, fall risk, impaired balance and pain          Examination of Body Systems: musculoskeletal    Clinical Presentation: stable    Clinical Decision Making: moderate    Prognosis: good prognosis    Positive Prognosis Rationale: motivated for treatment and response to trial tx.      Therapy Goals      Goals:      GOALS:  Short-term Goals to be achieved in 3 weeks. Patient will:  1. Independent with home exercise program for continued self treatment. MET  2. Improve L knee AROM by 15 degrees for improved community mobility. MET  3. Improve LLE strength by ?? muscle grade for improved community mobility. MET  4. Improve WOMAC Functional Scale by 10pts. PROGRESSING  5. Improve L SLS > 15 SECONDS or better for community ambulation. PROGRESSING        Long-term Goals to be achieved in 8 weeks. Patient will:  1. Have LLE VMO strength 4+/5 or greater for normal community ambulation. PROGRESSING  2. Ambulate greater than 1058ft for normal community ambulation painfree. PROGRESSING  3. Ambulate 1 full flight of stairs at and independent level with reciprocal gait. PROGRESSING  4. Improve WOMAC Functional Scale 20pts. PROGRESSING  5. Improve squatting ability [facility] and painfree. PROGRESSING      Plan    Therapy options: will be seen for skilled physical therapy services    Planned therapy interventions: 97140-Manual Therapy, 97530-Therapeutic Activities, 97750-Physical Performance Test, 97116-Gait Training, 97110-Therapeutic Exercises, 97112-Neuromuscular Re-education, 97032, G0283-Electrical Stimulation (unattended, attended), 97016-Pneumatic Devices (compression pump) and 97010-Cold Packs/Hot Packs      Frequency: 2x week    Duration in weeks: 4    Education provided to: patient.    Education provided: HEP, Fall prevention and Symptom management    Education results: needs reinforcement and verbalized good understanding.    Communication/Consultation: Discussed with PTA and Initial note sent to Referring Provider.      Total Session Time: 60    Treatment rendered today:      Therapeutic Treatment  DOS 05/23/23     ROM flexion: 102 degrees in supine AAROM    TE: 33 min.  -  Bike for range of motion x10 minutes (able to make a full rotation retro)  -Standing step stretch for flexion 3 x 30 sec  -Standing step stretch for extension 3 x 30 sec  -Leg press 3 x 10 100#  -Seated self-AROM at edge of mat table with leaning into left knee flexion x15 reps 5 holds   -Prone quadriceps stretch 3  x 30   -Supine heel slides w strap 5 x 20 holds  -Supine hamstring stretch with 2# weight x2 minutes    97110 TE: Pt. was progressed to tolerance with left knee strengthening & stretching exercises as above to increase pt.'s ability to meet functional goals. Pt. required demonstration & min. VCs & tactile cues for proper exercise technique and to maintain upright posture.    TA: 21 min.  -Standing marching on blue foam AirEx x3 minutes  -Single leg stance on LLE 2x10 x 3 holds  -Step up and overs at 8 step 3 x 10 reps      Patient required supervision to SBA due to decreased balance and moderate verbal cues and demonstration for proper technique and to maintain proper posture throughout exercises.     Manual 6 min.  Patient supine, PA/AP joint mobilzations with small oscillations x 2 min each, gentle overpressure with knee flexion. This was followed by gentle deep tissue mobilization to left quadriceps musculature with multiple moderate to severe trigger points noted throughout x2 minutes.     Modalities: Patient declined today.       NOT performed today:  -Prone knee flexion with use of sheet for overpressure into flexion with 5 sec holds x .  -Supine heels on 55 cm theraball, quad sets with hold x 5 sec continuous x 5 min.  -Supine heels on 55 cm theraball, B knee flexion with 5 sec hold for continuous x 5 min.  -Prone hangs x 3 min.  -standing w BUE support, hip flexion, abduction, extension w 30# resistance x 10 reps x 2 each BLEs  -slant board stretch 3 x 30 sec  -BOSU lunge and hold, 20 x 5 second holds RUE only   -Standing w BUE support, TKE RLE with hip machine 80# 3 x 10 repetitions  -Seated at mat table L knee flexion AAROM 10 x 10 holds  -Seated hamstring stretch 3 x 30 holds    Plan details: Modalities (heat, ice, ultrasound, electrical stimulation, iontophoresis), as needed for pain management. Massage, mobilization and manual therapy as needed to increase ROM in restricted soft tissues and joints. Therapeutic exercise, Neuromuscular Re-education, and education.      Subjective:     History of Present Condition     Date of surgery:  05/23/2023    History of Present Condition/Chief Complaint:  [name] who presents to PT s/p Left knee replacement. Patient presents WBAT with FWW, dressings intact. Patient with history of prior Right TKA.  Subjective:  Patient reported 8/10 knee pain upon presentation again today and reported that her knee was still feeling stiff.      Quality of life:  Good  Pain:     Current pain rating:  8    At best pain rating:  5    At worst pain rating:  10  Location:  Circumferentially left knee    Quality:  Aching, tender and stiffness    Relieving factors:  Ice and rest    Aggravating factors:  As the day progresses, bending and performance of leg  dominant activites  Precautions/Equipment   Precautions:  Weight bearing as tolerated    Current Braces/Orthoses:  None    Equipment Currently Used:  Rolling walker  Prior Functional Status     Physical limitation(s):  Patient reports limitations of 70% per [facility]  Current Functional Status:    disturbed sleep, limited bending, limited household activities, unsteady gait, limited walking tolerance and limited standing tolerance  Social Support:     Hand dominance:  Right    Communication Preference:  Verbal, written and visual  Treatments:     Previous treatment:  Surgery  Patient Goals:     Patient/Family goals for therapy:  Improved balance, improved ambulation, increased ROM and increased strength      Objective:       Range of Motion  Knee  Active ROM  Left Knee-Active ROM  Flexion: 90 degrees with pain  Extension: 7 degrees   Passive ROM  Left Knee-Passive ROM    Flexion: 100 degrees with pain  Extension: 0 degrees     Strength  Left Hip   Planes of Motion   Flexion: 4+  Extension: 4+  Abduction: 4  Adduction: 4+  Left Knee   Flexion: 4  Extension: 4-  Additional Strength Details   VMO R=5/5, L=3/5,      Tests     Functional Assessment   Lower Extremity Functional Scale  LEFS Score (out of 80): 8  Assessment: 90% funcitonal limitation  Additional Tests Details:     WOMAC 69/96=72% impairment                                  I attest that I have reviewed the above information.  Signed: Antonia Battiest, PT  07/10/2023 10:09 AM

## 2023-07-12 DIAGNOSIS — M1712 Unilateral primary osteoarthritis, left knee: Principal | ICD-10-CM

## 2023-07-12 DIAGNOSIS — Z471 Aftercare following joint replacement surgery: Principal | ICD-10-CM

## 2023-07-12 DIAGNOSIS — Z96652 Presence of left artificial knee joint: Principal | ICD-10-CM

## 2023-07-12 NOTE — Unmapped (Signed)
 [facility] PHYSICAL THERAPY [place]  OUTPATIENT PHYSICAL THERAPY  07/12/2023  Note Type: Treatment Note       Patient Name: Stacy Fox  Date of Birth:09/21/1960  Diagnosis:   Encounter Diagnoses   Name Primary?    Osteoarthritis of left knee, unspecified osteoarthritis type Yes    Aftercare following left knee joint replacement surgery      Referring MD:  Recardo Canal, PA     Date of Onset of Impairment-No date available  Date PT Care Plan Established or Reviewed-05/29/2023  Date PT Treatment Started-No date available   Visit Count: 14  Plan of Care Effective Date: 05/28/2023 - 07/28/2023     Visit 14/24 Re-evaluation due 07/28/23    Assessment/Plan:    Assessment  Assessment details:    Patient again responded well to functional progression of PT treatment today and reported reduced pain after treatment session today. Will continue to progress with knee stretching and strengthening exercises as patient able to tolerate. Patient noted to be able to attain 104 degrees AAROM knee flexion today.       Patient will continue to benefit from skilled PT intervention to address current body structure impairments and activity limitations to return to functions of community and household mobility.           Impairments: decreased range of motion, gait deviation, impaired flexibility, decreased mobility, decreased strength, fall risk, impaired balance and pain          Examination of Body Systems: musculoskeletal    Clinical Presentation: stable    Clinical Decision Making: moderate    Prognosis: good prognosis    Positive Prognosis Rationale: motivated for treatment and response to trial tx.      Therapy Goals      Goals:      GOALS:  Short-term Goals to be achieved in 3 weeks. Patient will:  1. Independent with home exercise program for continued self treatment. MET  2. Improve L knee AROM by 15 degrees for improved community mobility. MET  3. Improve LLE strength by ?? muscle grade for improved community mobility. MET  4. Improve WOMAC Functional Scale by 10pts. PROGRESSING  5. Improve L SLS > 15 SECONDS or better for community ambulation. PROGRESSING        Long-term Goals to be achieved in 8 weeks. Patient will:  1. Have LLE VMO strength 4+/5 or greater for normal community ambulation. PROGRESSING  2. Ambulate greater than 1038ft for normal community ambulation painfree. PROGRESSING  3. Ambulate 1 full flight of stairs at and independent level with reciprocal gait. PROGRESSING  4. Improve WOMAC Functional Scale 20pts. PROGRESSING  5. Improve squatting ability [facility] and painfree. PROGRESSING      Plan    Therapy options: will be seen for skilled physical therapy services    Planned therapy interventions: 97140-Manual Therapy, 97530-Therapeutic Activities, 97750-Physical Performance Test, 97116-Gait Training, 97110-Therapeutic Exercises, 97112-Neuromuscular Re-education, 97032, G0283-Electrical Stimulation (unattended, attended), 97016-Pneumatic Devices (compression pump) and 97010-Cold Packs/Hot Packs      Frequency: 2x week    Duration in weeks: 4    Education provided to: patient.    Education provided: HEP, Fall prevention and Symptom management    Education results: needs reinforcement and verbalized good understanding.    Communication/Consultation: Discussed with PTA and Initial note sent to Referring Provider.      Total Session Time: 67    Treatment rendered today:      Therapeutic Treatment  DOS 05/23/23     ROM  flexion: 104 degrees in supine AAROM  ROM extension -2 degrees AROM; 0 degrees PROM     TE: 32 min.  -Bike for range of motion x10 minutes (seat #6 to seat #5) able to make full rotation retro  -Standing step stretch for flexion 3 x 30 sec  -Standing step stretch for extension 3 x 30 sec  -Leg press 3 x 10 110#  -Seated self-AROM at edge of mat table with leaning into left knee flexion x15 reps 5 holds   -Supine heel slides with strap 4 x 20 holds  -Supine hamstring stretch with 2# weight x2 minutes    97110 TE: Pt. was progressed to tolerance with left knee strengthening & stretching exercises as above to increase pt.'s ability to meet functional goals. Pt. required demonstration & min. VCs & tactile cues for proper exercise technique and to maintain upright posture.    TA: 22 min.    -Standing marching on blue foam AirEx x30 reps   -Sit to stand 3 x 10 reps   -Stair negotiation ascending and descending 9 steps x2 reps with step-to pattern on LLE  -Single leg stance on LLE x10 x 5 holds  -Step up and overs at 8 step 2 x 10 reps    Patient required supervision to SBA due to decreased balance and moderate verbal cues and demonstration for proper technique and to maintain proper posture throughout exercises.     Manual 3 min.  Patient given gentle grade III patella mobilizations to end-range with small oscillations in all directions with patient supine.     Modalities: x10 minutes  97016 Vasopneumatic Device: PT applied game ready to L knee on low pressure after TE to decrease pain & inflammation x 10 min. with left lower extremity elevated and pt. supine.      NOT performed today:  -Prone knee flexion with use of sheet for overpressure into flexion with 5 sec holds x .  -Supine heels on 55 cm theraball, quad sets with hold x 5 sec continuous x 5 min.  -Supine heels on 55 cm theraball, B knee flexion with 5 sec hold for continuous x 5 min.  -Prone hangs x 3 min.  -standing w BUE support, hip flexion, abduction, extension w 30# resistance x 10 reps x 2 each BLEs  -slant board stretch 3 x 30 sec  -BOSU lunge and hold, 20 x 5 second holds RUE only   -Standing w BUE support, TKE RLE with hip machine 80# 3 x 10 repetitions  -Seated at mat table L knee flexion AAROM 10 x 10 holds  -Seated hamstring stretch 3 x 30 holds    Plan details: Modalities (heat, ice, ultrasound, electrical stimulation, iontophoresis), as needed for pain management. Massage, mobilization and manual therapy as needed to increase ROM in restricted soft tissues and joints. Therapeutic exercise, Neuromuscular Re-education, and education.      Subjective:     History of Present Condition     Date of surgery:  05/23/2023    History of Present Condition/Chief Complaint:  [name] who presents to PT s/p Left knee replacement. Patient presents WBAT with FWW, dressings intact. Patient with history of prior Right TKA.  Subjective:  Patient reported 8/10 knee pain upon presentation again today and reported that her knee was still feeling stiff.      Quality of life:  Good  Pain:     Current pain rating:  8    At best pain  rating:  5    At worst pain rating:  10  Location:  Circumferentially left knee    Quality:  Aching, tender and stiffness    Relieving factors:  Ice and rest    Aggravating factors:  As the day progresses, bending and performance of leg dominant activites  Precautions/Equipment   Precautions:  Weight bearing as tolerated    Current Braces/Orthoses:  None    Equipment Currently Used:  Rolling walker  Prior Functional Status     Physical limitation(s):  Patient reports limitations of 70% per [facility]  Current Functional Status:    disturbed sleep, limited bending, limited household activities, unsteady gait, limited walking tolerance and limited standing tolerance  Social Support:     Hand dominance:  Right    Communication Preference:  Verbal, written and visual  Treatments:     Previous treatment:  Surgery  Patient Goals:     Patient/Family goals for therapy:  Improved balance, improved ambulation, increased ROM and increased strength      Objective:       Range of Motion  Knee  Active ROM  Left Knee-Active ROM  Flexion: 90 degrees with pain  Extension: 7 degrees   Passive ROM  Left Knee-Passive ROM    Flexion: 100 degrees with pain  Extension: 0 degrees     Strength  Left Hip   Planes of Motion   Flexion: 4+  Extension: 4+  Abduction: 4  Adduction: 4+  Left Knee   Flexion: 4  Extension: 4-  Additional Strength Details   VMO R=5/5, L=3/5, Tests     Functional Assessment   Lower Extremity Functional Scale  LEFS Score (out of 80): 8  Assessment: 90% funcitonal limitation  Additional Tests Details:     WOMAC 69/96=72% impairment                                  I attest that I have reviewed the above information.  Signed: Antonia Battiest, PT  07/12/2023 10:19 AM

## 2023-07-17 DIAGNOSIS — M1712 Unilateral primary osteoarthritis, left knee: Principal | ICD-10-CM

## 2023-07-17 DIAGNOSIS — Z471 Aftercare following joint replacement surgery: Principal | ICD-10-CM

## 2023-07-17 DIAGNOSIS — Z96652 Presence of left artificial knee joint: Principal | ICD-10-CM

## 2023-07-17 NOTE — Unmapped (Signed)
 [facility]  OUTPATIENT PHYSICAL THERAPY  07/17/2023  Note Type: Treatment Note       Patient Name: Stacy Fox  Date of Birth:12-24-1960  Diagnosis:   Encounter Diagnoses   Name Primary?    Osteoarthritis of left knee, unspecified osteoarthritis type Yes    Aftercare following left knee joint replacement surgery      Referring MD:  Recardo Canal, PA     Date of Onset of Impairment-No date available  Date PT Care Plan Established or Reviewed-05/29/2023  Date PT Treatment Started-No date available   Visit Count: 15  Plan of Care Effective Date: 05/28/2023 - 07/28/2023     Visit 14/24 Re-evaluation due 07/28/23    Assessment/Plan:    Assessment  Assessment details:    Patient again responded well to functional progression of PT treatment today and reported reduced pain after treatment session today. Will continue to progress with knee stretching and strengthening exercises as patient able to tolerate. Patient noted to be able to attain 105 degrees AAROM knee flexion today.       Patient will continue to benefit from skilled PT intervention to address current body structure impairments and activity limitations to return to functions of community and household mobility.           Impairments: decreased range of motion, gait deviation, impaired flexibility, decreased mobility, decreased strength, fall risk, impaired balance and pain          Examination of Body Systems: musculoskeletal    Clinical Presentation: stable    Clinical Decision Making: moderate    Prognosis: good prognosis    Positive Prognosis Rationale: motivated for treatment and response to trial tx.      Therapy Goals      Goals:      GOALS:  Short-term Goals to be achieved in 3 weeks. Patient will:  1. Independent with home exercise program for continued self treatment. MET  2. Improve L knee AROM by 15 degrees for improved community mobility. MET  3. Improve LLE strength by ?? muscle grade for improved community mobility. MET  4. Improve WOMAC Functional Scale by 10pts. PROGRESSING  5. Improve L SLS > 15 SECONDS or better for community ambulation. PROGRESSING        Long-term Goals to be achieved in 8 weeks. Patient will:  1. Have LLE VMO strength 4+/5 or greater for normal community ambulation. PROGRESSING  2. Ambulate greater than 107ft for normal community ambulation painfree. PROGRESSING  3. Ambulate 1 full flight of stairs at and independent level with reciprocal gait. PROGRESSING  4. Improve WOMAC Functional Scale 20pts. PROGRESSING  5. Improve squatting ability [facility] and painfree. PROGRESSING      Plan    Therapy options: will be seen for skilled physical therapy services    Planned therapy interventions: 97140-Manual Therapy, 97530-Therapeutic Activities, 97750-Physical Performance Test, 97116-Gait Training, 97110-Therapeutic Exercises, 97112-Neuromuscular Re-education, 97032, G0283-Electrical Stimulation (unattended, attended), 97016-Pneumatic Devices (compression pump) and 97010-Cold Packs/Hot Packs      Frequency: 2x week    Duration in weeks: 4    Education provided to: patient.    Education provided: HEP, Fall prevention and Symptom management    Education results: needs reinforcement and verbalized good understanding.    Communication/Consultation: Discussed with PTA and Initial note sent to Referring Provider.      Total Session Time: 71    Treatment rendered today:      Therapeutic Treatment  DOS 05/23/23     ROM  flexion: 105 degrees in supine AAROM  ROM extension 0 degrees AROM    TE: 36 min.  -Bike for range of motion x8 minutes (seat #5) able to make full rotation retro  -Standing step stretch for flexion 3 x 30 sec  -Standing step stretch for extension 3 x 30 sec  -Standing TKE 40# 3 x 10 reps   -Leg press 3 x 10 100#  -Prone quadriceps stretch 4 x 30 holds   -Supine heel slides with strap 3 x 30 holds  -Supine hamstring stretch with 2# weight x2 minutes    97110 TE: Pt. was progressed to tolerance with left knee strengthening & stretching exercises as above to increase pt.'s ability to meet functional goals. Pt. required demonstration & min. VCs & tactile cues for proper exercise technique and to maintain upright posture.    TA: 22 min.    -Tandem stance on blue foam Airex x20 reps 5 holds  -Standing marching on blue foam AirEx x2 minutes   -Stair negotiation ascending and descending 9 steps x2 reps with step-to pattern on LLE  -Dynamic gait x50 feet for each activity: lateal step with mini-squat, retrograde walking, tin soldier, high knee march, hamstring curls  -Sit to stand 2 x 10 reps       Patient required supervision to SBA due to decreased balance and moderate verbal cues and demonstration for proper technique and to maintain proper posture throughout exercises.     Manual 3 min.  Patient given gentle grade III tibiofemoral mobilizations to end-range with small oscillations in all directions with patient supine.     Modalities: x10 minutes  97016 Vasopneumatic Device: PT applied game ready to L knee on low pressure after TE to decrease pain & inflammation x 10 min. with left lower extremity elevated and pt. supine.      NOT performed today:  -Single leg stance on LLE x10 x 5 holds  -Step up and overs at 8 step 2 x 10 reps  -Prone knee flexion with use of sheet for overpressure into flexion with 5 sec holds x .    -Prone hangs x 3 min.  -standing w BUE support, hip flexion, abduction, extension w 30# resistance x 10 reps x 2 each BLEs  -slant board stretch 3 x 30 sec  -BOSU lunge and hold, 20 x 5 second holds RUE only   -Seated at mat table L knee flexion AAROM 10 x 10 holds  -Seated hamstring stretch 3 x 30 holds    Plan details: Modalities (heat, ice, ultrasound, electrical stimulation, iontophoresis), as needed for pain management. Massage, mobilization and manual therapy as needed to increase ROM in restricted soft tissues and joints. Therapeutic exercise, Neuromuscular Re-education, and education.      Subjective:     History of Present Condition     Date of surgery:  05/23/2023    History of Present Condition/Chief Complaint:  [name] who presents to PT s/p Left knee replacement. Patient presents WBAT with FWW, dressings intact. Patient with history of prior Right TKA.  Subjective:  Patient still reported 8/10 knee pain upon presentation again today and reported that her knee was still feeling stiff.      Quality of life:  Good  Pain:     Current pain rating:  8    At best pain rating:  5    At worst pain rating:  10  Location:  Circumferentially left knee    Quality:  Aching, tender and stiffness    Relieving factors:  Ice and rest    Aggravating factors:  As the day progresses, bending and performance of leg dominant activites  Precautions/Equipment   Precautions:  Weight bearing as tolerated    Current Braces/Orthoses:  None    Equipment Currently Used:  Rolling walker  Prior Functional Status     Physical limitation(s):  Patient reports limitations of 70% per [facility]  Current Functional Status:    disturbed sleep, limited bending, limited household activities, unsteady gait, limited walking tolerance and limited standing tolerance  Social Support:     Hand dominance:  Right    Communication Preference:  Verbal, written and visual  Treatments:     Previous treatment:  Surgery  Patient Goals:     Patient/Family goals for therapy:  Improved balance, improved ambulation, increased ROM and increased strength      Objective:       Range of Motion  Knee  Active ROM  Left Knee-Active ROM  Flexion: 100 degrees with pain  Extension: 0 degrees   Passive ROM  Left Knee-Passive ROM    Flexion: 105 degrees with pain  Extension: 0 degrees     Strength  Left Hip   Planes of Motion   Flexion: 4+  Extension: 4+  Abduction: 4  Adduction: 4+  Left Knee   Flexion: 4  Extension: 4-  Additional Strength Details   VMO R=5/5, L=3/5,      Tests     Functional Assessment   Lower Extremity Functional Scale  LEFS Score (out of 80): 8  Assessment: 90% funcitonal limitation  Additional Tests Details:     WOMAC 69/96=72% impairment                                  I attest that I have reviewed the above information.  Signed: Antonia Battiest, PT  07/17/2023 11:32 AM

## 2023-07-19 DIAGNOSIS — Z96652 Presence of left artificial knee joint: Principal | ICD-10-CM

## 2023-07-19 DIAGNOSIS — M1712 Unilateral primary osteoarthritis, left knee: Principal | ICD-10-CM

## 2023-07-19 DIAGNOSIS — Z471 Aftercare following joint replacement surgery: Principal | ICD-10-CM

## 2023-07-19 NOTE — Unmapped (Signed)
 [facility] PHYSICAL [facility]ERAP[facility]  OUTPATIENT PHYSICAL THERAPY  07/19/2023  Note Type: Treatment Note       Patient Name: Stacy Fox  Date of Birth:1960-03-20  Diagnosis:   Encounter Diagnoses   Name Primary?    Osteoarthritis of left knee, unspecified osteoarthritis type Yes    Aftercare following left knee joint replacement surgery      Referring MD:  Stacy Canal, PA     Date of Onset of Impairment-No date available  Date PT Care Plan Established or Reviewed-05/29/2023  Date PT Treatment Started-No date available   Visit Count: 16  Plan of Care Effective Date: 05/28/2023 - 07/28/2023     Visit 16/24 Re-evaluation due 07/28/23    Assessment/Plan:    Assessment  Assessment details:    Patient was progress with functional strengthening and stretching today and reported reduced pain after treatment session completed today. Will continue to progress with knee stretching and strengthening exercises as patient able to tolerate. Patient noted to be able to attain 108 degrees AAROM knee flexion today in supine.       Patient will continue to benefit from skilled PT intervention to address current body structure impairments and activity limitations to return to functions of community and household mobility.           Impairments: decreased range of motion, gait deviation, impaired flexibility, decreased mobility, decreased strength, fall risk, impaired balance and pain          Examination of Body Systems: musculoskeletal    Clinical Presentation: stable    Clinical Decision Making: moderate    Prognosis: good prognosis    Positive Prognosis Rationale: motivated for treatment and response to trial tx.      Therapy Goals      Goals:      GOALS:  Short-term Goals to be achieved in 3 weeks. Patient will:  1. Independent with home exercise program for continued self treatment. MET  2. Improve L knee AROM by 15 degrees for improved community mobility. MET  3. Improve LLE strength by ?? muscle grade for improved community mobility. MET  4. Improve WOMAC Functional Scale by 10pts. PROGRESSING  5. Improve L SLS > 15 SECONDS or better for community ambulation. PROGRESSING        Long-term Goals to be achieved in 8 weeks. Patient will:  1. Have LLE VMO strength 4+/5 or greater for normal community ambulation. PROGRESSING  2. Ambulate greater than 1047ft for normal community ambulation painfree. PROGRESSING  3. Ambulate 1 full flight of stairs at and independent level with reciprocal gait. PROGRESSING  4. Improve WOMAC Functional Scale 20pts. PROGRESSING  5. Improve squatting ability [facility] and painfree. PROGRESSING      Plan    Therapy options: will be seen for skilled physical therapy services    Planned therapy interventions: 97140-Manual Therapy, 97530-Therapeutic Activities, 97750-Physical Performance Test, 97116-Gait Training, 97110-Therapeutic Exercises, 97112-Neuromuscular Re-education, 97032, G0283-Electrical Stimulation (unattended, attended), 97016-Pneumatic Devices (compression pump) and 97010-Cold Packs/Hot Packs      Frequency: 2x week    Duration in weeks: 4    Education provided to: patient.    Education provided: HEP, Fall prevention and Symptom management    Education results: needs reinforcement and verbalized good understanding.    Communication/Consultation: Discussed with PTA and Initial note sent to Referring Provider.      Total Session Time: 71    Treatment rendered today:      Therapeutic Treatment  DOS 05/23/23  ROM flexion: 108 degrees in supine AAROM  ROM extension 0 degrees AROM    TE: 49 min.  -Bike for range of motion x8 minutes (seat #5 to #4) able to make full rotations retro  -Standing step stretch for flexion 3 x 30 sec  -Standing step stretch for extension 3 x 30 sec  -Standing TKE 60# 3 x 10 reps   -Leg press 3 x 10 100#  -Prone quadriceps stretch 5 x 30 holds   -Supine heel slides with strap 3 x 30 holds  -Supine hamstring stretch with 2# weight x2 minutes    97110 TE: Pt. was progressed to tolerance with left knee strengthening & stretching exercises as above to increase pt.'s ability to meet functional goals. Pt. required demonstration & min. VCs & tactile cues for proper exercise technique and to maintain upright posture.    TA: 6 min.    -Step-up at 8 step 3 x 10 reps    Patient required supervision and moderate verbal cues and demonstration for proper technique and to maintain proper posture throughout exercises.     Manual 6 min.  Patient given gentle grade III patellar and A/P tibiofemoral mobilizations to end-range with small oscillations in all directions with patient supine x6 minutes.     Modalities: x10 minutes  97016 Vasopneumatic Device: PT applied game ready to L knee on low pressure after TE to decrease pain & inflammation x 10 min. with left lower extremity elevated and pt. supine.      NOT performed today:  -Single leg stance on LLE x10 x 5 holds  -Prone knee flexion with use of sheet for overpressure into flexion with 5 sec holds x .  -Prone hangs x 3 min.  -standing w BUE support, hip flexion, abduction, extension w 30# resistance x 10 reps x 2 each BLEs  -slant board stretch 3 x 30 sec  -BOSU lunge and hold, 20 x 5 second holds RUE only   -Seated at mat table L knee flexion AAROM 10 x 10 holds  -Seated hamstring stretch 3 x 30 holds    Plan details: Modalities (heat, ice, ultrasound, electrical stimulation, iontophoresis), as needed for pain management. Massage, mobilization and manual therapy as needed to increase ROM in restricted soft tissues and joints. Therapeutic exercise, Neuromuscular Re-education, and education.      Subjective:     History of Present Condition     Date of surgery:  05/23/2023    History of Present Condition/Chief Complaint:  [name] who presents to PT s/p Left knee replacement. Patient presents WBAT with FWW, dressings intact. Patient with history of prior Right TKA.  Subjective:  Patient still reported 8/10 knee pain upon presentation again today and reported that her knee was still feeling stiff.      Quality of life:  Good  Pain:     Current pain rating:  8    At best pain rating:  5    At worst pain rating:  10  Location:  Circumferentially left knee    Quality:  Aching, tender and stiffness    Relieving factors:  Ice and rest    Aggravating factors:  As the day progresses, bending and performance of leg dominant activites  Precautions/Equipment   Precautions:  Weight bearing as tolerated    Current Braces/Orthoses:  None    Equipment Currently Used:  Rolling walker  Prior Functional Status     Physical limitation(s):  Patient reports limitations of  70% per [facility]  Current Functional Status:    disturbed sleep, limited bending, limited household activities, unsteady gait, limited walking tolerance and limited standing tolerance  Social Support:     Hand dominance:  Right    Communication Preference:  Verbal, written and visual  Treatments:     Previous treatment:  Surgery  Patient Goals:     Patient/Family goals for therapy:  Improved balance, improved ambulation, increased ROM and increased strength      Objective:       Range of Motion  Knee  Active ROM  Left Knee-Active ROM  Flexion: 100 degrees with pain  Extension: 0 degrees   Passive ROM  Left Knee-Passive ROM    Flexion: 105 degrees with pain  Extension: 0 degrees     Strength  Left Hip   Planes of Motion   Flexion: 4+  Extension: 4+  Abduction: 4  Adduction: 4+  Left Knee   Flexion: 4  Extension: 4-  Additional Strength Details   VMO R=5/5, L=3/5,      Tests     Functional Assessment   Lower Extremity Functional Scale  LEFS Score (out of 80): 8  Assessment: 90% funcitonal limitation  Additional Tests Details:     WOMAC 69/96=72% impairment                                  I attest that I have reviewed the above information.  Signed: Antonia Battiest, PT  07/19/2023 1:36 PM

## 2023-07-24 DIAGNOSIS — Z471 Aftercare following joint replacement surgery: Principal | ICD-10-CM

## 2023-07-24 DIAGNOSIS — M1712 Unilateral primary osteoarthritis, left knee: Principal | ICD-10-CM

## 2023-07-24 DIAGNOSIS — Z96652 Presence of left artificial knee joint: Principal | ICD-10-CM

## 2023-07-24 NOTE — Unmapped (Signed)
 [facility] PHYSICAL THERAPY [place]  OUTPATIENT PHYSICAL THERAPY  07/24/2023  Note Type: Treatment Note       Patient Name: Stacy Fox  Date of Birth:20-Jul-1960  Diagnosis:   Encounter Diagnoses   Name Primary?    Osteoarthritis of left knee, unspecified osteoarthritis type Yes    Aftercare following left knee joint replacement surgery      Referring MD:  Recardo Canal, PA     Date of Onset of Impairment-No date available  Date PT Care Plan Established or Reviewed-05/29/2023  Date PT Treatment Started-No date available   Visit Count: 17  Plan of Care Effective Date: 05/28/2023 - 07/28/2023     Visit 17/24 Re-evaluation due 07/28/23    Assessment/Plan:    Assessment  Assessment details:    Patient was progress with functional strengthening and stretching today and reported reduced pain after treatment session completed today. Will continue to progress with knee stretching and strengthening exercises as patient able to tolerate. Patient noted to be able to attain 110 degrees AAROM knee flexion today in long sitting.    Patient will continue to benefit from skilled PT intervention to address current body structure impairments and activity limitations to return to functions of community and household mobility.           Impairments: decreased range of motion, gait deviation, impaired flexibility, decreased mobility, decreased strength, fall risk, impaired balance and pain          Examination of Body Systems: musculoskeletal    Clinical Presentation: stable    Clinical Decision Making: moderate    Prognosis: good prognosis    Positive Prognosis Rationale: motivated for treatment and response to trial tx.      Therapy Goals      Goals:      GOALS:  Short-term Goals to be achieved in 3 weeks. Patient will:  1. Independent with home exercise program for continued self treatment. MET  2. Improve L knee AROM by 15 degrees for improved community mobility. MET  3. Improve LLE strength by ?? muscle grade for improved community mobility. MET  4. Improve WOMAC Functional Scale by 10pts. PROGRESSING  5. Improve L SLS > 15 SECONDS or better for community ambulation. PROGRESSING        Long-term Goals to be achieved in 8 weeks. Patient will:  1. Have LLE VMO strength 4+/5 or greater for normal community ambulation. PROGRESSING  2. Ambulate greater than 1038ft for normal community ambulation painfree. PROGRESSING  3. Ambulate 1 full flight of stairs at and independent level with reciprocal gait. PROGRESSING  4. Improve WOMAC Functional Scale 20pts. PROGRESSING  5. Improve squatting ability [facility] and painfree. PROGRESSING      Plan    Therapy options: will be seen for skilled physical therapy services    Planned therapy interventions: 97140-Manual Therapy, 97530-Therapeutic Activities, 97750-Physical Performance Test, 97116-Gait Training, 97110-Therapeutic Exercises, 97112-Neuromuscular Re-education, 97032, G0283-Electrical Stimulation (unattended, attended), 97016-Pneumatic Devices (compression pump) and 97010-Cold Packs/Hot Packs      Frequency: 2x week    Duration in weeks: 4    Education provided to: patient.    Education provided: HEP, Fall prevention and Symptom management    Education results: needs reinforcement and verbalized good understanding.    Communication/Consultation: Discussed POC with PT.      Total Session Time: 71    Treatment rendered today:      Therapeutic Treatment  DOS 05/23/23     ROM flexion: 110 degrees in long sitting AAROM  ROM extension 0 degrees AAROM    TE: 23 min.  -Bike for range of motion x10 minutes (seat #5 to #4) able to make full rotations fwd. 5 min retro  -Standing step stretch for flexion 20 holds 3 min (109).   -Long sitting stretch for extension 4 min 10 holds with yoga strap    97110 TE: Pt. was progressed to tolerance with left knee strengthening & stretching exercises as above to increase pt.'s ability to meet functional goals. Pt. required demonstration & min. VCs & tactile cues for proper exercise technique and to maintain upright posture.    TA: 5 min.  -Step-up 6 fwd/lateral/retro with BUE support 20x each    Patient required supervision and moderate verbal cues and demonstration for proper technique and to maintain proper posture throughout exercises.     Manual 20 min   Patient given gentle grade III patellar and A/P tibiofemoral mobilizations to end-range with small oscillations in all directions with patient supine x6 minutes.   -patella mobs  -crossfriction massage  -myofascial release of quads.      Modalities: x10 minutes  97016 Vasopneumatic Device: PT applied game ready to L knee on low pressure after TE to decrease pain & inflammation x 10 min. with left lower extremity elevated and pt. supine.      NOT performed today:  -Standing TKE 60# 3 x 10 reps   -Leg press 3 x 10 100#  -Prone quadriceps stretch 5 x 30 holds   -Supine heel slides with strap 3 x 30 holds  -Supine hamstring stretch with 2# weight x2 minutes  -Single leg stance on LLE x10 x 5 holds  -Prone knee flexion with use of sheet for overpressure into flexion with 5 sec holds x .  -Prone hangs x 3 min.  -standing w BUE support, hip flexion, abduction, extension w 30# resistance x 10 reps x 2 each BLEs  -slant board stretch 3 x 30 sec  -BOSU lunge and hold, 20 x 5 second holds RUE only   -Seated at mat table L knee flexion AAROM 10 x 10 holds  -Seated hamstring stretch 3 x 30 holds    Plan details: Modalities (heat, ice, ultrasound, electrical stimulation, iontophoresis), as needed for pain management. Massage, mobilization and manual therapy as needed to increase ROM in restricted soft tissues and joints. Therapeutic exercise, Neuromuscular Re-education, and education.      Subjective:     History of Present Condition     Date of surgery:  05/23/2023    History of Present Condition/Chief Complaint:  [name] who presents to PT s/p Left knee replacement. Patient presents WBAT with FWW, dressings intact. Patient with history of prior Right TKA.  Subjective:  Pt reports L knee pain upon arrival that started yesterday and has been relatively ongoing since.  Remains challenged with steep stairs at home and stiffness in the morning.  Indicates unable to continue bike riding at gym due to family emergency.       Quality of life:  Good  Pain:     Current pain rating:  8    At best pain rating:  5    At worst pain rating:  10  Location:  Circumferentially left knee    Quality:  Aching, tender and stiffness    Relieving factors:  Ice and rest    Aggravating factors:  As the day progresses, bending and performance of leg dominant activites  Precautions/Equipment   Precautions:  Weight bearing as tolerated    Current Braces/Orthoses:  None    Equipment Currently Used:  Rolling walker  Prior Functional Status     Physical limitation(s):  Patient reports limitations of 70% per [facility]  Current Functional Status:    disturbed sleep, limited bending, limited household activities, unsteady gait, limited walking tolerance and limited standing tolerance  Social Support:     Hand dominance:  Right    Communication Preference:  Verbal, written and visual  Treatments:     Previous treatment:  Surgery  Patient Goals:     Patient/Family goals for therapy:  Improved balance, improved ambulation, increased ROM and increased strength      Objective:       Range of Motion  Knee  Active ROM  Left Knee-Active ROM  Flexion: 100 degrees with pain  Extension: 0 degrees   Passive ROM  Left Knee-Passive ROM    Flexion: 105 degrees with pain  Extension: 0 degrees     Strength  Left Hip   Planes of Motion   Flexion: 4+  Extension: 4+  Abduction: 4  Adduction: 4+  Left Knee   Flexion: 4  Extension: 4-  Additional Strength Details   VMO R=5/5, L=3/5,      Tests     Functional Assessment   Lower Extremity Functional Scale  LEFS Score (out of 80): 8  Assessment: 90% funcitonal limitation  Additional Tests Details:     WOMAC 69/96=72% impairment Therapeutic Interventions Charges  $$ Therapeutic Activity [mins]: 5  $$ Therapeutic Exercise [mins]: 23  $$ Manual Therapy [mins]: 20     Physical Agent Modalities Charges  $$ Vasopneumatic Therapy [mins]: 10           I attest that I have reviewed the above information.  Signed: Belia Bowl, PTA  07/24/2023 2:36 PM

## 2023-07-26 DIAGNOSIS — M1712 Unilateral primary osteoarthritis, left knee: Principal | ICD-10-CM

## 2023-07-26 DIAGNOSIS — Z471 Aftercare following joint replacement surgery: Principal | ICD-10-CM

## 2023-07-26 DIAGNOSIS — Z96652 Presence of left artificial knee joint: Principal | ICD-10-CM

## 2023-07-26 NOTE — Unmapped (Signed)
 [facility] PHYSICAL THERAPY [place]  OUTPATIENT PHYSICAL THERAPY  07/26/2023  Note Type: Treatment Note       Patient Name: Stacy Fox  Date of Birth:01/10/1961  Diagnosis:   Encounter Diagnoses   Name Primary?    Osteoarthritis of left knee, unspecified osteoarthritis type Yes    Aftercare following left knee joint replacement surgery      Referring MD:  Recardo Canal, PA     Date of Onset of Impairment-No date available  Date PT Care Plan Established or Reviewed-05/29/2023  Date PT Treatment Started-No date available   Visit Count: 18  Plan of Care Effective Date: 05/28/2023 - 07/28/2023     Visit 17/24 Re-evaluation due 07/28/23    Assessment/Plan:    Assessment  Assessment details:    Patient was progress with functional strengthening and stretching today and reported reduced pain after treatment session completed today. Will continue to progress with knee stretching and strengthening exercises as patient able to tolerate. Patient noted to be able to attain 112 degrees PROM knee flexion today in long sitting.    Patient will continue to benefit from skilled PT intervention to address current body structure impairments and activity limitations to return to functions of community and household mobility.           Impairments: decreased range of motion, gait deviation, impaired flexibility, decreased mobility, decreased strength, fall risk, impaired balance and pain          Examination of Body Systems: musculoskeletal    Clinical Presentation: stable    Clinical Decision Making: moderate    Prognosis: good prognosis    Positive Prognosis Rationale: motivated for treatment and response to trial tx.      Therapy Goals      Goals:      GOALS:  Short-term Goals to be achieved in 3 weeks. Patient will:  1. Independent with home exercise program for continued self treatment. MET  2. Improve L knee AROM by 15 degrees for improved community mobility. MET  3. Improve LLE strength by ?? muscle grade for improved community mobility. MET  4. Improve WOMAC Functional Scale by 10pts. PROGRESSING  5. Improve L SLS > 15 SECONDS or better for community ambulation. PROGRESSING        Long-term Goals to be achieved in 8 weeks. Patient will:  1. Have LLE VMO strength 4+/5 or greater for normal community ambulation. PROGRESSING  2. Ambulate greater than 1061ft for normal community ambulation painfree. PROGRESSING  3. Ambulate 1 full flight of stairs at and independent level with reciprocal gait. PROGRESSING  4. Improve WOMAC Functional Scale 20pts. PROGRESSING  5. Improve squatting ability [facility] and painfree. PROGRESSING      Plan    Therapy options: will be seen for skilled physical therapy services    Planned therapy interventions: 97140-Manual Therapy, 97530-Therapeutic Activities, 97750-Physical Performance Test, 97116-Gait Training, 97110-Therapeutic Exercises, 97112-Neuromuscular Re-education, 97032, G0283-Electrical Stimulation (unattended, attended), 97016-Pneumatic Devices (compression pump) and 97010-Cold Packs/Hot Packs      Frequency: 2x week    Duration in weeks: 4    Education provided to: patient.    Education provided: HEP, Fall prevention and Symptom management    Education results: needs reinforcement and verbalized good understanding.    Communication/Consultation: N/A.      Total Session Time: 71    Treatment rendered today:      Therapeutic Treatment  DOS 05/23/23     ROM flexion: 112 degrees in sitting PROM  ROM extension 0  degrees AAROM    TE: 25 min.  -Recumbent bike performed to increase tissue warmth, increase bloodflow and mobility in order to prep pt for continued therapeutic intervention.  Pt to perform 10 min Lvl 4  -Standing step stretch for flexion 20 holds 3 min (109).   -Long sitting stretch for extension 4 min 10 holds with yoga strap    97110 TE: Pt. was progressed to tolerance with left knee strengthening & stretching exercises as above to increase pt.'s ability to meet functional goals. Pt. required demonstration & min. VCs & tactile cues for proper exercise technique and to maintain upright posture.    TA: 5 min.  -Step-up 6 fwd/lateral/retro with BUE support 20x each    Patient required supervision and moderate verbal cues and demonstration for proper technique and to maintain proper posture throughout exercises.     Manual 20 min   Patient given gentle grade III patellar and A/P tibiofemoral mobilizations to end-range with small oscillations in all directions with patient supine x6 minutes.   -patella mobs  -crossfriction massage  -myofascial release of quads.      Modalities: x10 minutes  97016 Vasopneumatic Device: PT applied game ready to L knee on low pressure after TE to decrease pain & inflammation x 10 min. with left lower extremity elevated and pt. supine.      NOT performed today:  -Standing TKE 60# 3 x 10 reps   -Leg press 3 x 10 100#  -Prone quadriceps stretch 5 x 30 holds   -Supine heel slides with strap 3 x 30 holds  -Supine hamstring stretch with 2# weight x2 minutes  -Single leg stance on LLE x10 x 5 holds  -Prone knee flexion with use of sheet for overpressure into flexion with 5 sec holds x .  -Prone hangs x 3 min.  -standing w BUE support, hip flexion, abduction, extension w 30# resistance x 10 reps x 2 each BLEs  -slant board stretch 3 x 30 sec  -BOSU lunge and hold, 20 x 5 second holds RUE only   -Seated at mat table L knee flexion AAROM 10 x 10 holds  -Seated hamstring stretch 3 x 30 holds    Plan details: Modalities (heat, ice, ultrasound, electrical stimulation, iontophoresis), as needed for pain management. Massage, mobilization and manual therapy as needed to increase ROM in restricted soft tissues and joints. Therapeutic exercise, Neuromuscular Re-education, and education.      Subjective:     History of Present Condition     Date of surgery:  05/23/2023    History of Present Condition/Chief Complaint:  [name] who presents to PT s/p Left knee replacement. Patient presents WBAT with FWW, dressings intact. Patient with history of prior Right TKA.  Subjective:  Returns to MD 5/28   Pt reports improved L knee pain upon arrival.  Remains challenged with steep stairs at home and stiffness in the morning.  Indicates unable to continue bike riding at gym due to family emergency.  Plans to ride at gym this weekend.       Quality of life:  Good  Pain:     Current pain rating:  8    At best pain rating:  5    At worst pain rating:  10  Location:  Circumferentially left knee    Quality:  Aching, tender and stiffness    Relieving factors:  Ice and rest    Aggravating factors:  As the day progresses, bending  and performance of leg dominant activites  Precautions/Equipment   Precautions:  Weight bearing as tolerated    Current Braces/Orthoses:  None    Equipment Currently Used:  Rolling walker  Prior Functional Status     Physical limitation(s):  Patient reports limitations of 70% per [facility]  Current Functional Status:    disturbed sleep, limited bending, limited household activities, unsteady gait, limited walking tolerance and limited standing tolerance  Social Support:     Hand dominance:  Right    Communication Preference:  Verbal, written and visual  Treatments:     Previous treatment:  Surgery  Patient Goals:     Patient/Family goals for therapy:  Improved balance, improved ambulation, increased ROM and increased strength      Objective:       Range of Motion  Knee  Active ROM  Left Knee-Active ROM  Flexion: 100 degrees with pain  Extension: 0 degrees   Passive ROM  Left Knee-Passive ROM    Flexion: 105 degrees with pain  Extension: 0 degrees     Strength  Left Hip   Planes of Motion   Flexion: 4+  Extension: 4+  Abduction: 4  Adduction: 4+  Left Knee   Flexion: 4  Extension: 4-  Additional Strength Details   VMO R=5/5, L=3/5,      Tests     Functional Assessment   Lower Extremity Functional Scale  LEFS Score (out of 80): 8  Assessment: 90% funcitonal limitation  Additional Tests Details:     WOMAC 69/96=72% impairment                Therapeutic Interventions Charges  $$ Therapeutic Activity [mins]: 5  $$ Therapeutic Exercise [mins]: 25  $$ Manual Therapy [mins]: 20     Physical Agent Modalities Charges  $$ Vasopneumatic Therapy [mins]: 10           I attest that I have reviewed the above information.  Signed: Belia Bowl, PTA  07/26/2023 1:15 PM

## 2023-07-31 ENCOUNTER — Ambulatory Visit: Admit: 2023-07-31 | Payer: Medicaid (Managed Care)

## 2023-07-31 ENCOUNTER — Ambulatory Visit: Admit: 2023-07-31 | Discharge: 2023-08-25 | Payer: Medicaid (Managed Care)

## 2023-07-31 DIAGNOSIS — M1712 Unilateral primary osteoarthritis, left knee: Principal | ICD-10-CM

## 2023-07-31 DIAGNOSIS — Z471 Aftercare following joint replacement surgery: Principal | ICD-10-CM

## 2023-07-31 DIAGNOSIS — Z96652 Presence of left artificial knee joint: Principal | ICD-10-CM

## 2023-07-31 NOTE — Unmapped (Signed)
 [facility]YS[facility]ERAP[facility] [place]  OUTPATIENT PHYSICAL THERAPY  07/31/2023  Note Type: Re-Evaluation       Patient Name: Stacy Fox  Date of Birth:20-Jun-1960  Diagnosis:   Encounter Diagnoses   Name Primary?    Osteoarthritis of left knee, unspecified osteoarthritis type Yes    Aftercare following left knee joint replacement surgery      Referring MD:  Recardo Canal, PA     Date of Onset of Impairment-No date available  Date PT Care Plan Established or Reviewed-05/29/2023  Date PT Treatment Started-No date available   Visit Count: 19  Plan of Care Effective Date: 07/31/2023 - 09/28/2023     Visit 19/27 Re-evaluation due 08/31/2023    Assessment/Plan:    Assessment  Assessment details:    Patient presents today with c/o increased pain and tightness R knee, demonstrates good response to PT, able to perform progression of ROM and strengthening exercises with report of no lasting increase in pain.  Will continue to progress with knee stretching and strengthening exercises as patient able to tolerate. PROM R knee 0-112 today in long sitting.  Pt has met 4 of 5 short term goals and demonstrates good progress toward all remaining goals.   Patient continues to require skilled PT intervention to address current body structure impairments and activity limitations to return to functions of community and household mobility. Recommend pt continue 2x/wk for an additional 4 weeks with plan to transition to HEP as pain decreases and function improves.          Impairments: decreased range of motion, gait deviation, impaired flexibility, decreased mobility, decreased strength, fall risk, impaired balance and pain          Examination of Body Systems: musculoskeletal    Clinical Presentation: stable    Clinical Decision Making: moderate    Prognosis: good prognosis    Positive Prognosis Rationale: motivated for treatment and response to trial tx.      Therapy Goals      Goals:      GOALS:  Short-term Goals to be achieved in 3 weeks. Patient will:  1. Independent with home exercise program for continued self treatment. MET  2. Improve L knee AROM by 15 degrees for improved community mobility. MET  3. Improve LLE strength by ?? muscle grade for improved community mobility. MET  4. Improve WOMAC Functional Scale by 10 pts. MET  5. Improve L SLS > 15 SECONDS or better for community ambulation. PROGRESSING        Long-term Goals to be achieved in 8 weeks. Patient will:  1. Have LLE VMO strength 4+/5 or greater for normal community ambulation. PROGRESSING  2. Ambulate greater than 1035ft for normal community ambulation painfree. PROGRESSING  3. Ambulate 1 full flight of stairs at and independent level with reciprocal gait. PROGRESSING  4. Improve WOMAC Functional Scale 20pts. PROGRESSING  5. Improve squatting ability [facility] and painfree. PROGRESSING      Plan    Therapy options: will be seen for skilled physical therapy services    Planned therapy interventions: 97140-Manual Therapy, 97530-Therapeutic Activities, 97750-Physical Performance Test, 97116-Gait Training, 97110-Therapeutic Exercises, 97112-Neuromuscular Re-education, 97032, G0283-Electrical Stimulation (unattended, attended), 97016-Pneumatic Devices (compression pump) and 97010-Cold Packs/Hot Packs      Frequency: 2x week    Duration in weeks: 4    Education provided to: patient.    Education provided: HEP, Fall prevention and Symptom management    Education results: needs reinforcement and verbalized good understanding.  Communication/Consultation: N/A.      Total Session Time: 71    Treatment rendered today:      Therapeutic Treatment  DOS 05/23/23     ROM flexion: 112 degrees in sitting PROM  ROM extension 0 degrees AAROM    TE: 22 min.  -Recumbent bike x 10 min Lvl 4  -Standing step stretch for flexion 20 sec holds x 10 (109).   -Slant board stretch 3 x 30 sec  -Standing TKE 60# 2 x 10 reps w 3 sec holds   -Reclined supported long sitting, L adductor stretch 2 x 30 sec holds  -Reclined supported long sitting, LLE heel slides to max flexion alternating w quad sets for full extension, min A     TA: 19 min.  -Step-ups 6 fwd/lateral/retro with BUE support 20x each  -Dynamic balance/gt w high step marching, tin soldiers, retro stepping, tandem gt 25' each w SBA  Patient required supervision and moderate verbal cues and demonstration for proper technique and to maintain proper posture throughout exercises.     Manual Therapy: 10 min   Applied gentle grade III patellar and A/P tibiofemoral mobilizations to end-range with small oscillations in all directions with patient supine    -patella mobs  -crossfriction massage  -myofascial release of quads      Modalities: x10 minutes  97016 Vasopneumatic Device: PT applied game ready to L knee on low pressure 5-15 mm Hg after TE to decrease pain & inflammation x 10 min. with left lower extremity elevated and pt in reclined supported long sitting.      NOT performed today:  -Leg press 3 x 10 100#  -Prone quadriceps stretch 5 x 30 holds   -Supine heel slides with strap 3 x 30 holds  -Single leg stance on LLE x10 x 5 holds  -standing w BUE support, hip flexion, abduction, extension w 30# resistance x 10 reps x 2 each BLEs  -BOSU lunge and hold, 20 x 5 second holds RUE only   -Seated at mat table L knee flexion AAROM 10 x 10 holds  -Seated hamstring stretch 3 x 30 holds    Plan details: Modalities (heat, ice, ultrasound, electrical stimulation, iontophoresis), as needed for pain management. Massage, mobilization and manual therapy as needed to increase ROM in restricted soft tissues and joints. Therapeutic exercise, Neuromuscular Re-education, and education.      Subjective:     History of Present Condition     Date of surgery:  05/23/2023    History of Present Condition/Chief Complaint:  [name] who presents to PT s/p Left knee replacement. Patient presents WBAT with FWW, dressings intact. Patient with history of prior Right TKA.  Subjective:  Returns to MD 5/28   07/31/23 Pt reports still feeling much tightness medial and lateral knee with attempts to bend the knee. Pt notes she went to the gym Saturday and did the recumbent bike and NuStep, felt fine over the weekend and yesterday but reports having increased knee pain today, had to take a pain pill this AM.  07/26/23 Pt reports improved L knee pain upon arrival.  Remains challenged with steep stairs at home and stiffness in the morning.  Indicates unable to continue bike riding at gym due to family emergency.  Plans to ride at gym this weekend.       Quality of life:  Good  Pain:     Current pain rating:  8    At best pain rating:  5    At worst pain rating:  10  Location:  Circumferentially left knee    Quality:  Aching, tender and stiffness    Relieving factors:  Ice and rest    Aggravating factors:  As the day progresses, bending and performance of leg dominant activites  Precautions/Equipment   Precautions:  Weight bearing as tolerated    Current Braces/Orthoses:  None    Equipment Currently Used:  Rolling walker  Prior Functional Status     Physical limitation(s):  Patient reports limitations of 70% per [facility]  Current Functional Status:    disturbed sleep, limited bending, limited household activities, unsteady gait, limited walking tolerance and limited standing tolerance  Social Support:     Hand dominance:  Right    Communication Preference:  Verbal, written and visual  Treatments:     Previous treatment:  Surgery  Patient Goals:     Patient/Family goals for therapy:  Improved balance, improved ambulation, increased ROM and increased strength      Objective:       Range of Motion  Knee  Active ROM  Left Knee-Active ROM  Flexion: 100 degrees with pain  Extension: 0 degrees   Passive ROM  Left Knee-Passive ROM    Flexion: 105 degrees with pain  Extension: 0 degrees     Strength  Left Hip   Planes of Motion   Flexion: 4+  Extension: 4+  Abduction: 4  Adduction: 4+  Left Knee Flexion: 4  Extension: 4-  Additional Strength Details   VMO R=5/5, L=3/5,      Tests     Functional Assessment   Lower Extremity Functional Scale  LEFS Score (out of 80): 8  Assessment: 90% funcitonal limitation  Functional Assessment Comments-WOMAC score = 62/96 indicating 65% functional impairment  Additional Tests Details:     WOMAC 69/96=72% impairment                Therapeutic Interventions Charges  $$ Therapeutic Activity [mins]: 19  $$ Therapeutic Exercise [mins]: 22  $$ Manual Therapy [mins]: 10     Physical Agent Modalities Charges  $$ Vasopneumatic Therapy [mins]: 10           I attest that I have reviewed the above information.  Signed: Gonzalo Lather, PT  07/31/2023 1:47 PM

## 2023-08-02 DIAGNOSIS — M1712 Unilateral primary osteoarthritis, left knee: Principal | ICD-10-CM

## 2023-08-02 DIAGNOSIS — Z471 Aftercare following joint replacement surgery: Principal | ICD-10-CM

## 2023-08-02 DIAGNOSIS — Z96652 Presence of left artificial knee joint: Principal | ICD-10-CM

## 2023-08-02 NOTE — Unmapped (Signed)
 [facility] PHYSICAL THERAPY [place]  OUTPATIENT PHYSICAL THERAPY  08/02/2023  Note Type: Treatment Note       Patient Name: Stacy Fox  Date of Birth:May 28, 1960  Diagnosis:   Encounter Diagnoses   Name Primary?    Osteoarthritis of left knee, unspecified osteoarthritis type Yes    Aftercare following left knee joint replacement surgery      Referring MD:  Recardo Canal, PA     Date of Onset of Impairment-No date available  Date PT Care Plan Established or Reviewed-07/31/2023  Date PT Treatment Started-No date available   Visit Count: 20  Plan of Care Effective Date: 07/31/2023 - 09/28/2023     Visit 20/27 Re-evaluation due 08/31/2023    Assessment/Plan:    Assessment  Assessment details:    Patient presented today with c/o increased tightness in R knee and was able to be progressed again with ROM and strengthening exercises with patient reporting reduced pain after session completed. Will continue to progress with knee stretching and strengthening exercises as patient able to tolerate. AAROM R knee 0-112 today in supine.       Pt. has met 4 of 5 short term goals and demonstrates good progress toward all remaining goals. Patient continues to require skilled PT intervention to address current body structure impairments and activity limitations to return to functions of community and household mobility. Recommend pt continue 2x/wk for an additional 4 weeks with plan to transition to HEP as pain decreases and function improves.      Impairments: decreased range of motion, gait deviation, impaired flexibility, decreased mobility, decreased strength, fall risk, impaired balance and pain        Prognosis: good prognosis        Examination of Body Systems: musculoskeletal    Clinical Decision Making: moderate    Positive Prognosis Rationale: motivated for treatment and response to trial tx.    Clinical Presentation: stable    Therapy Goals      Goals:      GOALS:  Short-term Goals to be achieved in 3 weeks. Patient will:  1. Independent with home exercise program for continued self treatment. MET  2. Improve L knee AROM by 15 degrees for improved community mobility. MET  3. Improve LLE strength by ?? muscle grade for improved community mobility. MET  4. Improve WOMAC Functional Scale by 10 pts. MET  5. Improve L SLS > 15 SECONDS or better for community ambulation. PROGRESSING        Long-term Goals to be achieved in 8 weeks. Patient will:  1. Have LLE VMO strength 4+/5 or greater for normal community ambulation. PROGRESSING  2. Ambulate greater than 1030ft for normal community ambulation painfree. PROGRESSING  3. Ambulate 1 full flight of stairs at and independent level with reciprocal gait. PROGRESSING  4. Improve WOMAC Functional Scale 20pts. PROGRESSING  5. Improve squatting ability [facility] and painfree. PROGRESSING    Plan    Therapy options: will be seen for skilled physical therapy services    Planned therapy interventions: 97140-Manual Therapy, 97530-Therapeutic Activities, 97750-Physical Performance Test, 97116-Gait Training, 97110-Therapeutic Exercises, 97112-Neuromuscular Re-education, 97032, G0283-Electrical Stimulation (unattended, attended), 97016-Pneumatic Devices (compression pump) and 97010-Cold Packs/Hot Packs      Frequency: 2x week    Duration in weeks: 4    Education provided to: patient.    Education provided: HEP, Fall prevention and Symptom management    Education results: needs reinforcement and verbalized good understanding.    Communication/Consultation: N/A.  Total Session Time: 64    Treatment rendered today:      Therapeutic Treatment  DOS 05/23/23     ROM flexion: 112 degrees in supine AAROM   ROM extension 0 degrees AAROM    TE: 25 min.  -Recumbent bike x 10 min Lvl 4  -Standing step stretch for flexion 20 sec holds x 10 (108 degrees).   -Leg press 3 x 10 100#  -Slant board stretch 3 x 30 sec  -Standing TKE 60# 2 x 10 reps w 3 sec holds   -Supine SLR 2 x 10 reps  -Supine heel slide (knee flexion) 5 x30  holds  -Prone quadriceps stretch 3 x 30 holds    97110 TE: Pt. was progressed to tolerance with left knee strengthening & stretching exercises as above to increase pt.'s ability to meet functional goals. Pt. required min. to mod. VCs & visual cues for proper exercise technique.     TA: 21 min.  -Step-ups 6 fwd with BUE support 30x   -Retrograde walking up incline 2 x 50 feet   -6 stair negotiation (4 steps) x5 reps  -7 stair negotiation (9 steps) x3 reps  -Sit to stand 2 x 10 from standard height chair    Patient required supervision and moderate verbal cues and demonstration for proper technique and to maintain proper posture throughout activities.     Manual Therapy: 8 min     -Patient was given deep tissue mobilization to left quadriceps musculature with patient in supine with multiple moderate trigger points throughout. Patient also given gentle cross-friction rub to distal L knee scar to patient tolerance.       Modalities: x10 minutes  97016 Vasopneumatic Device: PT applied game ready to L knee on low pressure 5-15 mm Hg after TE to decrease pain & inflammation x 10 min. with left lower extremity elevated and pt in reclined supported long sitting.      NOT performed today:    -Supine heel slides with strap 3 x 30 holds  -Single leg stance on LLE x10 x 5 holds  -standing w BUE support, hip flexion, abduction, extension w 30# resistance x 10 reps x 2 each BLEs  -BOSU lunge and hold, 20 x 5 second holds RUE only   -Seated at mat table L knee flexion AAROM 10 x 10 holds  -Seated hamstring stretch 3 x 30 holds  -Reclined supported long sitting, L adductor stretch 2 x 30 sec holds  -Dynamic balance/gt w high step marching, tin soldiers, retro stepping, tandem gt 25' each w SBA    Plan details: Modalities (heat, ice, ultrasound, electrical stimulation, iontophoresis), as needed for pain management. Massage, mobilization and manual therapy as needed to increase ROM in restricted soft tissues and joints. Therapeutic exercise, Neuromuscular Re-education, and education.      Subjective:     History of Present Condition     Date of surgery:  05/23/2023    History of Present Condition/Chief Complaint:  [name] who presents to PT s/p Left knee replacement. Patient presents WBAT with FWW, dressings intact. Patient with history of prior Right TKA.  Subjective:  08/02/2023: Patient reported slightly increased stiffness today and 5/10 pain upon presentation today.   07/31/23 Pt reports still feeling much tightness medial and lateral knee with attempts to bend the knee. Pt notes she went to the gym Saturday and did the recumbent bike and NuStep, felt fine over the weekend and yesterday but reports having  increased knee pain today, had to take a pain pill this AM.  07/26/23 Pt reports improved L knee pain upon arrival.  Remains challenged with steep stairs at home and stiffness in the morning.  Indicates unable to continue bike riding at gym due to family emergency.  Plans to ride at gym this weekend.       Quality of life:  Good  Pain:     Current pain rating:  5    At best pain rating:  5    At worst pain rating:  10  Location:  Circumferentially left knee    Quality:  Aching, tender and stiffness    Relieving factors:  Ice and rest    Aggravating factors:  As the day progresses, bending and performance of leg dominant activites  Precautions/Equipment   Precautions:  Weight bearing as tolerated    Current Braces/Orthoses:  None    Equipment Currently Used:  Rolling walker  Prior Functional Status     Physical limitation(s):  Patient reports limitations of 70% per [facility]  Current Functional Status:    disturbed sleep, limited bending, limited household activities, unsteady gait, limited walking tolerance and limited standing tolerance  Social Support:     Hand dominance:  Right    Communication Preference:  Verbal, written and visual  Treatments:     Previous treatment:  Surgery  Patient Goals: Patient/Family goals for therapy:  Improved balance, improved ambulation, increased ROM and increased strength      Objective:       Range of Motion  Knee  Active ROM  Left Knee-Active ROM  Flexion: 100 degrees with pain  Extension: 0 degrees   Passive ROM  Left Knee-Passive ROM    Flexion: 105 degrees with pain  Extension: 0 degrees     Strength  Left Hip   Planes of Motion   Flexion: 4+  Extension: 4+  Abduction: 4  Adduction: 4+  Left Knee   Flexion: 4  Extension: 4-  Additional Strength Details   VMO R=5/5, L=3/5,      Tests     Functional Assessment   Lower Extremity Functional Scale  LEFS Score (out of 80): 8  Assessment: 90% funcitonal limitation  Functional Assessment Comments-WOMAC score = 62/96 indicating 65% functional impairment  Additional Tests Details:     WOMAC 69/96=72% impairment                                  I attest that I have reviewed the above information.  Signed: Antonia Battiest, PT  08/02/2023 11:22 AM

## 2023-08-06 MED ORDER — OZEMPIC 2 MG/DOSE (8 MG/3 ML) SUBCUTANEOUS PEN INJECTOR
SUBCUTANEOUS | 4 refills | 28.00000 days
Start: 2023-08-06 — End: ?

## 2023-08-07 DIAGNOSIS — M1712 Unilateral primary osteoarthritis, left knee: Principal | ICD-10-CM

## 2023-08-07 DIAGNOSIS — Z96652 Presence of left artificial knee joint: Principal | ICD-10-CM

## 2023-08-07 DIAGNOSIS — Z471 Aftercare following joint replacement surgery: Principal | ICD-10-CM

## 2023-08-07 MED ORDER — OZEMPIC 2 MG/DOSE (8 MG/3 ML) SUBCUTANEOUS PEN INJECTOR
SUBCUTANEOUS | 4 refills | 28.00000 days | Status: CP
Start: 2023-08-07 — End: ?
  Filled 2023-08-08: qty 3, 28d supply, fill #0

## 2023-08-07 NOTE — Unmapped (Signed)
 [facility]YS[facility]ERAP[facility] [place]  OUTPATIENT PHYSICAL THERAPY  08/07/2023  Note Type: Treatment Note       Patient Name: Stacy Fox  Date of Birth:1960/03/24  Diagnosis:   Encounter Diagnoses   Name Primary?    Osteoarthritis of left knee, unspecified osteoarthritis type Yes    Aftercare following left knee joint replacement surgery      Referring MD:  Recardo Canal, PA     Date of Onset of Impairment-No date available  Date PT Care Plan Established or Reviewed-07/31/2023  Date PT Treatment Started-No date available   Visit Count: 21  Plan of Care Effective Date: 07/31/2023 - 09/28/2023     Visit 21/27 Re-evaluation due 08/31/2023    Assessment/Plan:    Assessment  Assessment details:    Patient was gently progressed again with L knee ROM and strengthening exercises with patient reporting reduced pain after session completed. Will continue to progress with knee stretching and strengthening exercises as patient able to tolerate. AAROM R knee 0-114 today in supine.       Pt. has met 4 of 5 short term goals and demonstrates good progress toward all remaining goals. Patient continues to require skilled PT intervention to address current body structure impairments and activity limitations to return to functions of community and household mobility. Recommend pt continue 2x/wk for an additional 4 weeks with plan to transition to HEP as pain decreases and function improves.      Impairments: decreased range of motion, gait deviation, impaired flexibility, decreased mobility, decreased strength, fall risk, impaired balance and pain        Prognosis: good prognosis        Examination of Body Systems: musculoskeletal    Clinical Decision Making: moderate    Positive Prognosis Rationale: motivated for treatment and response to trial tx.    Clinical Presentation: stable    Therapy Goals      Goals:      GOALS:  Short-term Goals to be achieved in 3 weeks. Patient will:  1. Independent with home exercise program for continued self treatment. MET  2. Improve L knee AROM by 15 degrees for improved community mobility. MET  3. Improve LLE strength by ?? muscle grade for improved community mobility. MET  4. Improve WOMAC Functional Scale by 10 pts. MET  5. Improve L SLS > 15 SECONDS or better for community ambulation. PROGRESSING        Long-term Goals to be achieved in 8 weeks. Patient will:  1. Have LLE VMO strength 4+/5 or greater for normal community ambulation. PROGRESSING  2. Ambulate greater than 1040ft for normal community ambulation painfree. PROGRESSING  3. Ambulate 1 full flight of stairs at and independent level with reciprocal gait. PROGRESSING  4. Improve WOMAC Functional Scale 20pts. PROGRESSING  5. Improve squatting ability [facility] and painfree. PROGRESSING    Plan    Therapy options: will be seen for skilled physical therapy services    Planned therapy interventions: 97140-Manual Therapy, 97530-Therapeutic Activities, 97750-Physical Performance Test, 97116-Gait Training, 97110-Therapeutic Exercises, 97112-Neuromuscular Re-education, 97032, G0283-Electrical Stimulation (unattended, attended), 97016-Pneumatic Devices (compression pump) and 97010-Cold Packs/Hot Packs      Frequency: 2x week    Duration in weeks: 4    Education provided to: patient.    Education provided: HEP, Fall prevention and Symptom management    Education results: needs reinforcement and verbalized good understanding.    Communication/Consultation: N/A.      Total Session Time: 65    Treatment rendered  today:      Therapeutic Treatment  DOS 05/23/23     ROM flexion: 114 degrees in supine AAROM   ROM extension 0 degrees AAROM    TE: 24 min.  -Recumbent bike x 10 min Lvl 4  -Standing step stretch for flexion 20 sec holds x 10   -Leg press 3 x 10 100#  -Slant board stretch 3 x 30 sec  -Standing TKE 70# 3 x 10 reps w 3 sec holds   -Supine heel slide (knee flexion) 5 x30  holds  -Prone quadriceps stretch 5 x 30 holds    97110 TE: Pt. was progressed to tolerance with left knee strengthening & stretching exercises as above to increase pt.'s ability to meet functional goals. Pt. required min. to mod. VCs & visual cues for proper exercise technique.     TA: 23 min.  -7 stair negotiation (9 steps) x4 reps  -Sit to stand 3 x 10 from standard height chair  -Standing marching on blue foam Airex  -BOSU lunge and hold, 20 x 5 second holds   -SLS with 5 holds 3 x 10 reps       Patient required supervision and moderate verbal cues and demonstration for proper technique and to maintain proper posture throughout activities.     Manual Therapy: 8 min     -Patient was given deep tissue mobilization to left quadriceps musculature with patient in supine with multiple moderate trigger points throughout. Patient also given gentle cross-friction rub to distal L knee scar to patient tolerance.       Modalities: x10 minutes  97016 Vasopneumatic Device: PT applied game ready to L knee on low pressure 5-15 mm Hg after TE to decrease pain & inflammation x 10 min. with left lower extremity elevated and pt in reclined supported long sitting.    Plan details: Modalities (heat, ice, ultrasound, electrical stimulation, iontophoresis), as needed for pain management. Massage, mobilization and manual therapy as needed to increase ROM in restricted soft tissues and joints. Therapeutic exercise, Neuromuscular Re-education, and education.      Subjective:     History of Present Condition     Date of surgery:  05/23/2023    History of Present Condition/Chief Complaint:  [name] who presents to PT s/p Left knee replacement. Patient presents WBAT with FWW, dressings intact. Patient with history of prior Right TKA.  Subjective:  08/07/2023: Patient reported feeling better overall, but still feeling knee stiffness. Patient reported negotiating a lot of stairs over the past several days.  08/02/2023: Patient reported slightly increased stiffness today and 5/10 pain upon presentation today.   07/31/23 Pt reports still feeling much tightness medial and lateral knee with attempts to bend the knee. Pt notes she went to the gym Saturday and did the recumbent bike and NuStep, felt fine over the weekend and yesterday but reports having increased knee pain today, had to take a pain pill this AM.  07/26/23 Pt reports improved L knee pain upon arrival.  Remains challenged with steep stairs at home and stiffness in the morning.  Indicates unable to continue bike riding at gym due to family emergency.  Plans to ride at gym this weekend.       Quality of life:  Good  Pain:     Current pain rating:  5    At best pain rating:  5    At worst pain rating:  10  Location:  Circumferentially left knee  Quality:  Aching, tender and stiffness    Relieving factors:  Ice and rest    Aggravating factors:  As the day progresses, bending and performance of leg dominant activites  Precautions/Equipment   Precautions:  Weight bearing as tolerated    Current Braces/Orthoses:  None    Equipment Currently Used:  Rolling walker  Prior Functional Status     Physical limitation(s):  Patient reports limitations of 70% per [facility]  Current Functional Status:    disturbed sleep, limited bending, limited household activities, unsteady gait, limited walking tolerance and limited standing tolerance  Social Support:     Hand dominance:  Right    Communication Preference:  Verbal, written and visual  Treatments:     Previous treatment:  Surgery  Patient Goals:     Patient/Family goals for therapy:  Improved balance, improved ambulation, increased ROM and increased strength      Objective:       Range of Motion  Knee  Active ROM  Left Knee-Active ROM  Flexion: 100 degrees with pain  Extension: 0 degrees   Passive ROM  Left Knee-Passive ROM    Flexion: 105 degrees with pain  Extension: 0 degrees     Strength  Left Hip   Planes of Motion   Flexion: 4+  Extension: 4+  Abduction: 4  Adduction: 4+  Left Knee   Flexion: 4  Extension: 4-  Additional Strength Details   VMO R=5/5, L=3/5,      Tests     Functional Assessment   Lower Extremity Functional Scale  LEFS Score (out of 80): 8  Assessment: 90% funcitonal limitation  Functional Assessment Comments-WOMAC score = 62/96 indicating 65% functional impairment  Additional Tests Details:     WOMAC 69/96=72% impairment                                  I attest that I have reviewed the above information.  Signed: Antonia Battiest, PT  08/07/2023 10:05 AM

## 2023-08-09 DIAGNOSIS — Z471 Aftercare following joint replacement surgery: Principal | ICD-10-CM

## 2023-08-09 DIAGNOSIS — Z96652 Presence of left artificial knee joint: Principal | ICD-10-CM

## 2023-08-09 DIAGNOSIS — M1712 Unilateral primary osteoarthritis, left knee: Principal | ICD-10-CM

## 2023-08-09 NOTE — Unmapped (Signed)
 [facility] PHYSICAL THERAPY [place]  OUTPATIENT PHYSICAL THERAPY  08/09/2023  Note Type: Treatment Note       Patient Name: Stacy Fox  Date of Birth:[birth year]  Diagnosis:   Encounter Diagnoses   Name Primary?    Osteoarthritis of left knee, unspecified osteoarthritis type Yes    Aftercare following left knee joint replacement surgery      Referring MD:  Recardo Canal, PA     Date of Onset of Impairment-No date available  Date PT Care Plan Established or Reviewed-07/31/2023  Date PT Treatment Started-No date available   Visit Count: 22  Plan of Care Effective Date: 07/31/2023 - 09/28/2023     Visit 22/27 Re-evaluation due 08/31/2023    Assessment/Plan:    Assessment  Assessment details:    Patient responded well to progression of L knee ROM and strengthening exercises with patient reporting reduced pain after session completed. Will continue to progress with knee stretching and strengthening exercises as patient able to tolerate. AAROM R knee 0-115 today in supine. Patient's physician noted to recommend an additional 4 weeks of PT treatment.       Pt. has met 4 of 5 short term goals and demonstrates good progress toward all remaining goals. Patient continues to require skilled PT intervention to address current body structure impairments and activity limitations to return to functions of community and household mobility. Recommend pt. continue 2x/wk for an additional 4 weeks with plan to transition to HEP as pain decreases and function improves.      Impairments: decreased range of motion, gait deviation, impaired flexibility, decreased mobility, decreased strength, fall risk, impaired balance and pain        Prognosis: good prognosis        Examination of Body Systems: musculoskeletal    Clinical Decision Making: moderate    Positive Prognosis Rationale: motivated for treatment and response to trial tx.    Clinical Presentation: stable    Therapy Goals      Goals:      GOALS:  Short-term Goals to be achieved in 3 weeks. Patient will:  1. Independent with home exercise program for continued self treatment. MET  2. Improve L knee AROM by 15 degrees for improved community mobility. MET  3. Improve LLE strength by ?? muscle grade for improved community mobility. MET  4. Improve WOMAC Functional Scale by 10 pts. MET  5. Improve L SLS > 15 SECONDS or better for community ambulation. PROGRESSING        Long-term Goals to be achieved in 8 weeks. Patient will:  1. Have LLE VMO strength 4+/5 or greater for normal community ambulation. PROGRESSING  2. Ambulate greater than 1052ft for normal community ambulation painfree. PROGRESSING  3. Ambulate 1 full flight of stairs at and independent level with reciprocal gait. PROGRESSING  4. Improve WOMAC Functional Scale 20pts. PROGRESSING  5. Improve squatting ability [facility] and painfree. PROGRESSING    Plan    Therapy options: will be seen for skilled physical therapy services    Planned therapy interventions: 97140-Manual Therapy, 97530-Therapeutic Activities, 97750-Physical Performance Test, 97116-Gait Training, 97110-Therapeutic Exercises, 97112-Neuromuscular Re-education, 97032, G0283-Electrical Stimulation (unattended, attended), 97016-Pneumatic Devices (compression pump) and 97010-Cold Packs/Hot Packs      Frequency: 2x week    Duration in weeks: 4    Education provided to: patient.    Education provided: HEP, Fall prevention and Symptom management    Education results: needs reinforcement and verbalized good understanding.    Communication/Consultation: N/A.  Total Session Time: 75    Treatment rendered today:      Therapeutic Treatment  DOS 05/23/23     ROM flexion: 115 degrees in supine AAROM   ROM extension 0 degrees AAROM    TE: 24 min.  -Recumbent bike x 10 min Lvl 4  -Standing step stretch for flexion 20 sec holds x 10   -Supine heel slide (knee flexion) 5 x30  holds  -Prone quadriceps stretch 5 x 30 holds    97110 TE: Pt. was progressed to tolerance with left knee strengthening & stretching exercises as above to increase pt.'s ability to meet functional goals. Pt. required min. to mod. VCs & visual cues for proper exercise technique.     TA: 28 min.    -7 stair negotiation (9 steps) x4 reps  -Sit to stand 2 x 10 from standard height chair  -Step-up with eccentric lowering 3 x 10 reps  -Standing heel-toe con taps on blue AirEx x3 minutes (alternating lower extremities)  -SLS with 10 holds x 10 reps   -BOSU lunge and hold, 20 x 5 second holds     Patient required supervision and moderate verbal cues and demonstration for proper technique and to maintain proper posture throughout activities.     Manual Therapy: 8 min     -Patient was given deep tissue mobilization to left quadriceps musculature with patient in supine with multiple moderate trigger points throughout. Patient also given gentle cross-friction rub to distal L knee scar to patient tolerance.       Modalities: x15 minutes  97016 Vasopneumatic Device: PT applied game ready to L knee on low pressure 5-15 mm Hg after TE to decrease pain & inflammation x 15 min. with left lower extremity elevated and patient in supine.    Plan details: Modalities (heat, ice, ultrasound, electrical stimulation, iontophoresis), as needed for pain management. Massage, mobilization and manual therapy as needed to increase ROM in restricted soft tissues and joints. Therapeutic exercise, Neuromuscular Re-education, and education.      Subjective:     History of Present Condition     Date of surgery:  05/23/2023    History of Present Condition/Chief Complaint:  [name] who presents to PT s/p Left knee replacement. Patient presents WBAT with FWW, dressings intact. Patient with history of prior Right TKA.  Subjective:  08/07/2023: Patient reported feeling better overall, but still feeling knee stiffness. Patient reported negotiating a lot of stairs over the past several days.  08/02/2023: Patient reported slightly increased stiffness today and 5/10 pain upon presentation today.   07/31/23 Pt reports still feeling much tightness medial and lateral knee with attempts to bend the knee. Pt notes she went to the gym Saturday and did the recumbent bike and NuStep, felt fine over the weekend and yesterday but reports having increased knee pain today, had to take a pain pill this AM.  07/26/23 Pt reports improved L knee pain upon arrival.  Remains challenged with steep stairs at home and stiffness in the morning.  Indicates unable to continue bike riding at gym due to family emergency.  Plans to ride at gym this weekend.       Quality of life:  Good  Pain:     Current pain rating:  5    At best pain rating:  5    At worst pain rating:  10  Location:  Circumferentially left knee    Quality:  Aching, tender and stiffness  Relieving factors:  Ice and rest    Aggravating factors:  As the day progresses, bending and performance of leg dominant activites  Precautions/Equipment   Precautions:  Weight bearing as tolerated    Current Braces/Orthoses:  None    Equipment Currently Used:  Rolling walker  Prior Functional Status     Physical limitation(s):  Patient reports limitations of 70% per [facility]  Current Functional Status:    disturbed sleep, limited bending, limited household activities, unsteady gait, limited walking tolerance and limited standing tolerance  Social Support:     Hand dominance:  Right    Communication Preference:  Verbal, written and visual  Treatments:     Previous treatment:  Surgery  Patient Goals:     Patient/Family goals for therapy:  Improved balance, improved ambulation, increased ROM and increased strength      Objective:       Range of Motion  Knee  Active ROM  Left Knee-Active ROM  Flexion: 100 degrees with pain  Extension: 0 degrees   Passive ROM  Left Knee-Passive ROM    Flexion: 105 degrees with pain  Extension: 0 degrees     Strength  Left Hip   Planes of Motion   Flexion: 4+  Extension: 4+  Abduction: 4  Adduction: 4+  Left Knee Flexion: 4  Extension: 4-  Additional Strength Details   VMO R=5/5, L=3/5,      Tests     Functional Assessment   Lower Extremity Functional Scale  LEFS Score (out of 80): 8  Assessment: 90% funcitonal limitation  Functional Assessment Comments-WOMAC score = 62/96 indicating 65% functional impairment  Additional Tests Details:     WOMAC 69/96=72% impairment                                  I attest that I have reviewed the above information.  Signed: Antonia Battiest, PT  08/09/2023 2:21 PM

## 2023-08-14 DIAGNOSIS — Z471 Aftercare following joint replacement surgery: Principal | ICD-10-CM

## 2023-08-14 DIAGNOSIS — M1712 Unilateral primary osteoarthritis, left knee: Principal | ICD-10-CM

## 2023-08-14 DIAGNOSIS — Z96652 Presence of left artificial knee joint: Principal | ICD-10-CM

## 2023-08-14 MED FILL — ROSUVASTATIN 20 MG TABLET: ORAL | 90 days supply | Qty: 90 | Fill #1

## 2023-08-14 NOTE — Unmapped (Signed)
 [facility]YSICAL [facility]ERAP[facility] [place]  OUTPATIENT PHYSICAL THERAPY  08/14/2023  Note Type: Treatment Note       Patient Name: Stacy Fox  Date of Birth:Aug 21, 1960  Diagnosis:   Encounter Diagnoses   Name Primary?    Osteoarthritis of left knee, unspecified osteoarthritis type Yes    Aftercare following left knee joint replacement surgery      Referring MD:  Recardo Canal, PA     Date of Onset of Impairment-No date available  Date PT Care Plan Established or Reviewed-07/31/2023  Date PT Treatment Started-No date available   Visit Count: 23  Plan of Care Effective Date: 07/31/2023 - 09/28/2023     Visit 23/27 Re-evaluation due 08/31/2023    Assessment/Plan:    Assessment  Assessment details:    Patient responded well to progression of L knee ROM and strengthening exercises with patient reporting reduced pain after session completed. Will continue to progress with knee stretching and strengthening exercises as patient able to tolerate. AAROM R knee 0-105 today in supine. And PROM of 107 degrees.      Patient's physician noted to recommend an additional 4 weeks of PT treatment.     Pt. has met 4 of 5 short term goals and demonstrates good progress toward all remaining goals. Patient continues to require skilled PT intervention to address current body structure impairments and activity limitations to return to functions of community and household mobility. Recommend pt. continue 2x/wk for an additional 4 weeks with plan to transition to HEP as pain decreases and function improves.      Impairments: decreased range of motion, gait deviation, impaired flexibility, decreased mobility, decreased strength, fall risk, impaired balance and pain        Prognosis: good prognosis        Examination of Body Systems: musculoskeletal    Clinical Decision Making: moderate    Positive Prognosis Rationale: motivated for treatment and response to trial tx.    Clinical Presentation: stable    Therapy Goals      Goals: GOALS:  Short-term Goals to be achieved in 3 weeks. Patient will:  1. Independent with home exercise program for continued self treatment. MET  2. Improve L knee AROM by 15 degrees for improved community mobility. MET  3. Improve LLE strength by ?? muscle grade for improved community mobility. MET  4. Improve WOMAC Functional Scale by 10 pts. MET  5. Improve L SLS > 15 SECONDS or better for community ambulation. PROGRESSING        Long-term Goals to be achieved in 8 weeks. Patient will:  1. Have LLE VMO strength 4+/5 or greater for normal community ambulation. PROGRESSING  2. Ambulate greater than 1059ft for normal community ambulation painfree. PROGRESSING  3. Ambulate 1 full flight of stairs at and independent level with reciprocal gait. PROGRESSING  4. Improve WOMAC Functional Scale 20pts. PROGRESSING  5. Improve squatting ability [facility] and painfree. PROGRESSING    Plan    Therapy options: will be seen for skilled physical therapy services    Planned therapy interventions: 97140-Manual Therapy, 97530-Therapeutic Activities, 97750-Physical Performance Test, 97116-Gait Training, 97110-Therapeutic Exercises, 97112-Neuromuscular Re-education, 97032, G0283-Electrical Stimulation (unattended, attended), 97016-Pneumatic Devices (compression pump) and 97010-Cold Packs/Hot Packs      Frequency: 2x week    Duration in weeks: 4    Education provided to: patient.    Education provided: HEP, Fall prevention and Symptom management    Education results: needs reinforcement and verbalized good understanding.    Communication/Consultation:  N/A.      Total Session Time: 75    Treatment rendered today:      Therapeutic Treatment  DOS 05/23/23     ROM flexion: 105 degrees in supine AAROM (decreased ROM value from previous session (115)).  ROM extension 0 degrees AAROM    TE: 35 min.  -Recumbent bike x 10 min Lvl 4  -Standing step stretch for flexion 20 sec holds x 10   -Supine heel slide (knee flexion) 5 x30  holds  -Prone quadriceps stretch 5 x 30 holds (with slight overpressure).   -supine knee FL with feet on red physio ball.      16109 TE: Pt. was progressed to tolerance with left knee strengthening & stretching exercises as above to increase pt.'s ability to meet functional goals. Pt. required min. to mod. VCs & visual cues for proper exercise technique.     TA:  not performed .  -7 stair negotiation (9 steps) x4 reps  -Sit to stand 2 x 10 from standard height chair  -Step-up with eccentric lowering 3 x 10 reps  -Standing heel-toe con taps on blue AirEx x3 minutes (alternating lower extremities)  -SLS with 10 holds x 10 reps   -BOSU lunge and hold, 20 x 5 second holds     Patient required supervision and moderate verbal cues and demonstration for proper technique and to maintain proper posture throughout activities.     Manual Therapy: 25 min     -Patient was given deep tissue mobilization to left quadriceps musculature with patient in supine with multiple moderate trigger points throughout.   -Patient also given gentle cross-friction rub to proximal and distal L knee scar to patient tolerance.     -IASTM along lateral thigh and knee.    -patella mobs.      Modalities: x 10 min   -CP 10 min in long sitting to offset soreness.      Plan details: Modalities (heat, ice, ultrasound, electrical stimulation, iontophoresis), as needed for pain management. Massage, mobilization and manual therapy as needed to increase ROM in restricted soft tissues and joints. Therapeutic exercise, Neuromuscular Re-education, and education.      Subjective:     History of Present Condition     Date of surgery:  05/23/2023    History of Present Condition/Chief Complaint:  [name] who presents to PT s/p Left knee replacement. Patient presents WBAT with FWW, dressings intact. Patient with history of prior Right TKA.  Subjective:  6/3:  Pt reports doing well overall, was on her feet more over the past day.    08/07/2023: Patient reported feeling better overall, but still feeling knee stiffness. Patient reported negotiating a lot of stairs over the past several days.  08/02/2023: Patient reported slightly increased stiffness today and 5/10 pain upon presentation today.   07/31/23 Pt reports still feeling much tightness medial and lateral knee with attempts to bend the knee. Pt notes she went to the gym Saturday and did the recumbent bike and NuStep, felt fine over the weekend and yesterday but reports having increased knee pain today, had to take a pain pill this AM.  07/26/23 Pt reports improved L knee pain upon arrival.  Remains challenged with steep stairs at home and stiffness in the morning.  Indicates unable to continue bike riding at gym due to family emergency.  Plans to ride at gym this weekend.       Quality of life:  Good  Pain:  Current pain rating:  5    At best pain rating:  5    At worst pain rating:  10  Location:  Circumferentially left knee    Quality:  Aching, tender and stiffness    Relieving factors:  Ice and rest    Aggravating factors:  As the day progresses, bending and performance of leg dominant activites  Precautions/Equipment   Precautions:  Weight bearing as tolerated    Current Braces/Orthoses:  None    Equipment Currently Used:  Rolling walker  Prior Functional Status     Physical limitation(s):  Patient reports limitations of 70% per [facility]  Current Functional Status:    disturbed sleep, limited bending, limited household activities, unsteady gait, limited walking tolerance and limited standing tolerance  Social Support:     Hand dominance:  Right    Communication Preference:  Verbal, written and visual  Treatments:     Previous treatment:  Surgery  Patient Goals:     Patient/Family goals for therapy:  Improved balance, improved ambulation, increased ROM and increased strength      Objective:       Range of Motion  Knee  Active ROM  Left Knee-Active ROM  Flexion: 100 degrees with pain  Extension: 0 degrees   Passive ROM  Left Knee-Passive ROM    Flexion: 105 degrees with pain  Extension: 0 degrees     Strength  Left Hip   Planes of Motion   Flexion: 4+  Extension: 4+  Abduction: 4  Adduction: 4+  Left Knee   Flexion: 4  Extension: 4-  Additional Strength Details   VMO R=5/5, L=3/5,      Tests     Functional Assessment   Lower Extremity Functional Scale  LEFS Score (out of 80): 8  Assessment: 90% funcitonal limitation  Functional Assessment Comments-WOMAC score = 62/96 indicating 65% functional impairment  Additional Tests Details:     WOMAC 69/96=72% impairment                Therapeutic Interventions Charges  $$ Therapeutic Exercise [mins]: 35  $$ Manual Therapy [mins]: 25                 I attest that I have reviewed the above information.  Signed: Belia Bowl, PTA  08/14/2023 1:59 PM

## 2023-08-17 NOTE — Unmapped (Signed)
 [facility] PHYSICAL THERAPY [place]  OUTPATIENT PHYSICAL THERAPY  08/16/2023  Note Type: Treatment Note       Patient Name: Stacy Fox  Date of Birth:03-28-1960  Diagnosis:   Encounter Diagnoses   Name Primary?    Osteoarthritis of left knee, unspecified osteoarthritis type Yes    Aftercare following left knee joint replacement surgery      Referring MD:  Recardo Canal, PA     Date of Onset of Impairment-No date available  Date PT Care Plan Established or Reviewed-07/31/2023  Date PT Treatment Started-No date available   Visit Count: 24  Plan of Care Effective Date: 07/31/2023 - 09/28/2023     Visit 24/27 Re-evaluation due 08/31/2023    Assessment/Plan:    Assessment  Assessment details:    Patient responded well to progression of L knee ROM stretching and strengthening exercises by intermixing quad/hip flexor stretching with strengthening activities. Will continue to progress with knee stretching and strengthening exercises as patient able to tolerate. PROM R knee 0-118 today in supine with Pts overpressure as measured after warm up and pre TE.     Patient's physician noted to recommend an additional 4 weeks of PT treatment.     Pt. has met 4 of 5 short term goals and demonstrates good progress toward all remaining goals. Patient continues to require skilled PT intervention to address current body structure impairments and activity limitations to return to functions of community and household mobility. Recommend pt. continue 2x/wk for an additional 4 weeks with plan to transition to HEP as pain decreases and function improves.      Impairments: decreased range of motion, gait deviation, impaired flexibility, decreased mobility, decreased strength, fall risk, impaired balance and pain        Prognosis: good prognosis        Examination of Body Systems: musculoskeletal    Clinical Decision Making: moderate    Positive Prognosis Rationale: motivated for treatment and response to trial tx.    Clinical Presentation: stable    Therapy Goals      Goals:      GOALS:  Short-term Goals to be achieved in 3 weeks. Patient will:  1. Independent with home exercise program for continued self treatment. MET  2. Improve L knee AROM by 15 degrees for improved community mobility. MET  3. Improve LLE strength by ?? muscle grade for improved community mobility. MET  4. Improve WOMAC Functional Scale by 10 pts. MET  5. Improve L SLS > 15 SECONDS or better for community ambulation. PROGRESSING        Long-term Goals to be achieved in 8 weeks. Patient will:  1. Have LLE VMO strength 4+/5 or greater for normal community ambulation. PROGRESSING  2. Ambulate greater than 1019ft for normal community ambulation painfree. PROGRESSING  3. Ambulate 1 full flight of stairs at and independent level with reciprocal gait. PROGRESSING  4. Improve WOMAC Functional Scale 20pts. PROGRESSING  5. Improve squatting ability [facility] and painfree. PROGRESSING    Plan    Therapy options: will be seen for skilled physical therapy services    Planned therapy interventions: 97140-Manual Therapy, 97530-Therapeutic Activities, 97750-Physical Performance Test, 97116-Gait Training, 97110-Therapeutic Exercises, 97112-Neuromuscular Re-education, 97032, G0283-Electrical Stimulation (unattended, attended), 97016-Pneumatic Devices (compression pump) and 97010-Cold Packs/Hot Packs      Frequency: 2x week    Duration in weeks: 4    Education provided to: patient.    Education provided: HEP, Fall prevention and Symptom management    Education  results: needs reinforcement and verbalized good understanding.    Communication/Consultation: N/A.      Total Session Time: 75    Treatment rendered today:      Therapeutic Treatment  DOS 05/23/23     ROM flexion: 118 degrees in supine   ROM extension 0 degrees AAROM    TE: 35 min.  -Recumbent bike x 10 min Lvl 5  -Standing step stretch for flexion 20 sec holds x 10   -Prone quadriceps stretch with use of strap 5 x 30 holds (with slight overpressure).   -supine knee FL with feet on 55 cm theraball with 5 sec holds end range flexion continuous x 3 min.  -supine B heels on 55 cm theraball, bridging/knee extension with hold x 5 sec x 3 min continuous  -Sidelying quad/hip flexor stretch with use of strap, 5 x 20 sec  -Prone B ankles on bolster, TKE with hold 30 x 5 sec    TA:  22 min.  -Gait activities: heel walks, toe walks, high knees, butt kicks, tin soldier, lateral sumo squat, each 2 x 30 feet  -Seated in chair, slide LLE under chair with foot flat on floor, use B UE to lift body up and forward for increased knee flexion stretch, 10 x 5 sec hold  -7 stair negotiation (9 steps) x4 reps  -Sit to stand 2 x 10 from standard height chair with airex under feet  -Step downs with eccentric lowering of LLE 3 x 10 reps    Patient required supervision and moderate verbal cues and demonstration for proper technique and to maintain proper posture throughout activities.     Manual Therapy: 8 min     -Patient seated, deep tissue mobilization to left quadriceps musculature .   -Patient seated, gentle cross-friction rub to proximal and distal L knee scar to patient tolerance.     -Patient seated, IASTM along lateral thigh and knee.    -Patient seated, patella mobs.      Modalities: x 10 min   -CP 10 min in long sitting to offset soreness.      Plan details: Modalities (heat, ice, ultrasound, electrical stimulation, iontophoresis), as needed for pain management. Massage, mobilization and manual therapy as needed to increase ROM in restricted soft tissues and joints. Therapeutic exercise, Neuromuscular Re-education, and education.      Subjective:     History of Present Condition     Date of surgery:  05/23/2023    History of Present Condition/Chief Complaint:  [name] who presents to PT s/p Left knee replacement. Patient presents WBAT with FWW, dressings intact. Patient with history of prior Right TKA.  Subjective:  6/5 Pt reports she has now started doing exercises in pool at home  6/3:  Pt reports doing well overall, was on her feet more over the past day.    08/07/2023: Patient reported feeling better overall, but still feeling knee stiffness. Patient reported negotiating a lot of stairs over the past several days.  08/02/2023: Patient reported slightly increased stiffness today and 5/10 pain upon presentation today.   07/31/23 Pt reports still feeling much tightness medial and lateral knee with attempts to bend the knee. Pt notes she went to the gym Saturday and did the recumbent bike and NuStep, felt fine over the weekend and yesterday but reports having increased knee pain today, had to take a pain pill this AM.  07/26/23 Pt reports improved L knee pain upon arrival.  Remains challenged  with steep stairs at home and stiffness in the morning.  Indicates unable to continue bike riding at gym due to family emergency.  Plans to ride at gym this weekend.       Quality of life:  Good  Pain:     Current pain rating:  5    At best pain rating:  5    At worst pain rating:  10  Location:  Circumferentially left knee    Quality:  Aching, tender and stiffness    Relieving factors:  Ice and rest    Aggravating factors:  As the day progresses, bending and performance of leg dominant activites  Precautions/Equipment   Precautions:  Weight bearing as tolerated    Current Braces/Orthoses:  None    Equipment Currently Used:  Rolling walker  Prior Functional Status     Physical limitation(s):  Patient reports limitations of 70% per [facility]  Current Functional Status:    disturbed sleep, limited bending, limited household activities, unsteady gait, limited walking tolerance and limited standing tolerance  Social Support:     Hand dominance:  Right    Communication Preference:  Verbal, written and visual  Treatments:     Previous treatment:  Surgery  Patient Goals:     Patient/Family goals for therapy:  Improved balance, improved ambulation, increased ROM and increased strength      Objective:       Range of Motion  Knee  Active ROM  Left Knee-Active ROM  Flexion: 100 degrees with pain  Extension: 0 degrees   Passive ROM  Left Knee-Passive ROM    Flexion: 105 degrees with pain  Extension: 0 degrees     Strength  Left Hip   Planes of Motion   Flexion: 4+  Extension: 4+  Abduction: 4  Adduction: 4+  Left Knee   Flexion: 4  Extension: 4-  Additional Strength Details   VMO R=5/5, L=3/5,      Tests     Functional Assessment   Lower Extremity Functional Scale  LEFS Score (out of 80): 8  Assessment: 90% funcitonal limitation  Functional Assessment Comments-WOMAC score = 62/96 indicating 65% functional impairment  Additional Tests Details:     WOMAC 69/96=72% impairment                                  I attest that I have reviewed the above information.  Signed: Marcianne Settler, PT  08/16/2023 5:47 AM

## 2023-08-21 DIAGNOSIS — M1712 Unilateral primary osteoarthritis, left knee: Principal | ICD-10-CM

## 2023-08-21 DIAGNOSIS — Z96652 Presence of left artificial knee joint: Principal | ICD-10-CM

## 2023-08-21 DIAGNOSIS — Z471 Aftercare following joint replacement surgery: Principal | ICD-10-CM

## 2023-08-21 NOTE — Unmapped (Signed)
 [facility] PHYSICAL THERAPY [place]  OUTPATIENT PHYSICAL THERAPY  08/21/2023  Note Type: Treatment Note       Patient Name: Stacy Fox  Date of Birth:[birth year]  Diagnosis:   Encounter Diagnoses   Name Primary?    Osteoarthritis of left knee, unspecified osteoarthritis type Yes    Aftercare following left knee joint replacement surgery      Referring MD:  Recardo Canal, PA     Date of Onset of Impairment-No date available  Date PT Care Plan Established or Reviewed-07/31/2023  Date PT Treatment Started-No date available   Visit Count: 25  Plan of Care Effective Date: 07/31/2023 - 09/28/2023     Visit 25/27 Re-evaluation due 08/31/2023    Assessment/Plan:    Assessment  Assessment details:    Patient remains limited of L knee ROM requiring stretching and strengthening exercises by intermixing quad/hip flexor stretching with strengthening activities. Will continue to progress with knee stretching and strengthening exercises as patient able to tolerate. PROM R knee 0-111 today in supine with Pts overpressure as measured after warm up and pre TE.     Patient's physician noted to recommend an additional 4 weeks of PT treatment.     Pt. has met 4 of 5 short term goals and demonstrates good progress toward all remaining goals. Patient continues to require skilled PT intervention to address current body structure impairments and activity limitations to return to functions of community and household mobility. Recommend pt. continue 2x/wk for an additional 4 weeks with plan to transition to HEP as pain decreases and function improves.      Impairments: decreased range of motion, gait deviation, impaired flexibility, decreased mobility, decreased strength, fall risk, impaired balance and pain        Prognosis: good prognosis        Examination of Body Systems: musculoskeletal    Clinical Decision Making: moderate    Positive Prognosis Rationale: motivated for treatment and response to trial tx.    Clinical Presentation: stable    Therapy Goals      Goals:      GOALS:  Short-term Goals to be achieved in 3 weeks. Patient will:  1. Independent with home exercise program for continued self treatment. MET  2. Improve L knee AROM by 15 degrees for improved community mobility. MET  3. Improve LLE strength by ?? muscle grade for improved community mobility. MET  4. Improve WOMAC Functional Scale by 10 pts. MET  5. Improve L SLS > 15 SECONDS or better for community ambulation. PROGRESSING        Long-term Goals to be achieved in 8 weeks. Patient will:  1. Have LLE VMO strength 4+/5 or greater for normal community ambulation. PROGRESSING  2. Ambulate greater than 1027ft for normal community ambulation painfree. PROGRESSING  3. Ambulate 1 full flight of stairs at and independent level with reciprocal gait. PROGRESSING  4. Improve WOMAC Functional Scale 20pts. PROGRESSING  5. Improve squatting ability [facility] and painfree. PROGRESSING    Plan    Therapy options: will be seen for skilled physical therapy services    Planned therapy interventions: 97140-Manual Therapy, 97530-Therapeutic Activities, 97750-Physical Performance Test, 97116-Gait Training, 97110-Therapeutic Exercises, 97112-Neuromuscular Re-education, 97032, G0283-Electrical Stimulation (unattended, attended), 97016-Pneumatic Devices (compression pump) and 97010-Cold Packs/Hot Packs      Frequency: 2x week    Duration in weeks: 4    Education provided to: patient.    Education provided: HEP, Fall prevention and Symptom management    Education results:  needs reinforcement and verbalized good understanding.    Communication/Consultation: N/A.      Total Session Time: 75    Treatment rendered today:      Therapeutic Treatment  DOS 05/23/23     ROM flexion: 111 degrees in supine   ROM extension 0 degrees AAROM    TE: 35 min.  -Recumbent bike x 10 min Lvl 5  -Standing step stretch for flexion 20 sec holds x 10   -Prone quadriceps stretch with use of strap 5 x 30 holds (with slight overpressure).   -supine knee FL with feet on 55 cm theraball with 5 sec holds end range flexion continuous x 3 min.  -supine B heels on 55 cm theraball, bridging/knee extension with hold x 5 sec x 3 min continuous  -Sidelying quad/hip flexor stretch with use of strap, 5 x 20 sec  -Prone B ankles on bolster, TKE with hold 30 x 5 sec    TA:  22 min.  -Gait activities: heel walks, toe walks, high knees, butt kicks, tin soldier, lateral sumo squat, each 2 x 30 feet  -Seated in chair, slide LLE under chair with foot flat on floor, use B UE to lift body up and forward for increased knee flexion stretch, 10 x 5 sec hold  -7 stair negotiation (9 steps) x4 reps  -Sit to stand 2 x 10 from standard height chair with airex under feet  -Step downs with eccentric lowering of LLE 3 x 10 reps    Patient required supervision and moderate verbal cues and demonstration for proper technique and to maintain proper posture throughout activities.     Manual Therapy: 8 min     -Patient seated, deep tissue mobilization to left quadriceps musculature .   -Patient seated, gentle cross-friction rub to proximal and distal L knee scar to patient tolerance.     -Patient seated, IASTM along lateral thigh and knee.    -Patient seated, patella mobs.      Modalities: x 10 min   -CP 10 min in long sitting to offset soreness.      Plan details: Modalities (heat, ice, ultrasound, electrical stimulation, iontophoresis), as needed for pain management. Massage, mobilization and manual therapy as needed to increase ROM in restricted soft tissues and joints. Therapeutic exercise, Neuromuscular Re-education, and education.      Subjective:     History of Present Condition     Date of surgery:  05/23/2023    History of Present Condition/Chief Complaint:  [name] who presents to PT s/p Left knee replacement. Patient presents WBAT with FWW, dressings intact. Patient with history of prior Right TKA.  Subjective:  6/10  Pt reports doing well, remains challenged with knee FL and end range pain.    6/5 Pt reports she has now started doing exercises in pool at home  6/3:  Pt reports doing well overall, was on her feet more over the past day.    08/07/2023: Patient reported feeling better overall, but still feeling knee stiffness. Patient reported negotiating a lot of stairs over the past several days.  08/02/2023: Patient reported slightly increased stiffness today and 5/10 pain upon presentation today.   07/31/23 Pt reports still feeling much tightness medial and lateral knee with attempts to bend the knee. Pt notes she went to the gym Saturday and did the recumbent bike and NuStep, felt fine over the weekend and yesterday but reports having increased knee pain today, had to take a  pain pill this AM.  07/26/23 Pt reports improved L knee pain upon arrival.  Remains challenged with steep stairs at home and stiffness in the morning.  Indicates unable to continue bike riding at gym due to family emergency.  Plans to ride at gym this weekend.       Quality of life:  Good  Pain:     Current pain rating:  5    At best pain rating:  5    At worst pain rating:  10  Location:  Circumferentially left knee    Quality:  Aching, tender and stiffness    Relieving factors:  Ice and rest    Aggravating factors:  As the day progresses, bending and performance of leg dominant activites  Precautions/Equipment   Precautions:  Weight bearing as tolerated    Current Braces/Orthoses:  None    Equipment Currently Used:  Rolling walker  Prior Functional Status     Physical limitation(s):  Patient reports limitations of 70% per [facility]  Current Functional Status:    disturbed sleep, limited bending, limited household activities, unsteady gait, limited walking tolerance and limited standing tolerance  Social Support:     Hand dominance:  Right    Communication Preference:  Verbal, written and visual  Treatments:     Previous treatment:  Surgery  Patient Goals:     Patient/Family goals for therapy:  Improved balance, improved ambulation, increased ROM and increased strength      Objective:       Range of Motion  Knee  Active ROM  Left Knee-Active ROM  Flexion: 100 degrees with pain  Extension: 0 degrees   Passive ROM  Left Knee-Passive ROM    Flexion: 105 degrees with pain  Extension: 0 degrees     Strength  Left Hip   Planes of Motion   Flexion: 4+  Extension: 4+  Abduction: 4  Adduction: 4+  Left Knee   Flexion: 4  Extension: 4-  Additional Strength Details   VMO R=5/5, L=3/5,      Tests     Functional Assessment   Lower Extremity Functional Scale  LEFS Score (out of 80): 8  Assessment: 90% funcitonal limitation  Functional Assessment Comments-WOMAC score = 62/96 indicating 65% functional impairment  Additional Tests Details:     WOMAC 69/96=72% impairment                Therapeutic Interventions Charges  $$ Therapeutic Activity [mins]: 22  $$ Therapeutic Exercise [mins]: 35  $$ Manual Therapy [mins]: 8                 I attest that I have reviewed the above information.  Signed: Belia Bowl, PTA  08/21/2023 10:14 AM

## 2023-08-23 DIAGNOSIS — M1712 Unilateral primary osteoarthritis, left knee: Principal | ICD-10-CM

## 2023-08-23 DIAGNOSIS — Z96652 Presence of left artificial knee joint: Principal | ICD-10-CM

## 2023-08-23 DIAGNOSIS — Z471 Aftercare following joint replacement surgery: Principal | ICD-10-CM

## 2023-08-23 NOTE — Unmapped (Addendum)
 [facility] PHYSICAL THERAPY [place]  OUTPATIENT PHYSICAL THERAPY  08/23/2023  Note Type: Treatment Note       Patient Name: Stacy Fox  Date of Birth:[birth year]  Diagnosis:   Encounter Diagnoses   Name Primary?    Osteoarthritis of left knee, unspecified osteoarthritis type Yes    Aftercare following left knee joint replacement surgery      Referring MD:  Stacy Canal, PA     Date of Onset of Impairment-No date available  Date PT Care Plan Established or Reviewed-07/31/2023  Date PT Treatment Started-No date available   Visit Count: 26  Plan of Care Effective Date: 07/31/2023 - 09/28/2023     Visit 26/27 Re-evaluation due 08/31/2023    Assessment/Plan:    Assessment  Assessment details:    Patient responded well to gentle progression of PT treatment today. Will continue to progress with knee stretching and strengthening exercises as patient able to tolerate. PROM R knee 0-118 today in supine with patient over-pressure with strap. Will continue to progress as patient able to tolerate and as appropriate. Patient declined modalities after session completed due to no significant increase in pain or muscle soreness.       Impairments: decreased range of motion, gait deviation, impaired flexibility, decreased mobility, decreased strength, fall risk, impaired balance and pain        Prognosis: good prognosis        Examination of Body Systems: musculoskeletal    Clinical Decision Making: moderate    Positive Prognosis Rationale: motivated for treatment and response to trial tx.    Clinical Presentation: stable    Therapy Goals      Goals:      GOALS:  Short-term Goals to be achieved in 3 weeks. Patient will:  1. Independent with home exercise program for continued self treatment. MET  2. Improve L knee AROM by 15 degrees for improved community mobility. MET  3. Improve LLE strength by ?? muscle grade for improved community mobility. MET  4. Improve WOMAC Functional Scale by 10 pts. MET  5. Improve L SLS > 15 SECONDS or better for community ambulation. PROGRESSING        Long-term Goals to be achieved in 8 weeks. Patient will:  1. Have LLE VMO strength 4+/5 or greater for normal community ambulation. PROGRESSING  2. Ambulate greater than 1079ft for normal community ambulation painfree. PROGRESSING  3. Ambulate 1 full flight of stairs at and independent level with reciprocal gait. PROGRESSING  4. Improve WOMAC Functional Scale 20pts. PROGRESSING  5. Improve squatting ability [facility] and painfree. PROGRESSING    Plan    Therapy options: will be seen for skilled physical therapy services    Planned therapy interventions: 97140-Manual Therapy, 97530-Therapeutic Activities, 97750-Physical Performance Test, 97116-Gait Training, 97110-Therapeutic Exercises, 97112-Neuromuscular Re-education, 97032, G0283-Electrical Stimulation (unattended, attended), 97016-Pneumatic Devices (compression pump) and 97010-Cold Packs/Hot Packs      Frequency: 2x week    Duration in weeks: 4    Education provided to: patient.    Education provided: HEP, Fall prevention and Symptom management    Education results: needs reinforcement and verbalized good understanding.    Communication/Consultation: N/A.      Total Session Time: 55    Treatment rendered today:      Therapeutic Treatment  DOS 05/23/23     ROM flexion: 118 degrees in supine   ROM extension 0 degrees AAROM    TE: 25 min.  -Recumbent bike x 10 min Lvl 5  -Treadmill @  1.2 mph x5 min.   -Supine heel slides 5 x 30  -Standing step stretch for flexion and extension 20 sec holds x 5 reps each way  -Slant board 5 x15 holds    TA:  22 min.  -7 stair negotiation (9 steps) x4 reps  -Retro walking uphill x50 feet to increase functional left knee extension   -Gait activities: heel walks, toe walks, high knees, butt kicks, tin soldier, lateral sumo squat, each 2 x 30 feet  -Sit to stand 2 x 10 from standard height chair with airex under feet    Patient required supervision and moderate verbal cues and demonstration for proper technique and to maintain proper posture throughout activities.     Manual Therapy: 8 min     -Patient supine, gentle grade 2 A/P tibiofemoral mobilizations 3 x 10 reps  -Patient seated, deep tissue mobilization to left quadriceps musculature .   -Patient seated, joint distractions and grade III A/P tibiofemoral mobilizations 3 x 10 reps    Modalities: Declined due to no increased pain or soreness.     Plan details: Modalities (heat, ice, ultrasound, electrical stimulation, iontophoresis), as needed for pain management. Massage, mobilization and manual therapy as needed to increase ROM in restricted soft tissues and joints. Therapeutic exercise, Neuromuscular Re-education, and education.      Subjective:     History of Present Condition     Date of surgery:  05/23/2023    History of Present Condition/Chief Complaint:  [name] who presents to PT s/p Left knee replacement. Patient presents WBAT with FWW, dressings intact. Patient with history of prior Right TKA.  Subjective:  6/10  Pt reports doing well, remains challenged with knee FL and end range pain.    6/5 Pt reports she has now started doing exercises in pool at home  6/3:  Pt reports doing well overall, was on her feet more over the past day.    08/07/2023: Patient reported feeling better overall, but still feeling knee stiffness. Patient reported negotiating a lot of stairs over the past several days.  08/02/2023: Patient reported slightly increased stiffness today and 5/10 pain upon presentation today.   07/31/23 Pt reports still feeling much tightness medial and lateral knee with attempts to bend the knee. Pt notes she went to the gym Saturday and did the recumbent bike and NuStep, felt fine over the weekend and yesterday but reports having increased knee pain today, had to take a pain pill this AM.  07/26/23 Pt reports improved L knee pain upon arrival.  Remains challenged with steep stairs at home and stiffness in the morning. Indicates unable to continue bike riding at gym due to family emergency.  Plans to ride at gym this weekend.       Quality of life:  Good  Pain:     Current pain rating:  5    At best pain rating:  5    At worst pain rating:  10  Location:  Circumferentially left knee    Quality:  Aching, tender and stiffness    Relieving factors:  Ice and rest    Aggravating factors:  As the day progresses, bending and performance of leg dominant activites  Precautions/Equipment   Precautions:  Weight bearing as tolerated    Current Braces/Orthoses:  None    Equipment Currently Used:  Rolling walker  Prior Functional Status     Physical limitation(s):  Patient reports limitations of 70% per [facility]  Current  Functional Status:    disturbed sleep, limited bending, limited household activities, unsteady gait, limited walking tolerance and limited standing tolerance  Social Support:     Hand dominance:  Right    Communication Preference:  Verbal, written and visual  Treatments:     Previous treatment:  Surgery  Patient Goals:     Patient/Family goals for therapy:  Improved balance, improved ambulation, increased ROM and increased strength      Objective:       Range of Motion  Knee  Active ROM  Left Knee-Active ROM  Flexion: 100 degrees with pain  Extension: 0 degrees   Passive ROM  Left Knee-Passive ROM    Flexion: 105 degrees with pain  Extension: 0 degrees     Strength  Left Hip   Planes of Motion   Flexion: 4+  Extension: 4+  Abduction: 4  Adduction: 4+  Left Knee   Flexion: 4  Extension: 4-  Additional Strength Details   VMO R=5/5, L=3/5,      Tests     Functional Assessment   Lower Extremity Functional Scale  LEFS Score (out of 80): 8  Assessment: 90% funcitonal limitation  Functional Assessment Comments-WOMAC score = 62/96 indicating 65% functional impairment  Additional Tests Details:     WOMAC 69/96=72% impairment                                  I attest that I have reviewed the above information.  Signed: Antonia Battiest, PT  08/23/2023 9:40 AM

## 2023-08-28 ENCOUNTER — Ambulatory Visit: Admit: 2023-08-28 | Payer: Medicaid (Managed Care)

## 2023-08-28 DIAGNOSIS — Z96652 Presence of left artificial knee joint: Principal | ICD-10-CM

## 2023-08-28 DIAGNOSIS — M1712 Unilateral primary osteoarthritis, left knee: Principal | ICD-10-CM

## 2023-08-28 DIAGNOSIS — Z471 Aftercare following joint replacement surgery: Principal | ICD-10-CM

## 2023-08-28 NOTE — Unmapped (Signed)
 [facility] [place]  OUTPATIENT PHYSICAL THERAPY  08/28/2023  Note Type: Discharge Note       Patient Name: Stacy Fox  Date of Birth:06-22-1960  Diagnosis:   Encounter Diagnoses   Name Primary?    Osteoarthritis of left knee, unspecified osteoarthritis type Yes    Aftercare following left knee joint replacement surgery      Referring MD:  Verta Emeline Sieving, PA     Date of Onset of Impairment-No date available  Date PT Care Plan Established or Reviewed-07/31/2023  Date PT Treatment Started-No date available   Visit Count: 27  Plan of Care Effective Date: 07/31/2023 - 09/28/2023     Visit 27/27 Re-evaluation due 08/31/2023    Assessment/Plan:    Assessment  Assessment details:    Patient responded well to gentle progression of PT treatment today. PROM R knee 0-118 today and AROM R knee 0-115 degrees in supine with patient over-pressure with strap. Patient again declined modalities after session completed due to no significant increase in pain or muscle soreness. Patient has now met all short term goals and 4/5 long term goals (final goal not reached by 1 point) and will be discharged to home exercise program with most goals met. It has been a pleasure working with Stacy Fox, thank you for this referral.       Impairments: decreased range of motion, gait deviation, impaired flexibility, decreased mobility, decreased strength, fall risk, impaired balance and pain        Prognosis: good prognosis        Examination of Body Systems: musculoskeletal    Clinical Decision Making: moderate    Positive Prognosis Rationale: motivated for treatment and response to trial tx.    Clinical Presentation: stable    Therapy Goals      Goals:      GOALS:  Short-term Goals to be achieved in 3 weeks. Patient will:  1. Independent with home exercise program for continued self treatment. MET  2. Improve L knee AROM by 15 degrees for improved community mobility. MET  3. Improve LLE strength by ?? muscle grade for improved community mobility. MET  4. Improve WOMAC Functional Scale by 10 pts. MET  5. Improve L SLS > 15 SECONDS or better for community ambulation. MET        Long-term Goals to be achieved in 8 weeks. Patient will:  1. Have LLE VMO strength 4+/5 or greater for normal community ambulation. MET  2. Ambulate greater than 1051ft for normal community ambulation painfree. MET  3. Ambulate 1 full flight of stairs at and independent level with reciprocal gait. MET  4. Improve WOMAC Functional Scale 20pts. Partially met (19 point improvement)  5. Improve squatting ability [facility] and painfree. MET    Plan    Therapy options: will be seen for skilled physical therapy services    Planned therapy interventions: 97140-Manual Therapy, 97530-Therapeutic Activities, 97750-Physical Performance Test, 97116-Gait Training, 97110-Therapeutic Exercises, 97112-Neuromuscular Re-education, 97032, G0283-Electrical Stimulation (unattended, attended), 97016-Pneumatic Devices (compression pump) and 97010-Cold Packs/Hot Packs      Frequency: 2x week    Duration in weeks: 4    Education provided to: patient.    Education provided: HEP, Fall prevention and Symptom management    Education results: needs reinforcement and verbalized good understanding.    Communication/Consultation: D/C note sent to Referring Provider.      Total Session Time: 64    Treatment rendered today:      Therapeutic Treatment  DOS  05/23/23     ROM flexion: 118 degrees in supine   ROM extension 0 degrees AAROM    Gait training: 8 minutes    Patient given gait training on both even and uneven concrete surfaces x1000 feet with patient demonstrating independence and reporting no increased pain or difficulty and demonstrating no antalgic gait pattern today.       TE: 26 min.  -Recumbent bike x 10 min Lvl 5  -Treadmill @ 1.2 mph x5 min.   -Supine heel slides 5 x 30  -Standing step stretch for flexion and extension 20 sec holds x 5 reps each way     97110 TE: Pt. was gently progressed to tolerance with left knee strengthening & stretching exercises as above to increase pt.'s ability to meet functional goals. Pt. required min. VCs for proper exercise technique.    TA:  19 min.  -7 stair negotiation (9 steps) x4 reps  -BOSU lunges 2 x 10 reps with 5 holds  -Foam marching x40 reps   -Standing on solid surface cross-body cone taps x30 reps   -Sit to stand 2 x 10 from standard height chair with airex under feet    Patient required supervision and minimal verbal cues and demonstration for proper technique and to maintain proper posture throughout activities.     Manual Therapy: 4 min     -Patient supine, patient was given gentle grade 2 A/P tibiofemoral mobilizations 2 x 10 reps with increased flexion range of motion noted after intervention.       Modalities: Declined due to no increased pain or soreness.     Plan details: Modalities (heat, ice, ultrasound, electrical stimulation, iontophoresis), as needed for pain management. Massage, mobilization and manual therapy as needed to increase ROM in restricted soft tissues and joints. Therapeutic exercise, Neuromuscular Re-education, and education.      Subjective:     History of Present Condition     Date of surgery:  05/23/2023    History of Present Condition/Chief Complaint:  [name] who presents to PT s/p Left knee replacement. Patient presents WBAT with FWW, dressings intact. Patient with history of prior Right TKA.  Subjective:  08/28/2023: Patient reported feeling well today and reported doing a lot of cooking yesterday and today.   6/10  Pt reports doing well, remains challenged with knee FL and end range pain.    6/5 Pt reports she has now started doing exercises in pool at home  6/3:  Pt reports doing well overall, was on her feet more over the past day.    08/07/2023: Patient reported feeling better overall, but still feeling knee stiffness. Patient reported negotiating a lot of stairs over the past several days.  08/02/2023: Patient reported slightly increased stiffness today and 5/10 pain upon presentation today.   07/31/23 Pt reports still feeling much tightness medial and lateral knee with attempts to bend the knee. Pt notes she went to the gym Saturday and did the recumbent bike and [employer], felt fine over the weekend and yesterday but reports having increased knee pain today, had to take a pain pill this AM.  07/26/23 Pt reports improved L knee pain upon arrival.  Remains challenged with steep stairs at home and stiffness in the morning.  Indicates unable to continue bike riding at gym due to family emergency.  Plans to ride at gym this weekend.       Quality of life:  Good  Pain:  Current pain rating:  0    At best pain rating:  0    At worst pain rating:  10  Location:  Circumferentially left knee    Quality:  Aching, tender and stiffness    Relieving factors:  Ice and rest    Aggravating factors:  As the day progresses, bending and performance of leg dominant activites  Precautions/Equipment   Precautions:  Weight bearing as tolerated    Current Braces/Orthoses:  None    Equipment Currently Used:  Rolling walker  Prior Functional Status     Physical limitation(s):  Patient reports limitations of 70% per [facility]  Current Functional Status:    disturbed sleep, limited bending, limited household activities, unsteady gait, limited walking tolerance and limited standing tolerance  Social Support:     Hand dominance:  Right    Communication Preference:  Verbal, written and visual  Treatments:     Previous treatment:  Surgery  Patient Goals:     Patient/Family goals for therapy:  Improved balance, improved ambulation, increased ROM and increased strength      Objective:       Range of Motion  Knee  Active ROM  Left Knee-Active ROM  Flexion: 115 degrees with pain  Extension: 0 degrees   Passive ROM  Left Knee-Passive ROM    Flexion: 118 degrees with pain  Extension: 0 degrees     Strength  Left Hip   Planes of Motion   Flexion: 5  Extension: 5  Abduction: 5  Adduction: 5  Left Knee   Flexion: 5  Extension: 5  Additional Strength Details   VMO R=5/5, L=4+/5,      Tests     Functional Assessment   Lower Extremity Functional Scale  LEFS Score (out of 80): 8  Assessment: 90% funcitonal limitation  Functional Assessment Comments-WOMAC score = 62/96 indicating 65% functional impairment  Additional Tests Details:     WOMAC 69/96=72% impairment                                  I attest that I have reviewed the above information.  Signed: Norleen SHAUNNA Borders, PT  08/28/2023 11:21 AM

## 2023-09-04 MED FILL — OZEMPIC 2 MG/DOSE (8 MG/3 ML) SUBCUTANEOUS PEN INJECTOR: SUBCUTANEOUS | 28 days supply | Qty: 3 | Fill #1

## 2023-09-20 ENCOUNTER — Encounter: Admit: 2023-09-20 | Discharge: 2023-09-20 | Payer: Medicaid (Managed Care)

## 2023-09-20 ENCOUNTER — Inpatient Hospital Stay: Admit: 2023-09-20 | Discharge: 2023-09-20

## 2023-09-20 MED ADMIN — lactated Ringers infusion: 10 mL/h | INTRAVENOUS | @ 13:00:00 | Stop: 2023-09-20

## 2023-09-20 MED ADMIN — lactated Ringers infusion: 10 mL/h | INTRAVENOUS | @ 14:00:00 | Stop: 2023-09-20

## 2023-09-20 MED ADMIN — Propofol (DIPRIVAN) injection: INTRAVENOUS | @ 15:00:00 | Stop: 2023-09-20

## 2023-09-20 MED ADMIN — lidocaine (PF) (XYLOCAINE-MPF) 20 mg/mL (2 %) injection: INTRAVENOUS | @ 15:00:00 | Stop: 2023-09-20

## 2023-10-01 MED FILL — OZEMPIC 2 MG/DOSE (8 MG/3 ML) SUBCUTANEOUS PEN INJECTOR: SUBCUTANEOUS | 28 days supply | Qty: 3 | Fill #2

## 2023-10-02 DIAGNOSIS — M810 Age-related osteoporosis without current pathological fracture: Principal | ICD-10-CM

## 2023-10-09 ENCOUNTER — Inpatient Hospital Stay: Admit: 2023-10-09 | Discharge: 2023-10-09 | Payer: Medicaid (Managed Care)

## 2023-10-29 ENCOUNTER — Inpatient Hospital Stay: Admit: 2023-10-29 | Discharge: 2023-10-29 | Payer: Medicaid (Managed Care)

## 2023-10-31 MED FILL — OZEMPIC 2 MG/DOSE (8 MG/3 ML) SUBCUTANEOUS PEN INJECTOR: SUBCUTANEOUS | 28 days supply | Qty: 3 | Fill #3

## 2023-11-19 DIAGNOSIS — Z01419 Encounter for gynecological examination (general) (routine) without abnormal findings: Principal | ICD-10-CM

## 2023-11-19 DIAGNOSIS — N898 Other specified noninflammatory disorders of vagina: Principal | ICD-10-CM

## 2023-11-19 DIAGNOSIS — Z780 Asymptomatic menopausal state: Principal | ICD-10-CM

## 2023-11-19 DIAGNOSIS — L719 Rosacea, unspecified: Principal | ICD-10-CM

## 2023-11-19 MED ORDER — METRONIDAZOLE 0.75 % (37.5 MG/5 GRAM) VAGINAL GEL
Freq: Every evening | VAGINAL | 2 refills | 0.00000 days | Status: CP
Start: 2023-11-19 — End: 2023-11-24

## 2023-11-19 NOTE — Unmapped (Signed)
 Patient is here for annual-last pap 10/05/23 no history of abnormal. Mammogram to be done 12/05/23. Dexa scan up to date. She is post menopausal, no bleeding. Patient gets blood work at [facility]. She declined std checks today.

## 2023-11-19 NOTE — Unmapped (Signed)
 Well Woman Annual Exam    Name:  Stacy Fox  DOB: 12/11/1960  Today's Date: 11/19/2023  Age:  63 y.o.  Sex: female      Assessment/Plan:      Stacy Fox was seen today for gynecologic exam.    Diagnoses and all orders for this visit:    Well woman exam with routine gynecological exam  -     Pap Test    Menopause    Vaginal odor  -     metroNIDAZOLE  (METROGEL ) 0.75 % (37.5mg /5 gram) vaginal gel; Insert into the vagina nightly for 5 days.    Rosacea    Other orders  -     Ambulatory referral to Gynecology        Health Maintenance   Topic Date Due    Zoster Vaccines (1 of 2) Never done    Retinal Eye Exam  11/30/2014    Pneumococcal Vaccine 50+ (2 of 2 - PPSV23, PCV20, or PCV21) 08/31/2015    Pap Smear (21-65)  10/06/2023    Hemoglobin A1c  10/16/2023    COVID-19 Vaccine (4 - 2025-26 season) 11/12/2023    Influenza Vaccine (1) 11/12/2023    Foot Exam  12/05/2023    Urine Albumin/Creatinine Ratio  12/05/2023    Mammogram  01/04/2024    Serum Creatinine Monitoring  05/24/2024    Potassium Monitoring  05/24/2024    DTaP/Tdap/Td Vaccines (2 - Td or Tdap) 10/19/2024    Colon Cancer Screening  10/22/2030    Hepatitis C Screen  Completed   The patient returns to the office today after a 3+ year hiatus.  She was seen for her well woman exam.  She is 63 years of age, postmenopausal without symptoms of concern or postmenopausal bleeding.  She is married x 20+ years to her female spouse.  They are not currently sexually active due to his medical history and difficulty having intercourse.  Patient's most recent mammogram was last year and she is advised to call to schedule.  She agrees.  Her last bone density testing was done 2 months ago and unilateral osteopenia was found.  Plan at this time to repeat in 2 years-July 2027.    Physical exam was performed and the patient is advised of today's exam findings.  Pap smear sampling was performed.  Breast self-exam is encouraged and reviewed.  Patient is having no particular concerns with menopause, no bleeding.  She does have itching on the external genitals from time to time.  She is advised exam appears to be normal however I would recommend that she use topical coconut oil as needed itching and to follow-up if this is not helpful.  Patient is concerned with some vaginal odor.  On exam no odor was noted but have advised her I will send in metronidazole  vaginal gel for her to use as needed odor.  Patient's questions are answered she agrees with this.    We talked about what does appear to be rosacea on both of her cheeks.  I have advised her that she can use the same metronidazole  gel, that she will use vaginally, on her cheeks twice daily.  She will monitor for positive response and keep me advised if it is not helpful.    The patient will follow-up with us  as needed concerns and again in 1 year.  She will also see her PCP and liver specialist as planned by their offices.  She is commended on her weight loss and continued use  of Ozempic .    I have reviewed and (if needed) updated the patient's problem list, medications, allergies, past medical and surgical history, preventive services, and social and family history.      Preventive Medical Services  The following preventive services were advised:  -- Eye care  -- Dental care  -- Cervical cancer screening guidelines are reviewed  -- Breast self exam is reviewed and encouraged  -- Routine dermatology checks recommended  -- Healthy lifestyle Choices - diet, exercise, sleep, self care    Immunization History   Administered Date(s) Administered    COVID-19 VACCINE,MRNA(MODERNA)(PF) 07/11/2019, 08/08/2019, 03/08/2020    INFLUENZA VACCINE IIV3(IM)(PF)6 MOS UP 12/05/2022    Influenza Vaccine Quad(IM)6 MO-Adult(PF) 12/03/2014    Influenza Virus Vaccine, unspecified formulation 12/03/2014    Pneumococcal Conjugate 13-Valent 07/06/2015    TdaP 10/20/2014              Subjective:        Chief Complaint   Patient presents with    Gynecologic Exam HPI:   Well Woman Exam:  Stacy Fox is here for annual exam. No LMP recorded (lmp unknown). Patient is postmenopausal.  The patient returns to our care today for her well woman exam.  She was last seen in July 2022.  At that time she had a normal Pap smear screening.  She is now 63 years of age, she is married x 21 years, not currently sexually active with her female spouse.  She does have an adult son 76 years of age that lives outside of the area.  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  Patient is postmenopausal and denies postmenopausal bleeding.  Her last mammogram was in September 2024 and it was normal.  Bone density was not done in July of this year.  Left femoral neck showed advancing osteopenia.  Right side reveals no bone loss.    The patient is under the care of PCP.  She was recently assigned to her current provider and has seen them once.  They had made a recommendation that she return to our office for her routine well woman care.  They did order her most recent routine lab testing, she is currently on Ozempic  to help with weight loss along with her history of fatty liver disease.  She does have a liver specialist to is following her and she will be seeing them soon.  She did have a colonoscopy 3 years ago and it was normal.  Recommended to have them done every 10 years.  Vital Signs:     Wt Readings from Last 3 Encounters:   11/19/23 91.9 kg (202 lb 9.6 oz)   09/20/23 87.5 kg (193 lb)   05/25/23 88.9 kg (196 lb)     Temp Readings from Last 3 Encounters:   09/20/23 36.1 ??C (97 ??F) (Temporal)   05/25/23 37.3 ??C (99.1 ??F) (Oral)   04/03/23 36.6 ??C (97.9 ??F) (Tympanic)     BP Readings from Last 3 Encounters:   11/19/23 122/70   09/20/23 105/73   05/26/23 129/80     Pulse Readings from Last 3 Encounters:   09/20/23 78   05/26/23 99   04/03/23 87     Estimated body mass index is 40.92 kg/m?? as calculated from the following:    Height as of this encounter: 149.9 cm (4' 11).    Weight as of this encounter: 91.9 kg (202 lb 9.6 oz).  Facility  age limit for growth %iles is 20 years.      Body mass index is 40.92 kg/m??.    Allergies:     Ranitidine    Current Medications:     Current Medications[1]     Social History:     Social History[2]    Past Medical/Surgical History:     Past Medical History[3]  Past Surgical History[4]    OB History       Gravida   1    Para   1    Term   1    Preterm        AB        Living   1         SAB        IAB        Ectopic        Molar        Multiple        Live Births   1                Health Maintenance   Topic Date Due    Zoster Vaccines (1 of 2) Never done    Retinal Eye Exam  11/30/2014    Pneumococcal Vaccine 50+ (2 of 2 - PPSV23, PCV20, or PCV21) 08/31/2015    Pap Smear (21-65)  10/06/2023    Hemoglobin A1c  10/16/2023    COVID-19 Vaccine (4 - 2025-26 season) 11/12/2023    Influenza Vaccine (1) 11/12/2023    Foot Exam  12/05/2023    Urine Albumin/Creatinine Ratio  12/05/2023    Mammogram  01/04/2024    Serum Creatinine Monitoring  05/24/2024    Potassium Monitoring  05/24/2024    DTaP/Tdap/Td Vaccines (2 - Td or Tdap) 10/19/2024    Colon Cancer Screening  10/22/2030    Hepatitis C Screen  Completed       Family History:     Family History[5]          ROS:       General: no fatigue, unexpected weight loss or gain, overall feels well, denies fever  Dermatologic: denies rashes, other skin lesions or changes with exception of several red spots on her right upper cheek which she states her provider said was rosacea.  Neurological: denies dizziness, weakness, fainting.   No issues with memory and concentration is normal, No concerns with headaches  Opthalmologic: denies visual problems, wears glasses and is due for routine vision check.  Respiratory: No shortness of breath, wheezing or cough, endurance is normal   Allergy:  No runny nose, itchy eyes or post nasal drip  Urology: Denies difficulty urinating, no painful urination, frequency or urgency.  No stress or urge incontinence.  No issues with nocturia  ENT: No hearing Loss. No ringing pain or pressure in ears. No sore throats, difficulty swallowing, hoarseness or voice changes, no recurring choking episodes.  Cardiovascular: denies chest pain or heaviness. No racing or pounding heart episodes, no palpitations, dizziness or shortness of breath  Gastrointestinal: No acid reflux concerns, denies nausea, vomiting, appetite is normal.  No abdominal pain, unusual bloating or gas. No constipation or diarrhea.  Denies bloody stools, pain with BMs or difficulty having a BM.  No hemorrhoid issues.  Normal colonoscopy in 2022  Psychology: No depression or anxiety. no irritability.  Appetite is suppressed secondary to the use of Ozempic   Female Reproductive: No LMP recorded (lmp unknown). Patient is postmenopausal.  The patient is married but  she and her spouse are no longer sexually active.  He has some medical conditions that make it difficult for him.  She denies abnormal vaginal discharge, pelvic pain, breast concerns, hot flashes or vaginal spotting.     Objective:        Labs:     No results found for this visit on 11/19/23.    GENERAL APPEARANCE: NAD, pleasant. General appearance well nourished. Well groomed, obesity class III  PSYCHOLOGIC: Affect normal with appropriate emotion response, normal judgement. Orientation alert and oriented X3. Recall normal. Normal eye contact  HEENT: [place]/AT,  EOM nl bilaterally.  Wearing glasses the nasal passages are patent without edema erythema or discharge. The throat is without lesions erythema or discharge. Uvula is midline.  Ear canals and TMs are normal bilaterally.  NECK: supple, no lymphadenopathy, no thyromegaly.  CARDIOVASCULAR: Normal rate, regular without auscultated murmurs, click or rubs. No skipped beats  RESPIRATORY: clear to auscultation without rhonchi, rales or congestion.  DERMATOLOGY:  The pt's skin is warm and dry, without lesions of concern, normal moles/freckling.  Rosacea-like rash is noted on right upper facial cheek and both cheeks have a ruddy appearance.  BREASTS: Breasts are bilaterally large and pendulous, normal visual appearance without skin changes, Nipples evert without discharge, normal glandular tissue without mass bilaterally, no axillary adenopathy bilaterally.  ABDOMEN: soft, morbidly obese, exam is limited due to body habitus, NT/ND  NEUROLOGIC EXAM: Coordination and gait are normal, short and long term memory normal, Cognition is WNL. No focal signs, CN's II-XII grossly intact.  EXTREMITIES: no edema, no cyanosis, warm to touch  GENITOURINARY FEMALE: .External genitalia normal without lesion, mass or d/c. Perineum normal without lesion or deformity. The vaginal introitus without lesion d/c or mass, BUS glands neg. Vaginal vault normal without abnormal discharge, lesion or mass. Normal support. Cervix mid position without discharge, lesion or CMT. Pap smear sample is performed.  Bimanual pelvic exam is very limited due to body habitus.  Positioning and dimensions of the uterus were unable to be felt but the uterus is nontender.  Uterus NSSCM/Mid/NT. Adnexa is non tender.  Further assessment was unable to be performed secondary to body habitus.       Mliss DELENA Reveal, [facility]         [1]   Current Outpatient Medications   Medication Sig Dispense Refill    aspirin  81 MG chewable tablet Chew 1 tablet (81 mg total) daily. 30 tablet 6    blood sugar diagnostic (ON CALL EXPRESS TEST STRIP) Strp Use to check blood sugar as directed with insulin 3 times a day and for symptoms of high or low blood sugar. 100 each 6    calcium-vitamin D 500 mg-5 mcg (200 unit) per tablet Take 2 tablets by mouth in the morning and 2 tablets in the evening. Take with meals.      lancets (ON CALL LANCET) 30 gauge Misc Use to check blood sugar as directed with insulin 3 times a day & for symptoms of high or low blood sugar. 100 each 5 lancing device Misc Use as directed 1 each 0    rosuvastatin  (CRESTOR ) 20 MG tablet Take 1 tablet (20 mg total) by mouth daily. 90 tablet 1    semaglutide  (OZEMPIC ) 2 mg/dose (8 mg/3 mL) PnIj Inject 2 mg under the skin every seven (7) days. 3 mL 4    metroNIDAZOLE  (METROGEL ) 0.75 % (37.5mg /5 gram) vaginal gel Insert into the vagina nightly for 5 days. 30 g 2  No current facility-administered medications for this visit.   [2]   Social History  Socioeconomic History    Marital status: Married     Spouse name: None    Number of children: 1    Years of education: None    Highest education level: None   Tobacco Use    Smoking status: Never     Passive exposure: Never    Smokeless tobacco: Never   Vaping Use    Vaping status: Never Used   Substance and Sexual Activity    Alcohol use: Not Currently    Drug use: No    Sexual activity: Not Currently     Partners: Male   Other Topics Concern    Do you use sunscreen? No    Tanning bed use? No    Are you easily burned? No    Excessive sun exposure? No    Blistering sunburns? No     Social Drivers of Psychologist, prison and probation services Strain: Low Risk  (04/07/2022)    Overall Financial Resource Strain (CARDIA)     Difficulty of Paying Living Expenses: Not hard at all   Food Insecurity: Low Risk  (05/23/2023)    Received from [facility]    Hunger Vital Sign     Within the past 12 months, you worried that your food would run out before you got money to buy more: Never true     Within the past 12 months, the food you bought just didn't last and you didn't have money to get more. : Never true   Transportation Needs: No Transportation Needs (05/23/2023)    Received from [employer]     In the past 12 months, has lack of reliable transportation kept you from medical appointments, meetings, work or from getting things needed for daily living? : No   Physical Activity: Insufficiently Active (09/16/2020)    Exercise Vital Sign     Days of Exercise per Week: 4 days Minutes of Exercise per Session: 10 min   Stress: No Stress Concern Present (03/27/2023)    [employer] of Occupational Health - Occupational Stress Questionnaire     Feeling of Stress : Not at all   Social Connections: Socially Integrated (04/07/2022)    Social Connection and Isolation Panel     Frequency of Communication with Friends and Family: Twice a week     Frequency of Social Gatherings with Friends and Family: Twice a week     Attends Religious Services: 1 to 4 times per year     Active Member of [employer] or Organizations: No     Attends Banker Meetings: 1 to 4 times per year     Marital Status: Married   Housing: Low Risk  (05/23/2023)    Received from [employer] Stability Vital Sign     What is your living situation today?: I have a steady place to live     Think about the place you live. Do you have problems with any of the following? Choose all that apply:: None/None on this list   [3]   Past Medical History:  Diagnosis Date    Acute post-traumatic headache     Breast cyst     Cervical spondylosis     Cervicalgia     Chronic pain syndrome     Colon polyp     Degeneration of cervical intervertebral disc     Diabetes  mellitus    (CMS-HCC)     Disturbance of skin sensation     Fatty liver     Fatty liver     GERD (gastroesophageal reflux disease)     Hyperlipidemia     Legally blind     Morbid obesity with BMI of 40.0-44.9, adult (CMS-HCC)     Osteoarthritis     Pain in joint, shoulder region     Pain in thoracic spine     Pneumonia     Rectal bleeding     Sleep apnea     uses cpap    Sprain of neck     Varices of spleen    [4]   Past Surgical History:  Procedure Laterality Date    BREAST BIOPSY Left     1990's- benign    BREAST LUMPECTOMY Left     Left breast 1990's- Benign cyst removed per patient    CARPAL TUNNEL RELEASE Bilateral     CHOLECYSTECTOMY      03/18/2021    COLONOSCOPY      x 2 (patient thinks 1 polyp removed)    CYSTOSCOPY W/ URETERAL STENT PLACEMENT      kidney stone removed    ELBOW SURGERY Right     nerve release    ESOPHAGOGASTRODUODENOSCOPY      x2    EYE SURGERY Left     tighten left eye muscle    FINGER SURGERY Bilateral     trigger finger and both thumbs    FOOT SURGERY Left     bone shaved    GANGLION CYST EXCISION Left     left wrist (multiple times removed)    HAND SURGERY Right     laceration repair to right hand    HERNIA REPAIR Midline 03/18/2021    JOINT REPLACEMENT Right 2018    right knee replacement    PR COLONOSCOPY FLX DX W/COLLJ SPEC WHEN PFRMD Left 12/30/2014    Procedure: COLONOSCOPY, FLEXIBLE, PROXIMAL TO SPLENIC FLEXURE; DIAGNOSTIC, W/WO COLLECTION SPECIMEN BY BRUSH OR WASH;  Surgeon: Vanderbilt Krystal Pan, MD;  Location: Endo Procedures [facility];  Service: General Surgery    PR COLONOSCOPY FLX DX W/COLLJ SPEC WHEN PFRMD N/A 10/21/2020    Procedure: COLONOSCOPY, FLEXIBLE, PROXIMAL TO SPLENIC FLEXURE; DIAGNOSTIC, W/WO COLLECTION SPECIMEN BY BRUSH OR WASH;  Surgeon: Ellaree Sergio Bolus, MD;  Location: ENDO OR [facility];  Service: General Surgery    PR KNEE SCOPE,MED/LAT MENISECTOMY Right 09/02/2014    Procedure: ARTHROSCOPY, KNEE; W/MENISECT(MED/LAT, INCL MENISCAL SHAVE) W/DEBRIDE/SHAVE ARTICULAR CART(CHONDROPLASTY);  Surgeon: Vinie JAYSON Fonder, MD;  Location: [facility];  Service: Orthopedics    PR LAP,CHOLECYSTECTOMY/GRAPH N/A 03/18/2021    Procedure: LAPAROSCOPIC CHOLECYSTECTOMY POSS CHOLANGIOGRAPHY;  Surgeon: Duwaine Rollo Jumper, MD;  Location: OR [facility];  Service: General Surgery    PR UPPER GI ENDOSCOPY,DIAGNOSIS N/A 05/26/2021    Procedure: UGI ENDO, INCLUDE ESOPHAGUS, STOMACH, & DUODENUM &/OR JEJUNUM; DX W/WO COLLECTION SPECIMN, BY BRUSH OR WASH;  Surgeon: Sim Donna Magnuson, MD;  [facility];  Service: Gastroenterology    PR UPPER GI ENDOSCOPY,DIAGNOSIS N/A 09/20/2023    Procedure: UGI ENDO, INCLUDE ESOPHAGUS, STOMACH, & DUODENUM &/OR JEJUNUM; DX W/WO COLLECTION SPECIMN, BY BRUSH OR WASH;  Surgeon: Gwenyth Prentice Agent, MD;  [facility]: [facility];  Service: Gastroenterology    ROTATOR CUFF REPAIR      left - 2013 and right - 2015   [5]   Family History  Problem Relation Age of Onset  Heart disease Mother     Emphysema Father     Leukemia Sister     Cancer Sister 40        melanoma    Cancer Sister 67        leukemia    Ovarian cancer Sister     Breast cancer Neg Hx     Melanoma Neg Hx     Basal cell carcinoma Neg Hx     Squamous cell carcinoma Neg Hx

## 2023-11-19 NOTE — Unmapped (Signed)
 Patient Education        Breast Self-Exam: Care Instructions  Overview    A breast self-exam means regularly checking your breasts for lumps or changes. Depending on your health history, your doctor may tell you to do breast self-exams. These help you learn how your breasts normally look and feel. Most breast problems or changes are not because of cancer.  Breast self-exams don't take the place of a mammogram. Having regular breast exams by your doctor and mammograms can improve your chances of finding any problems with your breasts.  If you notice a change in your breast, tell your doctor. This may include any new lump, nipple discharge, or redness or a change in the skin's usual color.  Follow-up care is a key part of your treatment and safety. Be sure to make and go to all appointments, and call your doctor if you are having problems. It's also a good idea to know your test results and keep a list of the medicines you take.  How do you do a breast self-exam?  Try to set a time each month to do a step-by-step breast self-exam. If you have a menstrual period, the best time to check your breasts is usually 1 week after your period begins. Your breasts should not be tender then. If you don't have periods, you might do your exam on a day of the month that is easy to remember.  To check your breasts:  Remove all your clothes above the waist and lie down. When you are lying down, your breast tissue spreads evenly over your chest wall, which makes it easier to feel all your breast tissue.  Use the pads--not the fingertips--of the 3 middle fingers of your left hand to check your right breast. Move your fingers slowly in small coin-sized circles that overlap.  Use three levels of pressure to feel all of your breast tissue. Use light pressure to feel the tissue close to the skin surface. Use medium pressure to feel a little deeper. Use firm pressure to feel your tissue close to your breastbone and ribs. Use each pressure level to feel your breast tissue before moving on to the next spot.  Check the entire breast area and armpit.  Repeat this procedure for your left breast, using the pads of the 3 middle fingers of your right hand.  To check your breasts while in the shower:  Place one arm over your head, and lightly soap your breast on that side.  Using the pads of your fingers, gently move your hand over the entire breast area and armpit, feeling carefully for any lumps or changes.  Repeat for the other breast.  Have your doctor check anything you notice to see if you need further testing.  When should you see a doctor about breast changes?  If you notice any changes to the normal look and feel of your breasts, have them checked by a doctor. Changes may include:  Any new lump. It may or may not be painful to touch.  Unusual thick areas.  Discharge from your nipples if you aren't breastfeeding.  Any changes in the skin of your breasts or nipples, such as puckering or dimpling.  Redness or a change in the skin's usual color.  An unusual increase in the size of one breast.  One breast unusually lower than the other.  Remember that most breast problems or changes are caused by something other than cancer.  Where can you learn more?  Go to [facility] at https://[facility]chart.Armed forces logistics/support/administrative officer in the Menu. Enter P148 in the search box to learn more about Breast Self-Exam: Care Instructions.  Current as of: July 11, 2022  Content Version: 14.5  ?? 2024-2025 [place], [place].   Care instructions adapted under license by [facility]. If you have questions about a medical condition or this instruction, always ask your healthcare professional. Romayne Alderman, [facility], disclaims any warranty or liability for your use of this information.

## 2023-11-20 ENCOUNTER — Ambulatory Visit: Admit: 2023-11-20 | Discharge: 2023-11-20 | Payer: Medicaid (Managed Care)

## 2023-11-20 ENCOUNTER — Ambulatory Visit
Admit: 2023-11-20 | Discharge: 2023-11-20 | Payer: Medicaid (Managed Care) | Attending: Internal Medicine | Primary: Internal Medicine

## 2023-11-20 DIAGNOSIS — K760 Fatty (change of) liver, not elsewhere classified: Principal | ICD-10-CM

## 2023-11-20 DIAGNOSIS — K7460 Unspecified cirrhosis of liver: Principal | ICD-10-CM

## 2023-11-20 DIAGNOSIS — Z760 Encounter for issue of repeat prescription: Principal | ICD-10-CM

## 2023-11-20 DIAGNOSIS — E785 Hyperlipidemia, unspecified: Principal | ICD-10-CM

## 2023-11-20 LAB — COMPREHENSIVE METABOLIC PANEL
ALBUMIN: 4 g/dL (ref 3.4–5.0)
ALKALINE PHOSPHATASE: 56 U/L (ref 46–116)
ALT (SGPT): 22 U/L (ref 10–49)
ANION GAP: 10 mmol/L (ref 5–14)
AST (SGOT): 23 U/L (ref <=34)
BILIRUBIN TOTAL: 1.1 mg/dL (ref 0.3–1.2)
BLOOD UREA NITROGEN: 18 mg/dL (ref 9–23)
BUN / CREAT RATIO: 18
CALCIUM: 9.8 mg/dL (ref 8.7–10.4)
CHLORIDE: 105 mmol/L (ref 98–107)
CO2: 29 mmol/L (ref 20.0–31.0)
CREATININE: 1.02 mg/dL (ref 0.55–1.02)
EGFR CKD-EPI (2021) FEMALE: 62 mL/min/1.73m2 (ref >=60)
GLUCOSE RANDOM: 88 mg/dL (ref 70–179)
POTASSIUM: 4.3 mmol/L (ref 3.5–5.1)
PROTEIN TOTAL: 6.7 g/dL (ref 5.7–8.2)
SODIUM: 144 mmol/L (ref 135–145)

## 2023-11-20 LAB — CBC
HEMATOCRIT: 38 % (ref 34.0–44.0)
HEMOGLOBIN: 13.3 g/dL (ref 11.3–14.9)
MEAN CORPUSCULAR HEMOGLOBIN CONC: 34.9 g/dL (ref 32.0–36.0)
MEAN CORPUSCULAR HEMOGLOBIN: 30.1 pg (ref 25.9–32.4)
MEAN CORPUSCULAR VOLUME: 86.2 fL (ref 77.6–95.7)
MEAN PLATELET VOLUME: 8.4 fL (ref 6.8–10.7)
PLATELET COUNT: 159 10*9/L (ref 150–450)
RED BLOOD CELL COUNT: 4.41 10*12/L (ref 3.95–5.13)
RED CELL DISTRIBUTION WIDTH: 13.7 % (ref 12.2–15.2)
WBC ADJUSTED: 6.1 10*9/L (ref 3.6–11.2)

## 2023-11-20 LAB — AFP TUMOR MARKER: AFP-TUMOR MARKER: 4 ng/mL (ref <=8)

## 2023-11-20 LAB — PROTIME-INR
INR: 1.05
PROTIME: 12 s (ref 9.9–12.6)

## 2023-11-20 MED ORDER — ROSUVASTATIN 20 MG TABLET
ORAL_TABLET | Freq: Every day | ORAL | 1 refills | 90.00000 days
Start: 2023-11-20 — End: ?

## 2023-11-23 DIAGNOSIS — Z760 Encounter for issue of repeat prescription: Principal | ICD-10-CM

## 2023-11-23 DIAGNOSIS — E785 Hyperlipidemia, unspecified: Principal | ICD-10-CM

## 2023-11-23 MED ORDER — ROSUVASTATIN 20 MG TABLET
ORAL_TABLET | Freq: Every day | ORAL | 1 refills | 90.00000 days
Start: 2023-11-23 — End: ?

## 2023-11-26 MED ORDER — ROSUVASTATIN 20 MG TABLET
ORAL_TABLET | Freq: Every day | ORAL | 1 refills | 90.00000 days
Start: 2023-11-26 — End: ?

## 2023-11-26 NOTE — Unmapped (Signed)
 [facility], [place]        Referring Provider:  Sol Potts, MD  [address]  [address]  [place],  [place] [address]     Primary Care Provider:  Orpha Yancey Skeens, MD    Other Specialist(s):       PATIENT PROFILE:        Stacy Fox is a 63 y.o. female (DOB: Jan 30, 1961) who is seen in return for MASH  cirrhosis.     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  Nodular liver seen at [facility] which she tolerated without decompensation.  MetS risk factors.  Low MELD. Patient is doing well overall without decompensation events. She has lost 20 lbs since starting Ozempic , but weight now stable. She tolerated knee replacement in March 2025    MELD 3.0: 8 at 11/20/2023 11:51 AM  MELD-Na: 8 at 11/20/2023 11:51 AM  Calculated from:  Serum Creatinine: 1.02 mg/dL at 0/0/7974 88:48 AM  Serum Sodium: 144 mmol/L (Using max of 137 mmol/L) at 11/20/2023 11:51 AM  Total Bilirubin: 1.1 mg/dL at 0/0/7974 88:48 AM  Serum Albumin: 4 g/dL (Using max of 3.5 g/dL) at 0/0/7974 88:48 AM  INR(ratio): 1.05 at 11/20/2023 11:51 AM  Age at listing (hypothetical): 63 years  Sex: Female at 11/20/2023 11:51 AM         PLAN:       -We discussed the role of diet and exercise in the treatment of nonalcoholic fatty liver disease.  Multiple diets that showed efficacy and weight loss in the treatment of nonalcoholic fatty liver disease, thus I do not recommend one specific diet, however overall reduced calorie diet instead.  Exercise recommendations include a goal of 30-45 minutes of aerobic exercise 5 times a week.  I stressed that this is the goal rather than a starting point however and a prudent increase in physical activity is necessary.  -We discussed the role of clinical trials in the treatment of nonalcoholic fatty liver disease and potential participation in a clinical trial.  The patient was receptive to this and agreed to be contacted by a research coordinator if an appropriate clinical trial becomes available.   -Low Na diet, increase protein  -Continue GLP1RA  -RTC 6 months or for screening for clinical trial        CHIEF COMPLAINT: cirrhosis    HISTORY OF PRESENT ILLNESS: This is a 63 y.o. year old female with a nodular liver seen during [facility].  She has been previously told she had a fatty liver  No ETOH use.  BMI 44, HTN, Lipids, DM.  No over liver symptoms or decompensatin events.  No jaundice, encephalopathy, ascites or GI bleeding. Has been told she has varices on imaging but apparently non on upper endoscopy done several years ago.  Was able to lose 50lbs on phentermine , but gained it all back during pandemic.  Has also been on ozempic  in the past.   Interval history  Last seen by Dr. Sue 09/2022.  She has lost 40 lbs on Ozempic  which she has been taking for the past year. She recently gained a couple of pounds due to the holidays. She uses a air fryer and tries to avoid butter and carbohydrates. She does not consume any soft drinks and drinks alcohol once per year. She enjoys walking, but is limited by pain in the left knee. She is scheduled for knee replacement in March 2025 at [facility]. No jaundice, encephalopathy, ascites or  GI bleeding.     Interval history  No jaundice, encephalopathy, ascites or GI bleeding.  Weight stable.  Tolerated Knee replacement.      REVIEW OF SYSTEMS:     The balance of 12 systems reviewed is negative except as noted in the HPI.     PAST MEDICAL HISTORY:    Past Medical History:   Diagnosis Date    Acute post-traumatic headache     Breast cyst     Cervical spondylosis     Cervicalgia     Chronic pain syndrome     Colon polyp     Degeneration of cervical intervertebral disc     Diabetes mellitus    (CMS-HCC)     Disturbance of skin sensation     Fatty liver     Fatty liver     GERD (gastroesophageal reflux disease)     Hyperlipidemia     Legally blind     Morbid obesity with BMI of 40.0-44.9, adult (CMS-HCC)     Osteoarthritis     Pain in joint, shoulder region     Pain in thoracic spine Pneumonia     Rectal bleeding     Sleep apnea     uses cpap    Sprain of neck     Varices of spleen        PAST SURGICAL HISTORY:    Past Surgical History:   Procedure Laterality Date    BREAST BIOPSY Left     1990's- benign    BREAST LUMPECTOMY Left     Left breast 1990's- Benign cyst removed per patient    CARPAL TUNNEL RELEASE Bilateral     CHOLECYSTECTOMY      03/18/2021    COLONOSCOPY      x 2 (patient thinks 1 polyp removed)    CYSTOSCOPY W/ URETERAL STENT PLACEMENT      kidney stone removed    ELBOW SURGERY Right     nerve release    ESOPHAGOGASTRODUODENOSCOPY      x2    EYE SURGERY Left     tighten left eye muscle    FINGER SURGERY Bilateral     trigger finger and both thumbs    FOOT SURGERY Left     bone shaved    GANGLION CYST EXCISION Left     left wrist (multiple times removed)    HAND SURGERY Right     laceration repair to right hand    HERNIA REPAIR Midline 03/18/2021    JOINT REPLACEMENT Right 2018    right knee replacement    PR COLONOSCOPY FLX DX W/COLLJ SPEC WHEN PFRMD Left 12/30/2014    Procedure: COLONOSCOPY, FLEXIBLE, PROXIMAL TO SPLENIC FLEXURE; DIAGNOSTIC, W/WO COLLECTION SPECIMEN BY BRUSH OR WASH;  Surgeon: Vanderbilt Krystal Pan, MD;  Location: Endo Procedures [facility];  Service: General Surgery    PR COLONOSCOPY FLX DX W/COLLJ SPEC WHEN PFRMD N/A 10/21/2020    Procedure: COLONOSCOPY, FLEXIBLE, PROXIMAL TO SPLENIC FLEXURE; DIAGNOSTIC, W/WO COLLECTION SPECIMEN BY BRUSH OR WASH;  Surgeon: Ellaree Sergio Bolus, MD;  Location: ENDO OR [facility];  Service: General Surgery    PR KNEE SCOPE,MED/LAT MENISECTOMY Right 09/02/2014    Procedure: ARTHROSCOPY, KNEE; W/MENISECT(MED/LAT, INCL MENISCAL SHAVE) W/DEBRIDE/SHAVE ARTICULAR CART(CHONDROPLASTY);  Surgeon: Vinie JAYSON Fonder, MD;  Location: [facility];  Service: Orthopedics    PR LAP,CHOLECYSTECTOMY/GRAPH N/A 03/18/2021    Procedure: LAPAROSCOPIC CHOLECYSTECTOMY POSS CHOLANGIOGRAPHY;  Surgeon: Duwaine Rollo Jumper, MD;  Location: OR [facility];  Service: General Surgery  PR UPPER GI ENDOSCOPY,DIAGNOSIS N/A 05/26/2021    Procedure: UGI ENDO, INCLUDE ESOPHAGUS, STOMACH, & DUODENUM &/OR JEJUNUM; DX W/WO COLLECTION SPECIMN, BY BRUSH OR WASH;  Surgeon: Sim Donna Magnuson, MD;  [facility];  Service: Gastroenterology    PR UPPER GI ENDOSCOPY,DIAGNOSIS N/A 09/20/2023    Procedure: UGI ENDO, INCLUDE ESOPHAGUS, STOMACH, & DUODENUM &/OR JEJUNUM; DX W/WO COLLECTION SPECIMN, BY BRUSH OR WASH;  Surgeon: Gwenyth Prentice Agent, MD;  [facility]: [facility];  Service: Gastroenterology    ROTATOR CUFF REPAIR      left - 2013 and right - 2015       MEDICATIONS:      Current Outpatient Medications:     aspirin  81 MG chewable tablet, Chew 1 tablet (81 mg total) daily., Disp: 30 tablet, Rfl: 6    blood sugar diagnostic (ON CALL EXPRESS TEST STRIP) Strp, Use to check blood sugar as directed with insulin 3 times a day and for symptoms of high or low blood sugar., Disp: 100 each, Rfl: 6    calcium-vitamin D 500 mg-5 mcg (200 unit) per tablet, Take 2 tablets by mouth in the morning and 2 tablets in the evening. Take with meals., Disp: , Rfl:     lancets (ON CALL LANCET) 30 gauge Misc, Use to check blood sugar as directed with insulin 3 times a day & for symptoms of high or low blood sugar., Disp: 100 each, Rfl: 5    lancing device Misc, Use as directed, Disp: 1 each, Rfl: 0    rosuvastatin  (CRESTOR ) 20 MG tablet, Take 1 tablet (20 mg total) by mouth daily., Disp: 90 tablet, Rfl: 1    semaglutide  (OZEMPIC ) 2 mg/dose (8 mg/3 mL) PnIj, Inject 2 mg under the skin every seven (7) days., Disp: 3 mL, Rfl: 4    ALLERGIES:    Ranitidine    SOCIAL HISTORY:    Social History     Socioeconomic History    Marital status: Married     Spouse name: None    Number of children: 1    Years of education: None    Highest education level: None   Tobacco Use    Smoking status: Never     Passive exposure: Never    Smokeless tobacco: Never   Vaping Use    Vaping status: Never Used Substance and Sexual Activity    Alcohol use: Not Currently    Drug use: No    Sexual activity: Not Currently     Partners: Male   Other Topics Concern    Do you use sunscreen? No    Tanning bed use? No    Are you easily burned? No    Excessive sun exposure? No    Blistering sunburns? No     Social Drivers of Psychologist, prison and probation services Strain: Low Risk  (04/07/2022)    Overall Financial Resource Strain (CARDIA)     Difficulty of Paying Living Expenses: Not hard at all   Food Insecurity: Low Risk  (05/23/2023)    Received from [facility]    Hunger Vital Sign     Within the past 12 months, you worried that your food would run out before you got money to buy more: Never true     Within the past 12 months, the food you bought just didn't last and you didn't have money to get more. : Never true   Transportation Needs: No Transportation Needs (05/23/2023)  Received from [employer]     In the past 12 months, has lack of reliable transportation kept you from medical appointments, meetings, work or from getting things needed for daily living? : No   Physical Activity: Insufficiently Active (09/16/2020)    Exercise Vital Sign     Days of Exercise per Week: 4 days     Minutes of Exercise per Session: 10 min   Stress: No Stress Concern Present (03/27/2023)    [employer] of Occupational Health - Occupational Stress Questionnaire     Feeling of Stress : Not at all   Social Connections: Socially Integrated (04/07/2022)    Social Connection and Isolation Panel     Frequency of Communication with Friends and Family: Twice a week     Frequency of Social Gatherings with Friends and Family: Twice a week     Attends Religious Services: 1 to 4 times per year     Active Member of [employer] or Organizations: No     Attends Banker Meetings: 1 to 4 times per year     Marital Status: Married   Housing: Low Risk  (05/23/2023)    Received from [employer] Stability Vital Sign     What is your living situation today?: I have a steady place to live     Think about the place you live. Do you have problems with any of the following? Choose all that apply:: None/None on this list       FAMILY HISTORY:    family history includes Cancer (age of onset: 27) in her sister; Cancer (age of onset: 1) in her sister; Emphysema in her father; Heart disease in her mother; Leukemia in her sister; Ovarian cancer in her sister.      VITAL SIGNS:    BP 120/77  - Pulse 75  - Temp 36.7 ??C (98 ??F) (Tympanic)  - Ht 149.9 cm (4' 11.02)  - Wt 91.3 kg (201 lb 3.2 oz)  - LMP  (LMP Unknown)  - SpO2 97%  - BMI 40.62 kg/m??   Body mass index is 40.62 kg/m??.    PHYSICAL EXAM:    Wt Readings from Last 12 Encounters:   11/20/23 91.3 kg (201 lb 3.2 oz)   11/19/23 91.9 kg (202 lb 9.6 oz)   09/20/23 87.5 kg (193 lb)   05/25/23 88.9 kg (196 lb)   04/03/23 90.5 kg (199 lb 9.6 oz)   03/27/23 89.7 kg (197 lb 12.8 oz)   02/13/23 89.6 kg (197 lb 8 oz)   12/05/22 87.7 kg (193 lb 4.8 oz)   10/03/22 89.3 kg (196 lb 14.4 oz)   04/07/22 91.8 kg (202 lb 6.4 oz)   04/04/22 90.7 kg (200 lb)   02/15/22 94.1 kg (207 lb 8 oz)      Exam:  Constitutional:   Alert, oriented x 3, no acute distress, well nourished   Mental Status:   Thought organized, appropriate affect, normal fluent speech.   HEENT:   PEERL, conjunctiva clear, anicteric, oropharynx clear, neck supple, no LAD.   Respiratory: Clear to auscultation, and percussion to the bases, unlabored breathing.     Cardiac: Regular rate and rhythm normal S1 and S2, no murmur.      Abdomen: Soft, non-distended, non-tender, no organomegaly or masses.     Perianal/Rectal Exam Not performed.     Extremities:   No edema, well perfused.   Musculoskeletal: No joint swelling  or tenderness noted, no deformities.     Skin: Pustular rash on face, mild     Neuro: No focal deficits.          DIAGNOSTIC STUDIES:  I have reviewed all pertinent diagnostic studies, including:    GI Procedures:  - EGD 05/2021: normal esophagus, stomach, and duodenum.    Radiographic studies:  - Abdominal U/S 09/2022: heterogenous liver, no focal liver lesion identified    Laboratory results:  Lab Results   Component Value Date    WBC 6.1 11/20/2023    HGB 13.3 11/20/2023    HCT 38.0 11/20/2023    PLT 159 11/20/2023       Lab Results   Component Value Date    NA 144 11/20/2023    K 4.3 11/20/2023    CL 105 11/20/2023    CO2 29.0 11/20/2023    BUN 18 11/20/2023    CREATININE 1.02 11/20/2023    GLU 88 11/20/2023    CALCIUM 9.8 11/20/2023       Lab Results   Component Value Date    BILITOT 1.1 11/20/2023    BILIDIR 0.14 03/16/2021    PROT 6.7 11/20/2023    ALBUMIN 4.0 11/20/2023    ALT 22 11/20/2023    AST 23 11/20/2023    ALKPHOS 56 11/20/2023       Lab Results   Component Value Date    PT 12.0 11/20/2023    INR 1.05 11/20/2023    APTT 23.0 04/05/2016

## 2023-11-27 DIAGNOSIS — K7581 Nonalcoholic steatohepatitis (NASH): Principal | ICD-10-CM

## 2023-11-29 DIAGNOSIS — K7581 Nonalcoholic steatohepatitis (NASH): Principal | ICD-10-CM

## 2023-11-29 MED FILL — OZEMPIC 2 MG/DOSE (8 MG/3 ML) SUBCUTANEOUS PEN INJECTOR: SUBCUTANEOUS | 28 days supply | Qty: 3 | Fill #4

## 2023-12-13 DIAGNOSIS — Z1231 Encounter for screening mammogram for malignant neoplasm of breast: Principal | ICD-10-CM

## 2023-12-13 DIAGNOSIS — Z006 Encounter for examination for normal comparison and control in clinical research program: Principal | ICD-10-CM

## 2023-12-19 ENCOUNTER — Ambulatory Visit: Admit: 2023-12-19 | Discharge: 2023-12-20 | Payer: Medicaid (Managed Care)

## 2023-12-19 DIAGNOSIS — K760 Fatty (change of) liver, not elsewhere classified: Principal | ICD-10-CM

## 2023-12-19 DIAGNOSIS — Z006 Encounter for examination for normal comparison and control in clinical research program: Principal | ICD-10-CM

## 2023-12-19 NOTE — Unmapped (Signed)
 Addended by: Naveena Eyman on: 12/19/2023 12:37 PM     Modules accepted: Level of Service

## 2023-12-19 NOTE — Unmapped (Signed)
 [place] DIVISION OF GASTROENTEROLOGY AND HEPATOLOGY  [facility]    FIBROSCAN will be performed to assess hepatic fibrosis (scarring) in order to stage this patient's liver disease. This will assist with evaluating the natural course of the disease and will provide important information regarding prognosis, duration of therapy, and potential response to treatment. This information will also help assess risk for hepatocellular carcinoma and need for liver cancer surveillance.    FibroscanProcedure:  After obtaining verbal consent, the patient was placed in a supine position. Physical characteristics, body habitus, and landmarks were assessed to establish appropriate midaxillary intercostal space for probe placement.    FibroScan 630 Expert Serial # [id number]    Probe  [x]  M+ Serial # [id number]  []  XL+ Serial # [id number]    Main Etiology of Liver Disease:  [] Hepatitis C (HCV)     [] Hepatitis B (HBV)  [] Alcohol  [] NAFLD     [] PBC       [] PSC  [] Autoimmune Hepatitis       [] Elevated hepatic enzymes       [x] Other  Research                                  Skin to liver capsule distance and liver parenchyma were accessed during the entire examination with the FibroScan probe. The patient was instructed to breathe normally and to abstain from sudden movements during the procedure, allowing for random measurements of liver stiffness. 50Hz  shear wave pulses were applied and the resulting shear wave and propagation speed detected with a 3.5 MHz ultrasonic signal, using the FibroScan probe. At least ten Shear Waves were produced; individual measurements of each shear wave were calculated. Patient tolerated the procedure well and was discharged without incident.    FibroScan score __10.9__ kPa   IQR/Med __27__ %   CAP __266__ dB/m     Test performed by: Honore Montclair, RN      --------------------------------------------------------------------  RESULTS REPORT - Physician???s interpretation    Estimation of the stage of liver fibrosis (Metavir Score): The results of the Liver Stiffness Score are consistent with the following liver fibrosis stage:    []  F0-F1  []  F2   [x]  F3   [] F4           GENERAL RECOMMENDATIONS ACCORDING TO THE STAGE OF LIVER FIBROSIS.    F0-F1: No-minimal fibrosis. The risk of progression to advanced fibrosis and cirrhosis is low. If the cause of liver disease is not removed, a 1-2 yr follow-up study is recommended. F2: Significant fibrosis. There is a moderate risk of progression to cirrhosis. If the cause of liver disease is not removed, a follow-up study in 12 months is recommended.  F3: Advanced (pre-cirrhotic stage). The risk of progression to cirrhosis is high. Imaging studies to rule out hepatocellular carcinoma should be considered. Efforts to remove the cause of liver disease are highly recommended.  F4: Cirrhosis. There is significant risk of portal hypertension and esophageal varices. An upper endoscopy is recommended. Imaging studies for hepatocellular carcinoma screening are recommended.    Any and all FibroScan studies must be carefully evaluated, taking fully into account all individual measurement/scans, patient history and other factors. As with liver biopsy, any estimation of liver fibrosis may be subject to under or over staging due to sampling error. Any further medical or surgical intervention should be made only while fully considering the circumstances of this patient and in consultation with  this patient.    I have reviewed and interpreted the FIBROSCAN test results as described above.       F 3 fibrosis  S 2 (33-66%) steatosis    A. Donna Sue CLORE, MD, [facility]  Professor of Medicine  Division of Gastroenterology and Hepatology  [facility]  at [facility]  [address]  [id number]  [place], [place] [address]    Phone [phone]  Fax [phone]

## 2023-12-31 DIAGNOSIS — K760 Fatty (change of) liver, not elsewhere classified: Principal | ICD-10-CM

## 2024-01-04 DIAGNOSIS — J069 Acute upper respiratory infection, unspecified: Principal | ICD-10-CM

## 2024-01-04 MED ORDER — PREDNISONE 20 MG TABLET
ORAL_TABLET | Freq: Every day | ORAL | 0 refills | 0.00000 days
Start: 2024-01-04 — End: ?

## 2024-01-07 ENCOUNTER — Ambulatory Visit: Admitting: Nurse Practitioner

## 2024-01-07 ENCOUNTER — Encounter: Payer: Self-pay | Admitting: Nurse Practitioner

## 2024-01-07 VITALS — BP 101/64 | HR 73 | Temp 97.2°F | Ht 59.0 in | Wt 203.8 lb

## 2024-01-07 DIAGNOSIS — L237 Allergic contact dermatitis due to plants, except food: Secondary | ICD-10-CM | POA: Insufficient documentation

## 2024-01-07 DIAGNOSIS — R7303 Prediabetes: Secondary | ICD-10-CM | POA: Diagnosis not present

## 2024-01-07 DIAGNOSIS — Z23 Encounter for immunization: Secondary | ICD-10-CM

## 2024-01-07 DIAGNOSIS — E559 Vitamin D deficiency, unspecified: Secondary | ICD-10-CM | POA: Insufficient documentation

## 2024-01-07 DIAGNOSIS — E782 Mixed hyperlipidemia: Secondary | ICD-10-CM

## 2024-01-07 DIAGNOSIS — B372 Candidiasis of skin and nail: Secondary | ICD-10-CM | POA: Insufficient documentation

## 2024-01-07 DIAGNOSIS — Z0001 Encounter for general adult medical examination with abnormal findings: Secondary | ICD-10-CM | POA: Insufficient documentation

## 2024-01-07 DIAGNOSIS — K5900 Constipation, unspecified: Secondary | ICD-10-CM

## 2024-01-07 DIAGNOSIS — K760 Fatty (change of) liver, not elsewhere classified: Secondary | ICD-10-CM

## 2024-01-07 LAB — BAYER DCA HB A1C WAIVED: HB A1C (BAYER DCA - WAIVED): 5.1 % (ref 4.8–5.6)

## 2024-01-07 MED ORDER — PANTOPRAZOLE SODIUM 40 MG PO TBEC: 40.0000 mg | DELAYED_RELEASE_TABLET | Freq: Every day | ORAL | 0 refills | Status: AC

## 2024-01-07 MED ORDER — OYSTER SHELL CALCIUM W/D 500-5 MG-MCG PO TABS: 2.0000 | ORAL_TABLET | Freq: Two times a day (BID) | ORAL | 1 refills | Status: AC | PRN

## 2024-01-07 MED ORDER — PREDNISONE 10 MG PO TABS: 10.0000 mg | ORAL_TABLET | Freq: Every day | ORAL | 0 refills | Status: AC

## 2024-01-07 MED ORDER — ASPIRIN EC 81 MG PO TBEC: 81.0000 mg | DELAYED_RELEASE_TABLET | Freq: Every day | ORAL | 0 refills | Status: DC

## 2024-01-07 MED ORDER — CETIRIZINE HCL 10 MG PO TABS: 10.0000 mg | ORAL_TABLET | Freq: Every day | ORAL | 0 refills | Status: DC

## 2024-01-07 MED ORDER — NYSTATIN 100000 UNIT/GM EX POWD: 1.0000 | Freq: Three times a day (TID) | CUTANEOUS | 0 refills | Status: AC

## 2024-01-07 MED ORDER — PREDNISONE 20 MG TABLET
ORAL_TABLET | Freq: Every day | ORAL | 0 refills | 30.00000 days
Start: 2024-01-07 — End: ?

## 2024-01-07 NOTE — Progress Notes (Signed)
 Subjective:  Patient ID: Brenda Lamb, female    DOB: [birth year], 63 y.o.   MRN: [id number]  Patient Care Team: Deitra Morton Hummer, Nena, NP as PCP - General (Nurse Practitioner) Shaaron Lamar HERO, MD as Consulting Physician (Gastroenterology)   Chief Complaint:  Establish Care and skin infectoin (Yeast infection on chest and stomach for a week)   HPI: Brenda Lamb is a 63 y.o. female presenting on 01/07/2024 for Establish Care and skin infectoin (Yeast infection on chest and stomach for a week)   Discussed the use of AI scribe software for clinical note transcription with the patient, who gave verbal consent to proceed.  History of Present Illness Brenda Lamb is a 63 year old female who presents with a rash due to poison sumac exposure.  Brenda Lamb developed a rash after exposure to poison sumac while cutting branches last Sunday. The rash initially appeared as small dots and has since spread to Brenda Lamb bilateral legs, right side of Brenda Lamb chest, and arms. It is itchy, with some areas containing fluid-filled vesicles. Brenda Lamb has been using rubbing alcohol to dry the rash and was previously prescribed a medication for the rash and an antipruritic.  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 Brenda Lamb also has a history of prediabetes and is taking Crestor 20 mg daily, pantoprazole 20 mg daily, and an 81 mg baby aspirin daily. Brenda Lamb reports a vitamin D deficiency and takes 800 mg of vitamin D and 1000 mg of calcium daily due to abnormal bone density results.  Brenda Lamb family history is significant for cancer, with a sister who died of ovarian cancer and another sister who died of leukemia. Brenda Lamb reports being very proactive about mammograms and had signs concerning for cancer in Brenda Lamb early twenties.  No current cardiac issues or hypertension. Brenda Lamb experiences dry mouth and constipation has resolved, with regular bowel movements several times a day  without medication.       10 /27/2025    2:05 PM 12/11/2019    1:50 PM 04/16/2019    5:13 PM  GAD 7 : Generalized Anxiety Score  Nervous, Anxious, on Edge 0 0 0  Control/stop worrying 0 0 0  Worry too much - different things 0 0 0  Trouble relaxing 0 0 0  Restless 0 0 0  Easily annoyed or irritable 0 0 0  Afraid - awful might happen 0 0 0  Total GAD 7 Score 0 0 0  Anxiety Difficulty Not difficult at all  Not difficult at all     [facility] Visit from 01/07/2024 in [facility]  PHQ-9 Total Score 2     Relevant past medical, surgical, family, and social history reviewed and updated as indicated.  Allergies and medications reviewed and updated. Data reviewed: Chart in Epic.   Past Medical History:  Diagnosis Date   Acid reflux    Fatty liver 09/30/2019   Hyperlipidemia    Sleep apnea    Vitamin D deficiency     Past Surgical History:  Procedure Laterality Date   BIOPSY  02/02/2020   Procedure: BIOPSY;  Surgeon: Shaaron Lamar HERO, MD;  Location: [facility];  Service: Endoscopy;;   BREAST LUMPECTOMY     left breast lump   CARPAL TUNNEL RELEASE Bilateral    ELBOW SURGERY     nerve release right elbow   ESOPHAGEAL BANDING N/A 02/02/2020  Procedure: ESOPHAGEAL BANDING;  Surgeon: Shaaron Lamar HERO, MD;  Location: [facility];  Service: Endoscopy;  Laterality: N/A;   ESOPHAGOGASTRODUODENOSCOPY  10/2015   Dr. Zulema in [facility]: Esophageal nodule measuring 10 mm at 20 cm in the esophagus.  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  Esophagitis noted at 4 cm above the GE junction.  Gastritis.  8 mm gastric ulcer.  Multiple gastric biopsy showed chronic inflammation but no H. pylori.   ESOPHAGOGASTRODUODENOSCOPY  12/2015   Dr. Zulema at [facility]: Esophagitis at 4 cm above the GE junction, gastritis, gastric ulcer healed.  Esophageal and gastric biopsies benign    ESOPHAGOGASTRODUODENOSCOPY (EGD) WITH PROPOFOL  N/A 02/02/2020   Procedure: ESOPHAGOGASTRODUODENOSCOPY (EGD) WITH PROPOFOL ;  Surgeon: Shaaron Lamar HERO, MD;  Location: [facility];  Service: Endoscopy;  Laterality: N/A;  1:15pm   GANGLION CYST EXCISION     HAND SURGERY     trigger fingers   KNEE SURGERY     right knee arthoplasty   REPLACEMENT TOTAL KNEE Right 12/2017   REPLACEMENT TOTAL KNEE Left 05/16/2023   SHOULDER ARTHROSCOPY Bilateral    rotator cuff repairs    Social History   Socioeconomic History   Marital status: Married    Spouse name: Not on file   Number of children: Not on file   Years of education: Not on file   Highest education level: Not on file  Occupational History   Not on file  Tobacco Use   Smoking status: Never   Smokeless tobacco: Never  Vaping Use   Vaping status: Never Used  Substance and Sexual Activity   Alcohol use: Yes    Comment: social    Drug use: Never   Sexual activity: Not Currently    Birth control/protection: Post-menopausal  Other Topics Concern   Not on file  Social History Narrative   Not on file   Social Drivers of Health   Financial Resource Strain: Low Risk  (04/07/2022)   Received from [facility]   Overall Financial Resource Strain (CARDIA)    Difficulty of Paying Living Expenses: Not hard at all  Food Insecurity: Low Risk  (05/23/2023)   Received from [facility]   Hunger Vital Sign    Within the past 12 months, you worried that your food would run out before you got money to buy more: Never true    Within the past 12 months, the food you bought just didn't last and you didn't have money to get more. : Never true  Transportation Needs: No Transportation Needs (05/23/2023)   Received from [facility]    In the past 12 months, has lack of reliable transportation kept you from medical appointments, meetings, work or from getting things needed for daily living? : No  Physical Activity:  Insufficiently Active (09/16/2020)   Received from [facility]   Exercise Vital Sign    On average, how many days per week do you engage in moderate to strenuous exercise (like a brisk walk)?: 4 days    On average, how many minutes do you engage in exercise at this level?: 10 min  Stress: No Stress Concern Present (03/27/2023)   Received from [facility] - Occupational Stress Questionnaire    Feeling of Stress : Not at all  Social Connections: Socially Integrated (04/07/2022)   Received from [facility]   Social Connection and Isolation  Panel    In a typical week, how many times do you talk on the phone with family, friends, or neighbors?: Twice a week    How often do you get together with friends or relatives?: Twice a week    How often do you attend church or religious services?: 1 to 4 times per year    Do you belong to any clubs or organizations such as church groups, unions, fraternal or athletic groups, or school groups?: No    How often do you attend meetings of the clubs or organizations you belong to?: 1 to 4 times per year    Are you married, widowed, divorced, separated, never married, or living with a partner?: Married  Intimate Partner Violence: Not At Risk (09/20/2023)   Received from [facility]   Humiliation, Afraid, Rape, and Kick questionnaire    Within the last year, have you been afraid of your partner or ex-partner?: No    Within the last year, have you been humiliated or emotionally abused in other ways by your partner or ex-partner?: No    Within the last year, have you been kicked, hit, slapped, or otherwise physically hurt by your partner or ex-partner?: No    Within the last year, have you been raped or forced to have any kind of sexual activity by your partner or ex-partner?: No    Outpatient Encounter Medications as of 01/07/2024  Medication Sig   cetirizine (ZYRTEC ALLERGY) 10 MG tablet Take 1 tablet (10  mg total) by mouth daily.   clobetasol (OLUX) 0.05 % topical foam Apply topically 2 (two) times daily.   doxepin (SINEQUAN) 25 MG capsule Take 25 mg by mouth.   fluconazole (DIFLUCAN) 100 MG tablet Take 100 mg by mouth daily.   nystatin (MYCOSTATIN/NYSTOP) powder Apply 1 Application topically 3 (three) times daily.   predniSONE (DELTASONE) 10 MG tablet Take 1 tablet (10 mg total) by mouth daily with breakfast.   rosuvastatin  (CRESTOR ) 20 MG tablet Take 20 mg by mouth daily.   [DISCONTINUED] Calcium  Carb-Cholecalciferol (OYSTER SHELL CALCIUM  W/D) 500-5 MG-MCG TABS Take 2 tablets by mouth.   aspirin EC 81 MG tablet Take 1 tablet (81 mg total) by mouth daily.   Calcium  Carb-Cholecalciferol (OYSTER SHELL CALCIUM  W/D) 500-5 MG-MCG TABS Take 2 tablets by mouth 2 (two) times daily as needed.   pantoprazole  (PROTONIX ) 40 MG tablet Take 1 tablet (40 mg total) by mouth daily.   [DISCONTINUED] aspirin EC 81 MG tablet Take 81 mg by mouth daily. (Patient not taking: Reported on 01/07/2024)   [DISCONTINUED] pantoprazole  (PROTONIX ) 40 MG tablet Take 1 tablet (40 mg total) by mouth daily. (Patient not taking: Reported on 01/07/2024)   [DISCONTINUED] rosuvastatin  (CRESTOR ) 10 MG tablet Take 1 tablet (10 mg total) by mouth daily. (NEEDS TO BE SEEN BEFORE NEXT REFILL)   No facility-administered encounter medications on file as of 01/07/2024.    Allergies  Allergen Reactions   Ranitidine Swelling    Swollen lips.    Pertinent ROS per HPI, otherwise unremarkable      Objective:  BP 101/64   Pulse 73   Temp (!) 97.2 F (36.2 C) (Temporal)   Ht 4' 11 (1.499 m)   Wt 203 lb 12.8 oz (92.4 kg)   SpO2 97%   BMI 41.16 kg/m    Wt Readings from Last 3 Encounters:  01/07/24 203 lb 12.8 oz (92.4 kg)  03/09/20 231 lb 12.8 oz (105.1 kg)  01/30/20 231 lb (104.8 kg)  Physical Exam Vitals and nursing note reviewed.  Constitutional:      Appearance: Brenda Lamb is obese.  HENT:     Head: Normocephalic and  atraumatic.     Right Ear: Tympanic membrane, ear canal and external ear normal. There is no impacted cerumen.     Nose: Nose normal.     Mouth/Throat:     Mouth: Mucous membranes are moist.  Eyes:     General: No scleral icterus.    Extraocular Movements: Extraocular movements intact.     Conjunctiva/sclera: Conjunctivae normal.     Pupils: Pupils are equal, round, and reactive to light.  Neck:     Vascular: No carotid bruit.  Cardiovascular:     Heart sounds: Normal heart sounds.  Pulmonary:     Effort: Pulmonary effort is normal.     Breath sounds: Normal breath sounds.  Abdominal:     Palpations: Abdomen is soft.  Musculoskeletal:        General: Normal range of motion.     Cervical back: Normal range of motion and neck supple. No rigidity or tenderness.     Right lower leg: No edema.  Lymphadenopathy:     Cervical: No cervical adenopathy.  Skin:    General: Skin is warm and dry.     Findings: Rash present.  Neurological:     Mental Status: Brenda Lamb is alert and oriented to person, place, and time.  Psychiatric:        Mood and Affect: Mood normal.        Behavior: Behavior normal.        Thought Content: Thought content normal.        Judgment: Judgment normal.       Physical Exam      Results for orders placed or performed in visit on 03/09/20  Hepatic function panel   Collection Time: 03/09/20  3:49 PM  Result Value Ref Range   Total Protein 6.9 6.1 - 8.1 g/dL   Albumin 4.3 3.6 - 5.1 g/dL   Globulin 2.6 1.9 - 3.7 g/dL (calc)   AG Ratio 1.7 1.0 - 2.5 (calc)   Total Bilirubin 0.7 0.2 - 1.2 mg/dL   Bilirubin, Direct 0.1 0.0 - 0.2 mg/dL   Indirect Bilirubin 0.6 0.2 - 1.2 mg/dL (calc)   Alkaline phosphatase (APISO) 74 37 - 153 U/L   AST 41 (H) 10 - 35 U/L   ALT 23 6 - 29 U/L  INR/PT   Collection Time: 03/09/20  3:49 PM  Result Value Ref Range   INR 1.0    Prothrombin Time 10.6 9.0 - 11.5 sec  CBC w/Diff/Platelet   Collection Time: 03/09/20  3:49 PM   Result Value Ref Range   WBC 8.2 3.8 - 10.8 Thousand/uL   RBC 4.31 3.80 - 5.10 Million/uL   Hemoglobin 12.9 11.7 - 15.5 g/dL   HCT 61.4 64.9 - 54.9 %   MCV 89.3 80.0 - 100.0 fL   MCH 29.9 27.0 - 33.0 pg   MCHC 33.5 32.0 - 36.0 g/dL   RDW 87.0 88.9 - 84.9 %   Platelets 270 140 - 400 Thousand/uL   MPV 9.1 7.5 - 12.5 fL   Neutro Abs 5,207 1,500 - 7,800 cells/uL   Lymphs Abs 2,214 850 - 3,900 cells/uL   Absolute Monocytes 623 200 - 950 cells/uL   Eosinophils Absolute 123 15 - 500 cells/uL   Basophils Absolute 33 0 - 200 cells/uL   Neutrophils Relative % 63.5 %  Total Lymphocyte 27.0 %   Monocytes Relative 7.6 %   Eosinophils Relative 1.5 %   Basophils Relative 0.4 %  Basic Metabolic Panel (BMET)   Collection Time: 03/09/20  3:49 PM  Result Value Ref Range   Glucose, Bld 110 (H) 65 - 99 mg/dL   BUN 12 7 - 25 mg/dL   Creat 9.13 9.49 - 8.94 mg/dL   BUN/Creatinine Ratio NOT APPLICABLE 6 - 22 (calc)   Sodium 140 135 - 146 mmol/L   Potassium 3.9 3.5 - 5.3 mmol/L   Chloride 102 98 - 110 mmol/L   CO2 29 20 - 32 mmol/L   Calcium  9.4 8.6 - 10.4 mg/dL  Antinuclear Antib (ANA)   Collection Time: 03/09/20  3:49 PM  Result Value Ref Range   Anti Nuclear Antibody (ANA) NEGATIVE NEGATIVE  Anti-smooth muscle antibody, IgG   Collection Time: 03/09/20  3:49 PM  Result Value Ref Range   Actin (Smooth Muscle) Antibody (IGG) <20 <20 U  IgG, IgA, IgM   Collection Time: 03/09/20  3:49 PM  Result Value Ref Range   Immunoglobulin A 118 47 - 310 mg/dL   IgG (Immunoglobin G), Serum 936 600 - 1,640 mg/dL   IgM, Serum 43 (L) 50 - 300 mg/dL  IgE   Collection Time: 03/09/20  3:49 PM  Result Value Ref Range   IgE (Immunoglobulin E), Serum 82 <OR=114 kU/L       Pertinent labs & imaging results that were available during my care of the patient were reviewed by me and considered in my medical decision making.  Assessment & Plan:  Brenda Lamb was seen today for establish care and skin  infectoin.  Diagnoses and all orders for this visit:  Poison ivy dermatitis -     predniSONE (DELTASONE) 10 MG tablet; Take 1 tablet (10 mg total) by mouth daily with breakfast. -     cetirizine (ZYRTEC ALLERGY) 10 MG tablet; Take 1 tablet (10 mg total) by mouth daily.  Fatty liver -     pantoprazole  (PROTONIX ) 40 MG tablet; Take 1 tablet (40 mg total) by mouth daily. -     Calcium  Carb-Cholecalciferol (OYSTER SHELL CALCIUM  W/D) 500-5 MG-MCG TABS; Take 2 tablets by mouth 2 (two) times daily as needed.  Candidal intertrigo  Pre-diabetes -     Bayer DCA Hb A1c Waived  Mixed hyperlipidemia -     Lipid panel  Morbid obesity (HCC) -     Lipid panel -     Thyroid Panel With TSH  Vitamin D deficiency -     VITAMIN D 25 Hydroxy (Vit-D Deficiency, Fractures)  Constipation, unspecified constipation type -     pantoprazole  (PROTONIX ) 40 MG tablet; Take 1 tablet (40 mg total) by mouth daily.  Encounter for general adult medical examination with abnormal findings -     predniSONE (DELTASONE) 10 MG tablet; Take 1 tablet (10 mg total) by mouth daily with breakfast. -     cetirizine (ZYRTEC ALLERGY) 10 MG tablet; Take 1 tablet (10 mg total) by mouth daily. -     nystatin (MYCOSTATIN/NYSTOP) powder; Apply 1 Application topically 3 (three) times daily. -     VITAMIN D 25 Hydroxy (Vit-D Deficiency, Fractures) -     pantoprazole  (PROTONIX ) 40 MG tablet; Take 1 tablet (40 mg total) by mouth daily. -     aspirin EC 81 MG tablet; Take 1 tablet (81 mg total) by mouth daily. -     Calcium  Carb-Cholecalciferol (OYSTER SHELL CALCIUM  W/D)  500-5 MG-MCG TABS; Take 2 tablets by mouth 2 (two) times daily as needed. -     Bayer DCA Hb A1c Waived -     Lipid panel -     Thyroid Panel With TSH     Assessment and Plan Brenda Lamb is a 63 year old Caucasian female seen today to establish care, no acute distress Assessment & Plan Allergic contact dermatitis due to poison ivy/sumac exposure Pruritic rash with  vesicles on legs, chest, and arms. Oral steroids preferred. - Prescribe prednisone 10 mg twice daily with food for 10 days. - Discontinue doxepin, initiate Zyrtec 10 mg daily for pruritus. - Continue clobetasol cream as needed. - Advise using Dawn dish soap during showers.  Candidal intertrigo Intertrigo under breast, currently dry. No sores present. - Continue Diflucan as prescribed. - Prescribe medicated powder as needed.  Nonalcoholic steatohepatitis (NASH) Managed with Ozempic 2.0 mg. No new symptoms.  Prediabetes Managed with Ozempic. Order A1c  Vitamin D deficiency Bone density issues require calcium  and vitamin D supplementation. - Order vitamin D level. - Prescribe vitamin D supplement if high dose needed. - Prescribe calcium  supplement, advise OTC if not covered.  General Health Maintenance Mammogram postponed due to rash. Family history of ovarian cancer and leukemia. Discussed flu vaccination and dry mouth management. - Reschedule mammogram in two weeks. - Administer flu vaccine today. - Advise using hard candy for dry mouth management.  Labs: Lipid, TSH, A1c result pending   Health maintenance: Flu vaccine administered today; has mammogram appointment in 2 weeks Continue all other maintenance medications.  Follow up plan: Return in about 3 months (around 04/08/2024) for Chronic .   Continue healthy lifestyle choices, including diet (rich in fruits, vegetables, and lean proteins, and low in salt and simple carbohydrates) and exercise (at least 30 minutes of moderate physical activity daily).  Educational handout given for    Clinical References  Influenza (Flu) Vaccine (Inactivated or Recombinant): What You Need to Know Many vaccine information statements are available in Spanish and other languages. See promoage.com.br. 1. Why get vaccinated? Influenza vaccine can prevent influenza (flu). Flu is a contagious disease that spreads around the [place] every year, usually between October and May. Anyone can get the flu, but it is more dangerous for some people. Infants and young children, people 75 years and older, pregnant people, and people with certain health conditions or a weakened immune system are at greatest risk of flu complications. Pneumonia, bronchitis, sinus infections, and ear infections are examples of flu-related complications. If you have a medical condition, such as heart disease, cancer, or diabetes, flu can make it worse. Flu can cause fever and chills, sore throat, muscle aches, fatigue, cough, headache, and runny or stuffy nose. Some people may have vomiting and diarrhea, though this is more common in children than adults. In an average year, thousands of people in the [place]  die from flu, and many more are hospitalized. Flu vaccine prevents millions of illnesses and flu-related visits to the doctor each year. 2. Influenza vaccines CDC recommends everyone 6 months and older get vaccinated every flu season. Children 6 months through 70 years of age may need 2 doses during a single flu season. Everyone else needs only 1 dose each flu season. It takes about 2 weeks for protection to develop after vaccination. There are many flu viruses, and they are always changing. Each year a new flu vaccine is made to protect against the influenza viruses believed to be likely to cause disease  in the upcoming flu season. Even when the vaccine doesn't exactly match these viruses, it may still provide some protection. Influenza vaccine does not cause flu. Influenza vaccine may be given at the same time as other vaccines. 3. Talk with your health care provider Tell your vaccination provider if the person getting the vaccine: Has had an allergic reaction after a previous dose of influenza vaccine, or has any severe, life-threatening allergies Has ever had Guillain-Barr Syndrome (also called GBS) In some cases, your health care  provider may decide to postpone influenza vaccination until a future visit. Influenza vaccine can be administered at any time during pregnancy. People who are or will be pregnant during influenza season should receive inactivated influenza vaccine. People with minor illnesses, such as a cold, may be vaccinated. People who are moderately or severely ill should usually wait until they recover before getting influenza vaccine. Your health care provider can give you more information. 4. Risks of a vaccine reaction Soreness, redness, and swelling where the shot is given, fever, muscle aches, and headache can happen after influenza vaccination. There may be a very small increased risk of Guillain-Barr Syndrome (GBS) after inactivated influenza vaccine (the flu shot). Young children who get the flu shot along with pneumococcal vaccine (PCV13) and/or DTaP vaccine at the same time might be slightly more likely to have a seizure caused by fever. Tell your health care provider if a child who is getting flu vaccine has ever had a seizure. People sometimes faint after medical procedures, including vaccination. Tell your provider if you feel dizzy or have vision changes or ringing in the ears. As with any medicine, there is a very remote chance of a vaccine causing a severe allergic reaction, other serious injury, or death. 5. What if there is a serious problem? An allergic reaction could occur after the vaccinated person leaves the clinic. If you see signs of a severe allergic reaction (hives, swelling of the face and throat, difficulty breathing, a fast heartbeat, dizziness, or weakness), call 9-1-1 and get the person to the nearest hospital. For other signs that concern you, call your health care provider. Adverse reactions should be reported to the Vaccine Adverse Event Reporting System (VAERS). Your health care provider will usually file this report, or you can do it yourself. Visit the VAERS website at  www.vaers.lagents.no or call (478) 758-3274. VAERS is only for reporting reactions, and VAERS staff members do not give medical advice. 6. The National Vaccine Injury Compensation Program The [employer] Vaccine Injury Compensation Program (VICP) is a federal program that was created to compensate people who may have been injured by certain vaccines. Claims regarding alleged injury or death due to vaccination have a time limit for filing, which may be as short as two years. Visit the VICP website at spiritualword.at or call 412-522-2199 to learn about the program and about filing a claim. 7. How can I learn more? Ask your health care provider. Call your local or state health department. Visit the website of the Food and Drug Administration (FDA) for vaccine package inserts and additional information at finderlist.no. Contact the Centers for Disease Control and Prevention (CDC): Call 203-636-7937 (1-800-CDC-INFO) or Visit CDC's website at biotechroom.com.cy. Source: CDC Vaccine Information Statement Inactivated Influenza Vaccine (10/17/2019) This same material is available at footballexhibition.com.br for no charge. This information is not intended to replace advice given to you by your health care provider. Make sure you discuss any questions you have with your health care provider. Document Revised: 06/14/2022 Document  Reviewed: 03/20/2022 Elsevier Patient Education  2024 Elsevier Inc. Nonalcoholic Fatty Liver Disease Diet, Adult Nonalcoholic fatty liver disease is a condition that causes fat to build up in and around the liver. The disease makes it harder for the liver to work the way that it should. Eating a healthy diet of fruits, vegetables, whole grains, lean proteins, and limiting added sugar and fats can help to keep nonalcoholic fatty liver disease under control. It can also help to prevent or improve conditions that are related to the disease, such as heart  disease, diabetes, high blood pressure, obesity, and high cholesterol. Along with regular exercise, this diet: Promotes weight loss. Helps to control blood sugar levels. Helps to improve the way that the body uses insulin. What are tips for following this plan? Reading food labels Always check food labels for: The amount of saturated fat in a food. You should limit how much saturated fat you eat. Saturated fat is found in foods that come from animals, including meat and dairy products such as butter, cheese, and whole milk. The amount of fiber in a food. You should choose high-fiber foods such as fruits, vegetables, and whole grains. Try to get 25-30 grams (g) of fiber a day. Added sugar. Avoid foods with a high amount of added sugar and high fructose corn syrup. Avoid sweetened soft drinks, sweetened tea, lemonade, sports drinks, and juices that are not 100% juice. Aim for foods with less than 5 grams of added sugar. Every 4 grams of added sugar is 1 teaspoon (tsp) of sugar per serving. Cooking When cooking, use heart-healthy oils that are high in monounsaturated fats. These include olive oil, canola oil, and avocado oil. Limit frying or deep-frying foods. Cook foods using healthy methods such as baking, boiling, steaming, and grilling instead. Meal planning You may want to keep track of how many calories you eat and drink. Eating the right amount of calories will help you achieve a healthy weight. Meeting with a dietitian can help you get started. Limit how often you eat takeout and fast food. These foods are usually very high in fat, salt, and sugar. Use the glycemic index (GI) to plan your meals. The index tells you how quickly a food will raise your blood sugar. Choose low-GI foods. Low-GI foods have a GI less than 55. These foods take a longer time to raise blood sugar. A dietitian can help you pick foods that are lower on the GI scale. Try to include some meals each week that replace meat  with beans or legumes. Add fish 2-3 times a week, especially heart healthy oily fishes like salmon, sardines, trout, tuna, or mackerel. Lifestyle You may want to follow a Mediterranean diet. This diet includes a lot of vegetables, lean meats or fish, nuts and seeds, whole grains, fruits, and healthy oils and fats. What foods can I eat?  Fruits Apples. Bananas. Pears. Grapes. Papaya. Plums. Kiwi. Grapefruit. Cherries. Strawberries. Vegetables Lettuce. Spinach. Peas. Beets. Cauliflower. Cabbage. Broccoli. Carrots. Tomatoes. Squash. Eggplant. Herbs. Peppers. Onions. Cucumbers. Brussels sprouts. Yams and sweet potatoes. Grains Whole wheat or whole-grain foods, including breads, crackers, cereals, and pasta. Stone-ground whole wheat. Unsweetened oatmeal. Bulgur. Barley. Quinoa. Brown or wild rice. Corn or whole wheat flour tortillas. Meats and other proteins Lean meats. Poultry. Tofu. Seafood and shellfish. Beans. Lentils. Dairy Low-fat or fat-free dairy products, such as yogurt, cottage cheese, or cheese. Beverages Water . Sugar-free drinks. Tea. Coffee. Low-fat or skim milk. Milk alternatives, such as unsweetened soy, oat, or almond  milk. Real fruit juice. Fats and oils Avocado. Canola or olive oil. Nuts and nut butters. Seeds. Seasonings and condiments Mustard. Relish. Low-fat, low-sugar ketchup and barbecue sauce. Low-fat or fat-free mayonnaise. Sweets and desserts Sugar-free sweets. The items listed above may not be all the foods and drinks you can have. Talk to a dietitian to learn more. What foods should I limit or avoid? Grains White rice. Pasta. Breads. Meats and other proteins Limit red meat to 1-2 times a week. Dairy Full-fat dairy. Fats and oils Palm oil and coconut oil. Fried foods. Other foods Processed foods. Foods that contain a lot of salt (sodium) or added sugar. Sweets and desserts Sweets that contain sugar. Bakery items such as cookies, cakes, and other  pastries. Beverages Sweetened drinks, such as sweet tea, milkshakes, iced sweet drinks, and sodas. Alcohol. The items listed above may not be all the foods and drinks you should avoid. Talk to a dietitian to learn more. Where to find more information The [employer] of Diabetes and Digestive and Kidney Diseases: stagesync.si This information is not intended to replace advice given to you by your health care provider. Make sure you discuss any questions you have with your health care provider. Document Revised: 12/12/2021 Document Reviewed: 12/12/2021 Elsevier Patient Education  2024 [employer] Inc. Dyslipidemia Dyslipidemia is an imbalance of waxy, fat-like substances (lipids) in the blood. The body needs lipids in small amounts. Dyslipidemia often involves a high level of cholesterol or triglycerides, which are types of lipids. Common forms of dyslipidemia include: High levels of LDL cholesterol. LDL is the type of cholesterol that causes fatty deposits (plaques) to build up in the blood vessels that carry blood away from the heart (arteries). Low levels of HDL cholesterol. HDL cholesterol is the type of cholesterol that protects against heart disease. High levels of HDL remove the LDL buildup from arteries. High levels of triglycerides. Triglycerides are a fatty substance in the blood that is linked to a buildup of plaques in the arteries. What are the causes? There are two main types of dyslipidemia: primary and secondary. Primary dyslipidemia is caused by changes (mutations) in genes that are passed down through families (inherited). These mutations cause several types of dyslipidemia. Secondary dyslipidemia may be caused by various risk factors that can lead to the disease, such as lifestyle choices and certain medical conditions. What increases the risk? You are more likely to develop this condition if you are an older man or if you are a woman who has gone through menopause. Other  risk factors include: Having a family history of dyslipidemia. Taking certain medicines, including birth control pills, steroids, some diuretics, and beta-blockers. Eating a diet high in saturated fat. Smoking cigarettes or excessive alcohol intake. Having certain medical conditions such as diabetes, polycystic ovary syndrome (PCOS), kidney disease, liver disease, or hypothyroidism. Not exercising regularly. Being overweight or obese with too much belly fat. What are the signs or symptoms? In most cases, dyslipidemia does not usually cause any symptoms. In severe cases, very high lipid levels can cause: Fatty bumps under the skin (xanthomas). A white or gray ring around the black center (pupil) of the eye. Very high triglyceride levels can cause inflammation of the pancreas (pancreatitis). How is this diagnosed? Your health care provider may diagnose dyslipidemia based on a routine blood test (fasting blood test). Because most people do not have symptoms of the condition, this blood testing (lipid profile) is done on adults age 60 and older and is repeated every 4-6  years. This test checks: Total cholesterol. This measures the total amount of cholesterol in your blood, including LDL cholesterol, HDL cholesterol, and triglycerides. A healthy number is below 200 mg/dL (4.82 mmol/L). LDL cholesterol. The target number for LDL cholesterol is different for each person, depending on individual risk factors. A healthy number is usually below 100 mg/dL (7.40 mmol/L). Ask your health care provider what your LDL cholesterol should be. HDL cholesterol. An HDL level of 60 mg/dL (8.44 mmol/L) or higher is best because it helps to protect against heart disease. A number below 40 mg/dL (8.96 mmol/L) for men or below 50 mg/dL (8.70 mmol/L) for women increases the risk for heart disease. Triglycerides. A healthy triglyceride number is below 150 mg/dL (8.30 mmol/L). If your lipid profile is abnormal, your health  care provider may do other blood tests. How is this treated? Treatment depends on the type of dyslipidemia that you have and your other risk factors for heart disease and stroke. Your health care provider will have a target range for your lipid levels based on this information. Treatment for dyslipidemia starts with lifestyle changes, such as diet and exercise. Your health care provider may recommend that you: Get regular exercise. Make changes to your diet. Quit smoking if you smoke. Limit your alcohol intake. If diet changes and exercise do not help you reach your goals, your health care provider may also prescribe medicine to lower lipids. The most commonly prescribed type of medicine lowers your LDL cholesterol (statin drug). If you have a high triglyceride level, your provider may prescribe another type of drug (fibrate) or an omega-3 fish oil supplement, or both. Follow these instructions at home: Eating and drinking  Follow instructions from your health care provider or dietitian about eating or drinking restrictions. Eat a healthy diet as told by your health care provider. This can help you reach and maintain a healthy weight, lower your LDL cholesterol, and raise your HDL cholesterol. This may include: Limiting your calories, if you are overweight. Eating more fruits, vegetables, whole grains, fish, and lean meats. Limiting saturated fat, trans fat, and cholesterol. Do not drink alcohol if: Your health care provider tells you not to drink. You are pregnant, may be pregnant, or are planning to become pregnant. If you drink alcohol: Limit how much you have to: 0-1 drink a day for women. 0-2 drinks a day for men. Know how much alcohol is in your drink. In the [place] one drink equals one 12 oz bottle of beer (355 mL), one 5 oz glass of wine (148 mL), or one 1 oz glass of hard liquor (44 mL). Activity Get regular exercise. Start an exercise and strength training program as told by your  health care provider. Ask your health care provider what activities are safe for you. Your health care provider may recommend: 30 minutes of aerobic activity 4-6 days a week. Brisk walking is an example of aerobic activity. Strength training 2 days a week. General instructions Do not use any products that contain nicotine or tobacco. These products include cigarettes, chewing tobacco, and vaping devices, such as e-cigarettes. If you need help quitting, ask your health care provider. Take over-the-counter and prescription medicines only as told by your health care provider. This includes supplements. Keep all follow-up visits. This is important. Contact a health care provider if: You are having trouble sticking to your exercise or diet plan. You are struggling to quit smoking or to control your use of alcohol. Summary Dyslipidemia often involves a  high level of cholesterol or triglycerides, which are types of lipids. Treatment depends on the type of dyslipidemia that you have and your other risk factors for heart disease and stroke. Treatment for dyslipidemia starts with lifestyle changes, such as diet and exercise. Your health care provider may prescribe medicine to lower lipids. This information is not intended to replace advice given to you by your health care provider. Make sure you discuss any questions you have with your health care provider. Document Revised: 09/30/2021 Document Reviewed: 05/03/2020 Elsevier Patient Education  2025 [employer]. Obesity, Adult Obesity is the condition of having too much total body fat. Being overweight or obese means that your weight is greater than what is considered healthy for your body size. Obesity is determined by a measurement called BMI (body mass index). BMI is an estimate of body fat and is calculated from height and weight. For adults, a BMI of 30 or higher is considered obese. Obesity can lead to other health concerns and major illnesses,  including: Stroke. Coronary artery disease (CAD). Type 2 diabetes. Some types of cancer, including cancers of the colon, breast, uterus, and gallbladder. High blood pressure (hypertension). High cholesterol. Gallbladder stones. Obesity can also contribute to: Osteoarthritis. Sleep apnea. Infertility problems. What are the causes? Common causes of this condition include: Eating daily meals that are high in calories, sugar, and fat. Drinking high amounts of sugar-sweetened beverages, such as soft drinks. Being born with genes that may make you more likely to become obese. Having a medical condition that causes obesity, including: Hypothyroidism. Polycystic ovarian syndrome (PCOS). Binge-eating disorder. Cushing syndrome. Taking certain medicines, such as steroids, antidepressants, and seizure medicines. Not being physically active (sedentary lifestyle). Not getting enough sleep. What increases the risk? The following factors may make you more likely to develop this condition: Having a family history of obesity. Living in an area with limited access to: [place], recreation centers, or sidewalks. Healthy food choices, such as grocery stores and farmers' markets. What are the signs or symptoms? The main sign of this condition is having too much body fat. How is this diagnosed? This condition is diagnosed based on: Your BMI. If you are an adult with a BMI of 30 or higher, you are considered obese. Your waist circumference. This measures the distance around your waistline. Your skinfold thickness. Your health care provider may gently pinch a fold of your skin and measure it. You may have other tests to check for underlying conditions. How is this treated? Treatment for this condition often includes changing your lifestyle. Treatment may include some or all of the following: Dietary changes. This may include developing a healthy meal plan. Regular physical activity. This may include  activity that causes your heart to beat faster (aerobic exercise) and strength training. Work with your health care provider to design an exercise program that works for you. Medicine to help you lose weight if you are unable to lose one pound a week after six weeks of healthy eating and more physical activity. Treating conditions that cause the obesity (underlying conditions). Surgery. Surgical options may include gastric banding and gastric bypass. Surgery may be done if: Other treatments have not helped to improve your condition. You have a BMI of 40 or higher. You have life-threatening health problems related to obesity. Follow these instructions at home: Eating and drinking  Follow recommendations from your health care provider about what you eat and drink. Your health care provider may advise you to: Limit fast food, sweets, and  processed snack foods. Choose low-fat options, such as low-fat milk instead of whole milk. Eat five or more servings of fruits or vegetables every day. Choose healthy foods when you eat out. Keep low-fat snacks available. Limit sugary drinks, such as soda, fruit juice, sweetened iced tea, and flavored milk. Drink enough water  to keep your urine pale yellow. Do not follow a fad diet. Fad diets can be unhealthy and even dangerous. Other healthful choices include: Eat at home more often. This gives you more control over what you eat. Learn to read food labels. This will help you understand how much food is considered one serving. Learn what a healthy serving size is. Physical activity Exercise regularly, as told by your health care provider. Most adults should get up to 150 minutes of moderate-intensity exercise every week. Ask your health care provider what types of exercise are safe for you and how often you should exercise. Warm up and stretch before being active. Cool down and stretch after being active. Rest between periods of activity. Lifestyle Work  with your health care provider and a dietitian to set a weight-loss goal that is healthy and reasonable for you. Limit your screen time. Find ways to reward yourself that do not involve food. Do not drink alcohol if: Your health care provider tells you not to drink. You are pregnant, may be pregnant, or are planning to become pregnant. If you drink alcohol: Limit how much you have to: 0-1 drink a day for women. 0-2 drinks a day for men. Know how much alcohol is in your drink. In the [place] one drink equals one 12 oz bottle of beer (355 mL), one 5 oz glass of wine (148 mL), or one 1 oz glass of hard liquor (44 mL). General instructions Keep a weight-loss journal to keep track of the food you eat and how much exercise you get. Take over-the-counter and prescription medicines only as told by your health care provider. Take vitamins and supplements only as told by your health care provider. Consider joining a support group. Your health care provider may be able to recommend a support group. Pay attention to your mental health as obesity can lead to depression or self esteem issues. Keep all follow-up visits. This is important. Contact a health care provider if: You are unable to meet your weight-loss goal after six weeks of dietary and lifestyle changes. You have trouble breathing. Summary Obesity is the condition of having too much total body fat. Being overweight or obese means that your weight is greater than what is considered healthy for your body size. Work with your health care provider and a dietitian to set a weight-loss goal that is healthy and reasonable for you. Exercise regularly, as told by your health care provider. Ask your health care provider what types of exercise are safe for you and how often you should exercise. This information is not intended to replace advice given to you by your health care provider. Make sure you discuss any questions you have with your health care  provider. Document Revised: 10/05/2020 Document Reviewed: 10/05/2020 Elsevier Patient Education  2024 Elsevier Inc. Itchy Skin Blisters (Dyshidrotic Eczema): What to Know Dyshidrotic eczema, also called pompholyx, is a type of skin condition. It causes very itchy blisters on the hands and feet. It's more common before age 82, but it can affect people of any age. There's no cure, but treatments can help relieve symptoms. What are the causes? The cause of this condition isn't known. What  increases the risk? You're more likely to get this condition if: You wash your hands a lot. You have a personal or family history of eczema, allergies, asthma, or hay fever. You have allergies to metals like nickel or cobalt. You work with skin irritants like detergents or cement. You smoke. You're often stressed. You have an immune system condition. What are the signs or symptoms? Symptoms may come and go and often affect your hands or feet. Symptoms include: Severe itching, often before blisters show up. Blisters that can form suddenly. At first, the blisters may form near the fingertips. In severe cases, blisters may grow to large blister masses. Blisters go away in 2-3 weeks. This is followed by a less itchy and dry phase. Pain and swelling. Cracks or long, narrow openings (fissures) in the skin. Severe dryness. Ridges on the nails. How is this diagnosed? This condition may be diagnosed based on: Symptoms. Physical exam. Medical history. Skin scrapings to rule out fungal infections. Testing a swab of fluid for bacteria. A biopsy. A small piece of skin is removed and checked for infection or to rule out other conditions. Skin patch tests that use allergen patches on your back to check for allergic reactions. You may need to see a skin specialist called a dermatologist. This specialist can help diagnose and treat this condition. How is this treated? There's no cure for this condition, but  treatment can help relieve symptoms. Your health care provider may suggest: Avoiding allergens, irritants, or triggers that make your symptoms worse. You may need to: Use different soaps or lotions. Avoid hot weather or places where you'll sweat a lot. Learn stress management techniques, like relaxation and exercise. Follow diet changes that your provider recommends. Soothing your skin by: Using a clean, damp towel on the affected area. Taking baths with a special salt called aluminum acetate. Medicines such as: Medicine to lessen itching (antihistamines). Medicine to put on your skin to lessen swelling and irritation (corticosteroid creams or ointments). Immunosuppressant medicines. These are prescribed in severe cases. Antibiotic medicine if there's a skin infection. Light therapy, also called phototherapy. This is where you put your affected skin under ultraviolet (UV) light to lessen itchiness and inflammation. Follow these instructions at home: Bathing and skin care  Wash your skin gently. After bathing or washing your hands, pat your skin dry. Avoid rubbing. Take off your jewelry before bathing. If your skin under the jewelry stays wet, blisters may form or get worse. Apply cool compresses as told by your provider. To do this: Soak a clean towel in cool water . Squeeze out the water  until the towel is damp. Place the towel on the affected skin for 20 minutes, 2-3 times a day. Let the water  partially air dry, and then put on lotion. Use mild soaps, cleaners, and lotions that don't contain dyes, perfumes, or irritants. Keep your skin hydrated. To do this: Take warm baths or showers. Avoid hot water . Put on lotion within 3 minutes of bathing to lock in moisture. Medicines Take or apply your medicines only as told. If you were given antibiotics, take or apply them as told. Do not stop using them even if you start to feel better. General instructions Use skin creams or lotions as  told. Avoid triggers and allergens that make your symptoms worse. Keep fingernails short to avoid scratching open the skin. Use waterproof gloves to protect your hands when doing work that keeps your hands wet for a long time. Limit how often you wear  socks or shoes. If you have to wear socks, wear cotton socks that absorb moisture. Choose loose-fitting shoes. Do not smoke, vape, or use nicotine or tobacco. Keep all follow-up visits to make sure your treatment plan is working. Contact a health care provider if: You have symptoms that don't go away. You have signs of infection, such as: Crusting, pus, or a bad smell. More redness, swelling, or pain. More warmth in the affected area. Your skin has red streaks that are painful. This information is not intended to replace advice given to you by your health care provider. Make sure you discuss any questions you have with your health care provider. Document Revised: 08/01/2022 Document Reviewed: 08/01/2022 Elsevier Patient Education  2024 Else[employer] Inc.  The above assessment and management plan was discussed with the patient. The patient verbalized understanding of and has agreed to the management plan. Patient is aware to call the clinic if they develop any new symptoms or if symptoms persist or worsen. Patient is aware when to return to the clinic for a follow-up visit. Patient educated on when it is appropriate to go to the emergency department.    Camala Talwar [place] Thompson, DNP [facility] [address] [place], [place] [address] [phone]

## 2024-01-08 ENCOUNTER — Ambulatory Visit: Payer: Self-pay | Admitting: Nurse Practitioner

## 2024-01-08 DIAGNOSIS — E559 Vitamin D deficiency, unspecified: Secondary | ICD-10-CM

## 2024-01-08 LAB — LIPID PANEL
Chol/HDL Ratio: 3.5 ratio (ref 0.0–4.4)
Cholesterol, Total: 138 mg/dL (ref 100–199)
HDL: 40 mg/dL (ref >39)
LDL Chol Calc (NIH): 65 mg/dL (ref 0–99)
Triglycerides: 196 mg/dL — ABNORMAL HIGH (ref 0–149)
VLDL Cholesterol Cal: 33 mg/dL (ref 5–40)

## 2024-01-08 LAB — THYROID PANEL WITH TSH
Free Thyroxine Index: 1.5 (ref 1.2–4.9)
T3 Uptake Ratio: 22 % — ABNORMAL LOW (ref 24–39)
T4, Total: 6.7 ug/dL (ref 4.5–12.0)
TSH: 1.95 u[IU]/mL (ref 0.450–4.500)

## 2024-01-08 LAB — VITAMIN D 25 HYDROXY (VIT D DEFICIENCY, FRACTURES): Vit D, 25-Hydroxy: 29 ng/mL — ABNORMAL LOW (ref 30.0–100.0)

## 2024-01-08 MED ORDER — VITAMIN D3 50 MCG (2000 UT) PO CAPS
2000.0000 [IU] | ORAL_CAPSULE | Freq: Every day | ORAL | 1 refills | Status: AC
Start: 2024-01-08 — End: ?

## 2024-01-10 DIAGNOSIS — K7581 Nonalcoholic steatohepatitis (NASH): Principal | ICD-10-CM

## 2024-01-10 NOTE — Progress Notes (Signed)
 [facility] Procedure Request    Referring Provider:  Parnell Levan Lewis, ANP     Date of Review:  01/10/2024    Reviewing Provider:  Arleta Render, MD     Requested Site:  Liver (nontargeted)    Reason for Request:  Elevated liver enzymes    Past Medical History:  Past Medical History[1]    Imaging reviewed:  No recent relevant imaging available for review    Recommended Imaging Modality:  Ultrasound    Procedure Location:  Main campus, HBO, or EVC    Anticoagulation hold: Yes; ASA 81mg     Specimen Media:  Formalin    Comments:  Biopsy (non-targeted) is for a study ([facility]-US --[id number]).  Per protocol, please ensure 2 cm in length sample.  Please score by NAS, Brunt criteria.  Send sample in formalin to [facility] as usual.  Please call [phone] for any questions.            [1]   Past Medical History:  Diagnosis Date    Acute post-traumatic headache     Breast cyst     Cervical spondylosis     Cervicalgia     Chronic pain syndrome     Colon polyp     Degeneration of cervical intervertebral disc     Diabetes mellitus    (CMS-HCC)     Disturbance of skin sensation     Fatty liver     Fatty liver     GERD (gastroesophageal reflux disease)     Hyperlipidemia     Legally blind     Morbid obesity with BMI of 40.0-44.9, adult (CMS-HCC)     Osteoarthritis     Pain in joint, shoulder region     Pain in thoracic spine     Pneumonia     Rectal bleeding     Sleep apnea     uses cpap    Sprain of neck     Varices of spleen

## 2024-01-11 NOTE — Telephone Encounter (Signed)
 01/11/24 OP APPROVED US  MAIN Rm 4/2 or [facility] RESEARCH Liver Bx by Barbette / HOLD ASA 81 mg 5 DAYS PRIOR 01/10/24(SG) Sched Date: wk of Nov 10// on 01/11/24 MW APPROVED to schedule Rm 4 due to RESEARCH, scheduled Bx as directed bt Team w pt, pt verbalized understanding to HOLD ASA 5 DAYS PRIOR, discussed pre procedure instructions, confirmed family as driver, confirmed RESEARCH Bx. LINKED TO RESEARCH STUDY.(SG)

## 2024-01-14 MED ORDER — OZEMPIC 2 MG/DOSE (8 MG/3 ML) SUBCUTANEOUS PEN INJECTOR
SUBCUTANEOUS | 4 refills | 28.00000 days | Status: CP
Start: 2024-01-14 — End: ?
  Filled 2024-01-15: qty 3, 28d supply, fill #0

## 2024-01-22 NOTE — Telephone Encounter (Signed)
 VIR pre liver biopsy prep call completed. Reviewed to register at 0820 on ground floor [facility] then proceed [facility].  Informed of no show/late cancellation policy. NPO guidelines reviewed. Pt OK to take sips of clear liquids with all AM meds.  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 Made aware of visitation policy.  Pt verbalized understanding. All questions answered.     Last dose ASA 01/16/2024.

## 2024-01-25 ENCOUNTER — Inpatient Hospital Stay: Admit: 2024-01-25 | Discharge: 2024-01-25 | Payer: Medicaid (Managed Care)

## 2024-01-25 DIAGNOSIS — K7581 Nonalcoholic steatohepatitis (NASH): Principal | ICD-10-CM

## 2024-01-25 LAB — COMPREHENSIVE METABOLIC PANEL
ALBUMIN: 3.9 g/dL (ref 3.4–5.0)
ALKALINE PHOSPHATASE: 52 U/L (ref 46–116)
ALT (SGPT): 21 U/L (ref 10–49)
ANION GAP: 11 mmol/L (ref 5–14)
AST (SGOT): 22 U/L (ref <=34)
BILIRUBIN TOTAL: 0.7 mg/dL (ref 0.3–1.2)
BLOOD UREA NITROGEN: 19 mg/dL (ref 9–23)
BUN / CREAT RATIO: 19
CALCIUM: 9 mg/dL (ref 8.7–10.4)
CHLORIDE: 106 mmol/L (ref 98–107)
CO2: 27 mmol/L (ref 20.0–31.0)
CREATININE: 0.99 mg/dL (ref 0.55–1.02)
EGFR CKD-EPI (2021) FEMALE: 64 mL/min/1.73m2 (ref >=60)
GLUCOSE RANDOM: 101 mg/dL (ref 70–179)
POTASSIUM: 3.9 mmol/L (ref 3.4–4.8)
PROTEIN TOTAL: 6.7 g/dL (ref 5.7–8.2)
SODIUM: 144 mmol/L (ref 135–145)

## 2024-01-25 LAB — CBC
HEMATOCRIT: 38.8 % (ref 34.0–44.0)
HEMOGLOBIN: 13.2 g/dL (ref 11.3–14.9)
MEAN CORPUSCULAR HEMOGLOBIN CONC: 33.9 g/dL (ref 32.0–36.0)
MEAN CORPUSCULAR HEMOGLOBIN: 30.3 pg (ref 25.9–32.4)
MEAN CORPUSCULAR VOLUME: 89.4 fL (ref 77.6–95.7)
MEAN PLATELET VOLUME: 7.9 fL (ref 6.8–10.7)
PLATELET COUNT: 176 10*9/L (ref 150–450)
RED BLOOD CELL COUNT: 4.34 10*12/L (ref 3.95–5.13)
RED CELL DISTRIBUTION WIDTH: 14 % (ref 12.2–15.2)
WBC ADJUSTED: 7.6 10*9/L (ref 3.6–11.2)

## 2024-01-25 LAB — PROTIME-INR
INR: 0.98
PROTIME: 11.2 s (ref 9.9–12.6)

## 2024-01-25 MED ADMIN — midazolam (VERSED) injection: INTRAVENOUS | @ 15:00:00 | Stop: 2024-01-25

## 2024-01-25 MED ADMIN — lidocaine (PF) (XYLOCAINE-MPF) 10 mg/mL (1 %) injection: INTRADERMAL | @ 15:00:00 | Stop: 2024-01-25

## 2024-01-25 MED ADMIN — fentaNYL (PF) (SUBLIMAZE) injection: INTRAVENOUS | @ 15:00:00 | Stop: 2024-01-25

## 2024-01-25 MED ADMIN — oxyCODONE (ROXICODONE) immediate release tablet 5 mg: 5 mg | ORAL | @ 16:00:00 | Stop: 2024-01-25

## 2024-01-25 NOTE — H&P (Cosign Needed)
 Assessment/Plan:    Stacy Fox is a 63 y.o. female who will undergo non-targeted liver bx in Interventional Radiology.    --This procedure has been fully reviewed with the patient/patient???s authorized representative. The risks, benefits and alternatives have been explained, and the patient/patient???s authorized representative has consented to the procedure.  --The patient will accept blood products in an emergent situation.  --The patient does not have a Do Not Resuscitate order in effect.    HPI: Stacy Fox is a 63 y.o. female with elevated liver enzymes who presents for non-targeted liver bx.     Allergies: Allergies[1]    Medications:  ASA - last dose 01/16/24    ASA Grade: ASA 2 - Patient with mild systemic disease with no functional limitations    PSH: Past Surgical History[2]    PMH: Past Medical History[3]    PE:    Vitals:    01/25/24 0818   BP: 116/72   Pulse: 66   Resp: 16   Temp: 36.9 ??C (98.4 ??F)   SpO2: 95%     General: WD, WN female in NAD.   HEENT: Normocephalic, atraumatic.   Lungs: Respirations nonlabored  Mallampati Class:  Class II        Morene Daring, MD  01/25/2024, 9:26 AM               [1]   Allergies  Allergen Reactions    Ranitidine Swelling     Swollen lips.   [2]   Past Surgical History:  Procedure Laterality Date    BREAST BIOPSY Left     1990's- benign    BREAST LUMPECTOMY Left     Left breast 1990's- Benign cyst removed per patient    CARPAL TUNNEL RELEASE Bilateral     CHOLECYSTECTOMY      03/18/2021    COLONOSCOPY      x 2 (patient thinks 1 polyp removed)    CYSTOSCOPY W/ URETERAL STENT PLACEMENT      kidney stone removed    ELBOW SURGERY Right     nerve release    ESOPHAGOGASTRODUODENOSCOPY      x2    EYE SURGERY Left     tighten left eye muscle    FINGER SURGERY Bilateral     trigger finger and both thumbs    FOOT SURGERY Left     bone shaved    GANGLION CYST EXCISION Left     left wrist (multiple times removed)    HAND SURGERY Right     laceration repair to right hand    HERNIA REPAIR Midline 03/18/2021    JOINT REPLACEMENT Right 2018    right knee replacement    PR COLONOSCOPY FLX DX W/COLLJ SPEC WHEN PFRMD Left 12/30/2014    Procedure: COLONOSCOPY, FLEXIBLE, PROXIMAL TO SPLENIC FLEXURE; DIAGNOSTIC, W/WO COLLECTION SPECIMEN BY BRUSH OR WASH;  Surgeon: Vanderbilt Krystal Pan, MD;  Location: Endo Procedures [facility];  Service: General Surgery    PR COLONOSCOPY FLX DX W/COLLJ SPEC WHEN PFRMD N/A 10/21/2020    Procedure: COLONOSCOPY, FLEXIBLE, PROXIMAL TO SPLENIC FLEXURE; DIAGNOSTIC, W/WO COLLECTION SPECIMEN BY BRUSH OR WASH;  Surgeon: Ellaree Sergio Bolus, MD;  Location: ENDO OR [facility];  Service: General Surgery    PR KNEE SCOPE,MED/LAT MENISECTOMY Right 09/02/2014    Procedure: ARTHROSCOPY, KNEE; W/MENISECT(MED/LAT, INCL MENISCAL SHAVE) W/DEBRIDE/SHAVE ARTICULAR CART(CHONDROPLASTY);  Surgeon: Vinie JAYSON Fonder, MD;  Location: [facility];  Service: Orthopedics    PR LAP,CHOLECYSTECTOMY/GRAPH N/A 03/18/2021    Procedure: LAPAROSCOPIC CHOLECYSTECTOMY POSS  CHOLANGIOGRAPHY;  Surgeon: Duwaine Rollo Jumper, MD;  Location: OR [facility];  Service: General Surgery    PR UPPER GI ENDOSCOPY,DIAGNOSIS N/A 05/26/2021    Procedure: UGI ENDO, INCLUDE ESOPHAGUS, STOMACH, & DUODENUM &/OR JEJUNUM; DX W/WO COLLECTION SPECIMN, BY BRUSH OR WASH;  Surgeon: Sim Donna Magnuson, MD;  [facility];  Service: Gastroenterology    PR UPPER GI ENDOSCOPY,DIAGNOSIS N/A 09/20/2023    Procedure: UGI ENDO, INCLUDE ESOPHAGUS, STOMACH, & DUODENUM &/OR JEJUNUM; DX W/WO COLLECTION SPECIMN, BY BRUSH OR WASH;  Surgeon: Gwenyth Prentice Agent, MD;  [facility];  Service: Gastroenterology    ROTATOR CUFF REPAIR      left - 2013 and right - 2015   [3]   Past Medical History:  Diagnosis Date    Acute post-traumatic headache     Breast cyst     Cervical spondylosis     Cervicalgia     Chronic pain syndrome     Colon polyp     Degeneration of cervical intervertebral disc     Diabetes mellitus (CMS-HCC)     Disturbance of skin sensation     Fatty liver     Fatty liver     GERD (gastroesophageal reflux disease)     Hyperlipidemia     Legally blind     Morbid obesity with BMI of 40.0-44.9, adult (CMS-HCC)     Osteoarthritis     Pain in joint, shoulder region     Pain in thoracic spine     Pneumonia     Rectal bleeding     Sleep apnea     uses cpap    Sprain of neck     Varices of spleen

## 2024-01-25 NOTE — Op Note (Cosign Needed)
 [facility] [place] INTERVENTIONAL RADIOLOGY - Operative Note     VIR Post-Procedure Note    Procedure Name: Percutaneous liver biopsy     Pre-Op Diagnosis: Elevated liver enzymes    Post-Op Diagnosis: Same as pre-operative diagnosis    VIR Providers    Attending: Diannia Tedra Hicks, MD  Fellow: Lela Slates, MD    Description of procedure: Successful nontargeted percutaneous liver biopsy using ultrasound guidance. 2 cores taken and sent to pathology in formalin.     Sedation: Moderate    Estimated Blood Loss: approximately <2 mL  Complications: None    See detailed procedure note with images in PACS [facility]).    The patient tolerated the procedure well without incident or complication and left the room in stable condition.    Plan:  - Bedrest 2 hours    Lela Slates, MD  01/25/2024 10:32 AM

## 2024-01-26 NOTE — Telephone Encounter (Signed)
 VIR post call placed by VIR RN. Reached patient. Patient states is doing well, and expresses no concerns requiring intervention at this time. Patient also states is able to contact VIR if procedure related needs arise.

## 2024-01-29 ENCOUNTER — Other Ambulatory Visit: Payer: Self-pay | Admitting: *Deleted

## 2024-01-29 DIAGNOSIS — L237 Allergic contact dermatitis due to plants, except food: Secondary | ICD-10-CM

## 2024-01-29 DIAGNOSIS — Z0001 Encounter for general adult medical examination with abnormal findings: Secondary | ICD-10-CM

## 2024-02-14 ENCOUNTER — Inpatient Hospital Stay: Admit: 2024-02-14 | Discharge: 2024-02-14 | Payer: Medicaid (Managed Care)

## 2024-02-21 NOTE — Telephone Encounter (Signed)
 Hepatology Clinic Pharmacist Note    Primary Hepatology provider: Dr. Sue  Diagnosis: MASH  Fibrosis: Cirrhosis (Nodular liver seen at [facility]); F3 (10.9 kPa), 266 dB/m on 12/19/23    Ozempic  restart date: 11/28/21  Current regimen: Ozempic  2mg  weekly since 02/20/22  PA renewed and approved til 06/07/24    Pharmacy: [facility]) Pharmacy [phone] option #4, then option 4    Reports injecting Ozempic  2mg  on Mondays on abdomen. Rotates sites.     Patient Reports:   Adverse effects: denies  Early satiety: Yes  Increased satiation: Yes    Other Weight-Related Considerations:   Major changes to diet? Denies but husband is seeing nutritionist at weight loss clinic  Major changes to physical activity? knee surgery 05/2023. Still having mobility issue    Weight Management Progress:  Goal weight: 89.4kg (196.6 lbs) - initial goal met; New goal=80.5kg (177 lbs)  Initial weight: 99.3 kg (219 lb)  as of 08/03/2021  Last weight:  97.4 kg (214 lb 12.8 oz) as of 11/28/21   212 lbs per pt report on 12/23/21  94.1kg (207 lb 8oz) on 02/15/22 at appt with Dr. Tobie  90.7 kg (200 lb)  as of 04/04/2022 at appt with Dr. Tobie  197 lbs per pt report on 05/22/22  89.3 kg (196 lb 14.4 oz) 10/03/22  87.7 kg (193 lb 4.8 oz)  12/05/22  192 lbs per pt report on 06/19/23  Current weight:  38.78 kg/m??; 87.1 kg (192 lb) 01/25/24   Percentage weight loss: -12.3% (12.2 kg, 26.8 lbs)   Since initial visit:     Pt reports she's tolerating 2mg  with no issues. She is continuing to see weight loss benefit on Ozempic . Will continue current dose. Discussed the importance of medication adherence, lifestyle modifications including good sleep hygiene, and consistency to meet weight management goals.  Follow up with Dr. Sue as scheduled 05/27/24.      Slater Blunt, Pharm D., BCPS, BCGP, CPP  [facility]  [address]  [place], [place] [address]  [phone]

## 2024-02-25 MED FILL — OZEMPIC 2 MG/DOSE (8 MG/3 ML) SUBCUTANEOUS PEN INJECTOR: SUBCUTANEOUS | 28 days supply | Qty: 3 | Fill #1

## 2024-03-19 ENCOUNTER — Inpatient Hospital Stay: Admit: 2024-03-19 | Discharge: 2024-03-19 | Payer: Medicaid (Managed Care)

## 2024-03-24 ENCOUNTER — Encounter: Payer: Self-pay | Admitting: *Deleted

## 2024-03-24 MED FILL — OZEMPIC 2 MG/DOSE (8 MG/3 ML) SUBCUTANEOUS PEN INJECTOR: SUBCUTANEOUS | 28 days supply | Qty: 3 | Fill #2

## 2024-03-30 ENCOUNTER — Other Ambulatory Visit: Payer: Self-pay | Admitting: Nurse Practitioner

## 2024-03-30 DIAGNOSIS — Z0001 Encounter for general adult medical examination with abnormal findings: Secondary | ICD-10-CM

## 2024-04-08 ENCOUNTER — Ambulatory Visit: Admitting: Nurse Practitioner

## 2024-04-10 ENCOUNTER — Ambulatory Visit: Payer: Self-pay | Admitting: Nurse Practitioner

## 2024-04-17 MED FILL — OZEMPIC 2 MG/DOSE (8 MG/3 ML) SUBCUTANEOUS PEN INJECTOR: SUBCUTANEOUS | 28 days supply | Qty: 3 | Fill #3
